# Patient Record
Sex: Male | Born: 1950
Health system: Southern US, Community
[De-identification: ages and names within clinical notes are randomized; demographics above are authoritative.]

## PROBLEM LIST (undated history)

## (undated) DIAGNOSIS — G4730 Sleep apnea, unspecified: Secondary | ICD-10-CM

## (undated) DIAGNOSIS — K649 Unspecified hemorrhoids: Secondary | ICD-10-CM

## (undated) DIAGNOSIS — I499 Cardiac arrhythmia, unspecified: Secondary | ICD-10-CM

## (undated) DIAGNOSIS — N189 Chronic kidney disease, unspecified: Secondary | ICD-10-CM

## (undated) DIAGNOSIS — I4891 Unspecified atrial fibrillation: Secondary | ICD-10-CM

## (undated) DIAGNOSIS — M1990 Unspecified osteoarthritis, unspecified site: Secondary | ICD-10-CM

## (undated) DIAGNOSIS — I10 Essential (primary) hypertension: Secondary | ICD-10-CM

## (undated) DIAGNOSIS — G629 Polyneuropathy, unspecified: Secondary | ICD-10-CM

## (undated) DIAGNOSIS — E119 Type 2 diabetes mellitus without complications: Secondary | ICD-10-CM

## (undated) DIAGNOSIS — K5900 Constipation, unspecified: Secondary | ICD-10-CM

## (undated) HISTORY — PX: ROTATOR CUFF REPAIR: SHX139

## (undated) HISTORY — PX: CARDIOVASCULAR STRESS TEST: SHX262

## (undated) HISTORY — PX: FOOT TENDON SURGERY: SHX958

---

## 2015-09-20 DIAGNOSIS — Z8042 Family history of malignant neoplasm of prostate: Secondary | ICD-10-CM | POA: Insufficient documentation

## 2015-09-20 DIAGNOSIS — R351 Nocturia: Secondary | ICD-10-CM | POA: Insufficient documentation

## 2015-09-20 DIAGNOSIS — N138 Other obstructive and reflux uropathy: Secondary | ICD-10-CM | POA: Insufficient documentation

## 2015-09-20 DIAGNOSIS — N401 Enlarged prostate with lower urinary tract symptoms: Secondary | ICD-10-CM

## 2016-02-19 DIAGNOSIS — E119 Type 2 diabetes mellitus without complications: Secondary | ICD-10-CM | POA: Diagnosis not present

## 2016-04-04 DIAGNOSIS — K921 Melena: Secondary | ICD-10-CM | POA: Diagnosis not present

## 2016-04-10 DIAGNOSIS — K649 Unspecified hemorrhoids: Secondary | ICD-10-CM | POA: Diagnosis not present

## 2016-04-10 DIAGNOSIS — I10 Essential (primary) hypertension: Secondary | ICD-10-CM | POA: Diagnosis not present

## 2016-04-10 DIAGNOSIS — K648 Other hemorrhoids: Secondary | ICD-10-CM | POA: Diagnosis not present

## 2016-04-10 DIAGNOSIS — F329 Major depressive disorder, single episode, unspecified: Secondary | ICD-10-CM | POA: Diagnosis not present

## 2016-04-10 DIAGNOSIS — F419 Anxiety disorder, unspecified: Secondary | ICD-10-CM | POA: Diagnosis not present

## 2016-04-10 DIAGNOSIS — D128 Benign neoplasm of rectum: Secondary | ICD-10-CM | POA: Diagnosis not present

## 2016-04-10 DIAGNOSIS — E1121 Type 2 diabetes mellitus with diabetic nephropathy: Secondary | ICD-10-CM | POA: Diagnosis not present

## 2016-04-10 DIAGNOSIS — K921 Melena: Secondary | ICD-10-CM | POA: Diagnosis not present

## 2016-04-10 DIAGNOSIS — Z87891 Personal history of nicotine dependence: Secondary | ICD-10-CM | POA: Diagnosis not present

## 2016-04-10 DIAGNOSIS — K5730 Diverticulosis of large intestine without perforation or abscess without bleeding: Secondary | ICD-10-CM | POA: Diagnosis not present

## 2016-04-10 DIAGNOSIS — E7800 Pure hypercholesterolemia, unspecified: Secondary | ICD-10-CM | POA: Diagnosis not present

## 2016-04-11 DIAGNOSIS — E119 Type 2 diabetes mellitus without complications: Secondary | ICD-10-CM | POA: Diagnosis not present

## 2016-04-11 DIAGNOSIS — H524 Presbyopia: Secondary | ICD-10-CM | POA: Diagnosis not present

## 2016-04-11 DIAGNOSIS — H2513 Age-related nuclear cataract, bilateral: Secondary | ICD-10-CM | POA: Diagnosis not present

## 2016-06-07 ENCOUNTER — Encounter: Payer: Self-pay | Admitting: Sports Medicine

## 2016-06-07 ENCOUNTER — Ambulatory Visit (INDEPENDENT_AMBULATORY_CARE_PROVIDER_SITE_OTHER): Payer: PPO

## 2016-06-07 ENCOUNTER — Ambulatory Visit (INDEPENDENT_AMBULATORY_CARE_PROVIDER_SITE_OTHER): Payer: PPO | Admitting: Sports Medicine

## 2016-06-07 DIAGNOSIS — M79671 Pain in right foot: Secondary | ICD-10-CM | POA: Diagnosis not present

## 2016-06-07 DIAGNOSIS — Z89421 Acquired absence of other right toe(s): Secondary | ICD-10-CM

## 2016-06-07 DIAGNOSIS — L03119 Cellulitis of unspecified part of limb: Secondary | ICD-10-CM | POA: Diagnosis not present

## 2016-06-07 DIAGNOSIS — L02619 Cutaneous abscess of unspecified foot: Secondary | ICD-10-CM

## 2016-06-07 DIAGNOSIS — L89891 Pressure ulcer of other site, stage 1: Secondary | ICD-10-CM

## 2016-06-07 DIAGNOSIS — E1142 Type 2 diabetes mellitus with diabetic polyneuropathy: Secondary | ICD-10-CM | POA: Diagnosis not present

## 2016-06-07 DIAGNOSIS — E11621 Type 2 diabetes mellitus with foot ulcer: Secondary | ICD-10-CM

## 2016-06-07 DIAGNOSIS — L97512 Non-pressure chronic ulcer of other part of right foot with fat layer exposed: Secondary | ICD-10-CM

## 2016-06-07 MED ORDER — SULFAMETHOXAZOLE-TRIMETHOPRIM 800-160 MG PO TABS: 1.0000 | ORAL_TABLET | Freq: Two times a day (BID) | ORAL | Status: DC

## 2016-06-07 NOTE — Progress Notes (Signed)
Patient ID: Aaron Mclean, male   DOB: [birth year], 65 y.o.   MRN: [id number] Subjective: Aaron Mclean is a 65 y.o. male patient seen in office for evaluation of ulceration of the Right foot. Patient has a history of diabetes and a blood glucose level today of 140 mg/dl.   Patient is changing the dressing not using anything; states over the last few months noticed the callus getting bigger and his dog has been licking it. States that he does a lot of standing and walking at work and admits that his shoes rubbed based on how his foot is shaped now after the amputations he has had. Denies nausea/fever/vomiting/chills/night sweats/shortness of breath/pain. Patient has no other pedal complaints at this time.  Patient Active Problem List   Diagnosis Date Noted  . Benign prostatic hyperplasia with urinary obstruction 09/20/2015  . Family history of malignant neoplasm of prostate 09/20/2015  . Excessive urination at night 09/20/2015   No current outpatient prescriptions on file prior to visit.   No current facility-administered medications on file prior to visit.   No Known Allergies  No results found for this or any previous visit (from the past 2160 hour(s)).  Objective: There were no vitals filed for this visit.  General: Patient is awake, alert, oriented x 3 and in no acute distress.  Dermatology: Skin is warm and dry bilateral with a full thickness ulceration present  Right lateral foot. Ulceration measures 3cm x 2 cm x 0.5 cm. There is a  keratotic border with a granular base. The ulceration probes close to bone. There is malodor, no active drainage, mild erythema, no edema. No other acute signs of infection.   Vascular: Dorsalis Pedis pulse = 2/4 Bilateral,  Posterior Tibial pulse = 1/4 Bilateral,  Capillary Fill Time < 5 seconds  Neurologic: Protective sensation absent using the 5.07/10g Semmes Weinstein Monofilament.  Musculosketal: No Pain with palpation to ulcerated area. No pain with  compression to calves bilateral. Right 3rd and 5th toe amputation status  Xrays,Right foot:Significant midfoot and ankle arthritic changes and sclerotic bone changes, amputation status, No obvious bony destruction suggestive of acute osteomyelitis. No gas in soft tissues.   Assessment and Plan:  Problem List Items Addressed This Visit    None    Visit Diagnoses    Right foot pain    -  Primary    Relevant Medications    sulfamethoxazole-trimethoprim (BACTRIM DS,SEPTRA DS) 800-160 MG tablet    Other Relevant Orders    DG Foot 2 Views Right    WOUND CULTURE    Diabetic ulcer of right foot associated with type 2 diabetes mellitus, with fat layer exposed ([place])        Relevant Medications    sulfamethoxazole-trimethoprim (BACTRIM DS,SEPTRA DS) 800-160 MG tablet    aspirin EC 81 MG tablet    lisinopril (PRINIVIL,ZESTRIL) 20 MG tablet    Other Relevant Orders    WOUND CULTURE    Cellulitis and abscess of foot        Relevant Medications    sulfamethoxazole-trimethoprim (BACTRIM DS,SEPTRA DS) 800-160 MG tablet    Other Relevant Orders    WOUND CULTURE    Diabetic polyneuropathy associated with type 2 diabetes mellitus ([facility])        Relevant Medications    aspirin EC 81 MG tablet    lisinopril (PRINIVIL,ZESTRIL) 20 MG tablet    Other Relevant Orders    WOUND CULTURE    Status post amputation of toe of right  foot ([place])        3rd and 5th    Relevant Orders    WOUND CULTURE      -Examined patient and discussed the progression of the wound and treatment alternatives. -Xrays reviewed - Excisionally dedbrided ulceration to healthy bleeding borders using a sterile chisel  Blade. Wound culture obtained and sent to [facility]. -Applied iodosorb and dry sterile dressing and instructed patient to continue with daily dressings at home consisting of the same; provided patient with the remaining medication. -Rx Bactrim -Dispensed post op shoe to wear at all times; advised patient that if work does not  allow post op shoe then will not be able to work until ulceration is better - Advised patient to go to the ER or return to office if the wound worsens or if constitutional symptoms are present. -Patient to return to office in 1 week for follow up care and evaluation or sooner if problems arise.  [name], DPM

## 2016-06-15 ENCOUNTER — Ambulatory Visit (INDEPENDENT_AMBULATORY_CARE_PROVIDER_SITE_OTHER): Payer: PPO | Admitting: Sports Medicine

## 2016-06-15 ENCOUNTER — Encounter: Payer: Self-pay | Admitting: Sports Medicine

## 2016-06-15 DIAGNOSIS — L89891 Pressure ulcer of other site, stage 1: Secondary | ICD-10-CM

## 2016-06-15 DIAGNOSIS — M79671 Pain in right foot: Secondary | ICD-10-CM

## 2016-06-15 DIAGNOSIS — L97511 Non-pressure chronic ulcer of other part of right foot limited to breakdown of skin: Secondary | ICD-10-CM

## 2016-06-15 DIAGNOSIS — E1142 Type 2 diabetes mellitus with diabetic polyneuropathy: Secondary | ICD-10-CM

## 2016-06-15 DIAGNOSIS — E11621 Type 2 diabetes mellitus with foot ulcer: Secondary | ICD-10-CM

## 2016-06-15 DIAGNOSIS — Z89421 Acquired absence of other right toe(s): Secondary | ICD-10-CM

## 2016-06-15 DIAGNOSIS — L02619 Cutaneous abscess of unspecified foot: Secondary | ICD-10-CM

## 2016-06-15 DIAGNOSIS — L03119 Cellulitis of unspecified part of limb: Secondary | ICD-10-CM

## 2016-06-15 NOTE — Progress Notes (Signed)
Patient ID: Aaron Mclean, male   DOB: 05-Oct-1951, 65 y.o.   MRN: [id number]   Subjective: Aaron Mclean is a 65 y.o. male patient seen in office for follow up evaluation of ulceration of the Right foot. Patient has a history of diabetes and a blood glucose level today not recorded.   Patient is changing the dressing using iodosorb. Patient is taking Bactrim with no problems. Denies nausea/fever/vomiting/chills/night sweats/shortness of breath/pain. Patient has no other pedal complaints at this time.  Patient Active Problem List   Diagnosis Date Noted  . Benign prostatic hyperplasia with urinary obstruction 09/20/2015  . Family history of malignant neoplasm of prostate 09/20/2015  . Excessive urination at night 09/20/2015   Current Outpatient Prescriptions on File Prior to Visit  Medication Sig Dispense Refill  . aspirin EC 81 MG tablet Take by mouth.    Marland Kitchen atenolol (TENORMIN) 50 MG tablet Take 50 mg by mouth.    . Cholecalciferol (VITAMIN D-1000 MAX ST) 1000 units tablet Take by mouth.    Marland Kitchen lisinopril (PRINIVIL,ZESTRIL) 20 MG tablet Take 20 mg by mouth.    . Omega-3 Fatty Acids (FISH OIL) 1000 MG CAPS Take by mouth.    . sulfamethoxazole-trimethoprim (BACTRIM DS,SEPTRA DS) 800-160 MG tablet Take 1 tablet by mouth 2 (two) times daily. 28 tablet 0   No current facility-administered medications on file prior to visit.   No Known Allergies  No results found for this or any previous visit (from the past 2160 hour(s)).  Objective: There were no vitals filed for this visit.  General: Patient is awake, alert, oriented x 3 and in no acute distress.  Dermatology: Skin is warm and dry bilateral with a full thickness ulceration present  Right lateral foot. Ulceration measures 1 x 1 cm x 0.2 cm in depth (last measurement 3cm x 2 cm x 0.5 cm). There is a keratotic border with a granular base. The ulceration no longer probes close to bone. There is malodor, no active drainage, decreased erythema, no  edema. No other acute signs of infection.   Vascular: Dorsalis Pedis pulse = 2/4 Bilateral,  Posterior Tibial pulse = 1/4 Bilateral,  Capillary Fill Time < 5 seconds  Neurologic: Protective sensation absent using the 5.07/10g Semmes Weinstein Monofilament.  Musculosketal: No Pain with palpation to ulcerated area. No pain with compression to calves bilateral. Right 3rd and 5th toe amputation status  Assessment and Plan:  Problem List Items Addressed This Visit    None    Visit Diagnoses    Right foot pain    -  Primary    Diabetic ulcer of right foot, limited to breakdown of skin ([place])        improving    Cellulitis and abscess of foot        improved    Diabetic polyneuropathy associated with type 2 diabetes mellitus ([place])        Status post amputation of toe of right foot ([place])          -Examined patient and discussed the progression of the wound and treatment alternatives. - Excisionally dedbrided ulceration to healthy bleeding borders using a sterile chisel  Blade.  -Applied iodosorb and dry sterile dressing and instructed patient to continue with daily dressings at home consisting of the same. -Continue with Bactrim; Wound culture Results reviewed with patient- Positive for Escherichia coli and staph Schleiferi -Continue with post op shoe to wear at all times; work note given to patient for light duty, no  prolonged standing or walking beyond 15 minutes, no heavy lifting beyond 20 pounds. Patient must wear her postop shoe at all times on right foot - Advised patient to go to the ER or return to office if the wound worsens or if constitutional symptoms are present. -Patient to return to office in 1 week for follow up care and evaluation or sooner if problems arise.  [name], DPM

## 2016-06-22 ENCOUNTER — Ambulatory Visit (INDEPENDENT_AMBULATORY_CARE_PROVIDER_SITE_OTHER): Payer: PPO | Admitting: Sports Medicine

## 2016-06-22 ENCOUNTER — Encounter: Payer: Self-pay | Admitting: Sports Medicine

## 2016-06-22 DIAGNOSIS — E1142 Type 2 diabetes mellitus with diabetic polyneuropathy: Secondary | ICD-10-CM

## 2016-06-22 DIAGNOSIS — E11621 Type 2 diabetes mellitus with foot ulcer: Secondary | ICD-10-CM | POA: Diagnosis not present

## 2016-06-22 DIAGNOSIS — L03119 Cellulitis of unspecified part of limb: Secondary | ICD-10-CM

## 2016-06-22 DIAGNOSIS — M79671 Pain in right foot: Secondary | ICD-10-CM | POA: Diagnosis not present

## 2016-06-22 DIAGNOSIS — Z89421 Acquired absence of other right toe(s): Secondary | ICD-10-CM | POA: Diagnosis not present

## 2016-06-22 DIAGNOSIS — L89891 Pressure ulcer of other site, stage 1: Secondary | ICD-10-CM | POA: Diagnosis not present

## 2016-06-22 DIAGNOSIS — L97511 Non-pressure chronic ulcer of other part of right foot limited to breakdown of skin: Principal | ICD-10-CM

## 2016-06-22 DIAGNOSIS — L02619 Cutaneous abscess of unspecified foot: Secondary | ICD-10-CM

## 2016-06-22 NOTE — Progress Notes (Signed)
Patient ID: Aaron Mclean, male   DOB: Jun 16, 1951, 65 y.o.   MRN: [id number]  Subjective: Aaron Mclean is a 65 y.o. male patient seen in office for follow up evaluation of ulceration of the Right foot. Patient has a history of diabetes and a blood glucose level today not recorded.   Patient is changing the dressing using iodosorb. Patient is taking Bactrim with no problems. Denies nausea/fever/vomiting/chills/night sweats/shortness of breath/pain. Patient has no other pedal complaints at this time.  Patient Active Problem List   Diagnosis Date Noted  . Benign prostatic hyperplasia with urinary obstruction 09/20/2015  . Family history of malignant neoplasm of prostate 09/20/2015  . Excessive urination at night 09/20/2015   Current Outpatient Prescriptions on File Prior to Visit  Medication Sig Dispense Refill  . aspirin EC 81 MG tablet Take by mouth.    Marland Kitchen atenolol (TENORMIN) 50 MG tablet Take 50 mg by mouth.    . Cholecalciferol (VITAMIN D-1000 MAX ST) 1000 units tablet Take by mouth.    Marland Kitchen lisinopril (PRINIVIL,ZESTRIL) 20 MG tablet Take 20 mg by mouth.    . Omega-3 Fatty Acids (FISH OIL) 1000 MG CAPS Take by mouth.    . sulfamethoxazole-trimethoprim (BACTRIM DS,SEPTRA DS) 800-160 MG tablet Take 1 tablet by mouth 2 (two) times daily. 28 tablet 0   No current facility-administered medications on file prior to visit.   No Known Allergies  No results found for this or any previous visit (from the past 2160 hour(s)).  Objective: There were no vitals filed for this visit.  General: Patient is awake, alert, oriented x 3 and in no acute distress.  Dermatology: Skin is warm and dry bilateral with a now partial thickness ulceration present  Right lateral foot. Ulceration measures 0.5 x 0.5 cm x 0.2 cm in depth (last measurement 1cm x 1 cm x 0.2cm). There is a keratotic border with a granular base. The ulceration no longer probes. There is no malodor, no active drainage, no erythema, no edema. No  other acute signs of infection.   Vascular: Dorsalis Pedis pulse = 2/4 Bilateral,  Posterior Tibial pulse = 1/4 Bilateral,  Capillary Fill Time < 5 seconds  Neurologic: Protective sensation absent using the 5.07/10g Semmes Weinstein Monofilament.  Musculosketal: No Pain with palpation to ulcerated area. No pain with compression to calves bilateral. Right 3rd and 5th toe amputation status  Assessment and Plan:  Problem List Items Addressed This Visit    None    Visit Diagnoses    Diabetic ulcer of right foot, limited to breakdown of skin ([place])    -  Primary    Right foot pain        Diabetic polyneuropathy associated with type 2 diabetes mellitus ([place])        Status post amputation of toe of right foot ([place])        Cellulitis and abscess of foot        Improved      -Examined patient and discussed the progression of the wound and treatment alternatives. -Excisionally dedbrided ulceration to healthy bleeding borders using a sterile chisel  Blade.  -Applied iodosorb and dry sterile dressing and instructed patient to continue with every other day dressing changes at home consisting of the same. -Continue with Bactrim until completed -Continue with post op shoe to wear at all times; work note continue light duty, no prolonged standing or walking beyond 15 minutes, no heavy lifting beyond 20 pounds. Patient must wear her postop shoe  at all times on right foot - Advised patient to go to the ER or return to office if the wound worsens or if constitutional symptoms are present. -Patient to return to office in 2 weeks for follow up care and evaluation or sooner if problems arise.  Aaron Mclean, DPM

## 2016-06-29 ENCOUNTER — Telehealth: Payer: Self-pay | Admitting: *Deleted

## 2016-06-29 MED ORDER — LEVOFLOXACIN 500 MG PO TABS: 500.0000 mg | ORAL_TABLET | Freq: Every day | ORAL | Status: DC

## 2016-06-29 NOTE — Telephone Encounter (Addendum)
Pt states has finished his antibiotics and there is not a lot of change in the side of his right foot, and it is still draining. I told pt I would inform Dr. Cannon Kettle of his status and a call with instructions. Dr. Cannon Kettle ordered Levaquin 500mg  #14 one po daily. Orders to pt and to [facility].

## 2016-06-29 NOTE — Telephone Encounter (Signed)
Rx Levaquin 500mg  daily x 14 days

## 2016-07-06 ENCOUNTER — Ambulatory Visit (INDEPENDENT_AMBULATORY_CARE_PROVIDER_SITE_OTHER): Payer: PPO | Admitting: Sports Medicine

## 2016-07-06 ENCOUNTER — Encounter: Payer: Self-pay | Admitting: Sports Medicine

## 2016-07-06 DIAGNOSIS — L97511 Non-pressure chronic ulcer of other part of right foot limited to breakdown of skin: Principal | ICD-10-CM

## 2016-07-06 DIAGNOSIS — E11621 Type 2 diabetes mellitus with foot ulcer: Secondary | ICD-10-CM

## 2016-07-06 DIAGNOSIS — L89891 Pressure ulcer of other site, stage 1: Secondary | ICD-10-CM

## 2016-07-06 DIAGNOSIS — M79671 Pain in right foot: Secondary | ICD-10-CM

## 2016-07-06 DIAGNOSIS — Z89421 Acquired absence of other right toe(s): Secondary | ICD-10-CM

## 2016-07-06 DIAGNOSIS — E1142 Type 2 diabetes mellitus with diabetic polyneuropathy: Secondary | ICD-10-CM

## 2016-07-06 NOTE — Progress Notes (Signed)
Patient ID: JERAMIA LAGRIMAS, male   DOB: October 25, 1951, 65 y.o.   MRN: [id number]   Subjective: DEVIN MARSCHALL is a 65 y.o. male patient seen in office for follow up evaluation of ulceration of the Right foot. Patient has a history of diabetes and a blood glucose level today not recorded.   Patient is changing the dressing using iodosorb and offloading pad. Patient is on Levaquin, had episode of headache so skipped a few days of the medicine but reports drainage and pain went away immediately when taking. Denies nausea/fever/vomiting/chills/night sweats/shortness of breath/pain. Patient has no other pedal complaints at this time.  Patient Active Problem List   Diagnosis Date Noted  . Benign prostatic hyperplasia with urinary obstruction 09/20/2015  . Family history of malignant neoplasm of prostate 09/20/2015  . Excessive urination at night 09/20/2015   Current Outpatient Prescriptions on File Prior to Visit  Medication Sig Dispense Refill  . aspirin EC 81 MG tablet Take by mouth.    Marland Kitchen atenolol (TENORMIN) 50 MG tablet Take 50 mg by mouth.    . Cholecalciferol (VITAMIN D-1000 MAX ST) 1000 units tablet Take by mouth.    . levofloxacin (LEVAQUIN) 500 MG tablet Take 1 tablet (500 mg total) by mouth daily. 14 tablet 0  . lisinopril (PRINIVIL,ZESTRIL) 20 MG tablet Take 20 mg by mouth.    . Omega-3 Fatty Acids (FISH OIL) 1000 MG CAPS Take by mouth.    . sulfamethoxazole-trimethoprim (BACTRIM DS,SEPTRA DS) 800-160 MG tablet Take 1 tablet by mouth 2 (two) times daily. 28 tablet 0   No current facility-administered medications on file prior to visit.    No Known Allergies  No results found for this or any previous visit (from the past 2160 hour(s)).  Objective: There were no vitals filed for this visit.  General: Patient is awake, alert, oriented x 3 and in no acute distress.  Dermatology: Skin is warm and dry bilateral with a full thickness ulceration present  Right lateral foot. Ulceration measures  0.5 x 0.5 cm x 0.2 cm in depth (last measurement same). There is a keratotic border with a fibrogranular base. The ulceration does not probe to bone. There is malodor, no active drainage, decreased erythema, no edema. No other acute signs of infection.   Vascular: Dorsalis Pedis pulse = 2/4 Bilateral,  Posterior Tibial pulse = 1/4 Bilateral,  Capillary Fill Time < 5 seconds  Neurologic: Protective sensation absent using the 5.07/10g Semmes Weinstein Monofilament.  Musculosketal: No Pain with palpation to ulcerated area. No pain with compression to calves bilateral. Right 3rd and 5th toe amputation status, fixed cavovarus foot.  Assessment and Plan:  Problem List Items Addressed This Visit    None    Visit Diagnoses    Diabetic ulcer of right foot, limited to breakdown of skin ([place])    -  Primary   Right foot pain       Diabetic polyneuropathy associated with type 2 diabetes mellitus ([place])       Status post amputation of toe of right foot ([place])         -Examined patient and discussed the progression of the wound and treatment alternatives. - Excisionally dedbrided ulceration to healthy bleeding borders using a sterile chisel  Blade.  -Applied offloading pad. Iodosorb, and dry sterile dressing and instructed patient to continue with daily dressings at home consisting of the same. -Advised patient not to soak may use antibacterial soap or alcohol to cleanse are -Continue with Levaquin as tolerated -  Continue with wider shoe or post op shoe to wear at all times; pt reports that his dog chewed up his shoe. Continue with limited work and no excessive walking or standing to prevent worsening of ulceration - Advised patient to go to the ER or return to office if the wound worsens or if constitutional symptoms are present. -Patient to return to office in 2 weeks for follow up ulcer care and evaluation or sooner if problems arise.  [name], DPM

## 2016-07-20 ENCOUNTER — Ambulatory Visit (INDEPENDENT_AMBULATORY_CARE_PROVIDER_SITE_OTHER): Payer: PPO | Admitting: Sports Medicine

## 2016-07-20 ENCOUNTER — Encounter: Payer: Self-pay | Admitting: Sports Medicine

## 2016-07-20 DIAGNOSIS — L89891 Pressure ulcer of other site, stage 1: Secondary | ICD-10-CM | POA: Diagnosis not present

## 2016-07-20 DIAGNOSIS — L97511 Non-pressure chronic ulcer of other part of right foot limited to breakdown of skin: Principal | ICD-10-CM

## 2016-07-20 DIAGNOSIS — M79671 Pain in right foot: Secondary | ICD-10-CM

## 2016-07-20 DIAGNOSIS — E11621 Type 2 diabetes mellitus with foot ulcer: Secondary | ICD-10-CM | POA: Diagnosis not present

## 2016-07-20 DIAGNOSIS — Z89421 Acquired absence of other right toe(s): Secondary | ICD-10-CM

## 2016-07-20 DIAGNOSIS — E1142 Type 2 diabetes mellitus with diabetic polyneuropathy: Secondary | ICD-10-CM

## 2016-07-20 MED ORDER — SILVER SULFADIAZINE 1 % EX CREA: 1.0000 "application " | TOPICAL_CREAM | Freq: Every day | CUTANEOUS | 1 refills | Status: DC

## 2016-07-20 NOTE — Progress Notes (Signed)
Patient ID: Aaron Mclean, male   DOB: 1951/11/14, 65 y.o.   MRN: [id number]   Subjective: Aaron Mclean is a 65 y.o. male patient seen in office for follow up evaluation of ulceration of the Right foot. Patient has a history of diabetes and a blood glucose level today not recorded.   Patient is changing the dressing using iodosorb and offloading pad up until yesterday, ran out of cream. Patient also desires diabetic shoes. Denies nausea/fever/vomiting/chills/night sweats/shortness of breath/pain. Patient has no other pedal complaints at this time.  Completed Levaquin  Patient Active Problem List   Diagnosis Date Noted  . Benign prostatic hyperplasia with urinary obstruction 09/20/2015  . Family history of malignant neoplasm of prostate 09/20/2015  . Excessive urination at night 09/20/2015   Current Outpatient Prescriptions on File Prior to Visit  Medication Sig Dispense Refill  . aspirin EC 81 MG tablet Take by mouth.    Marland Kitchen atenolol (TENORMIN) 50 MG tablet Take 50 mg by mouth.    . Cholecalciferol (VITAMIN D-1000 MAX ST) 1000 units tablet Take by mouth.    . levofloxacin (LEVAQUIN) 500 MG tablet Take 1 tablet (500 mg total) by mouth daily. 14 tablet 0  . lisinopril (PRINIVIL,ZESTRIL) 20 MG tablet Take 20 mg by mouth.    . Omega-3 Fatty Acids (FISH OIL) 1000 MG CAPS Take by mouth.    . sulfamethoxazole-trimethoprim (BACTRIM DS,SEPTRA DS) 800-160 MG tablet Take 1 tablet by mouth 2 (two) times daily. 28 tablet 0   No current facility-administered medications on file prior to visit.    No Known Allergies  No results found for this or any previous visit (from the past 2160 hour(s)).  Objective: There were no vitals filed for this visit.  General: Patient is awake, alert, oriented x 3 and in no acute distress.  Dermatology: Skin is warm and dry bilateral with a full thickness ulceration present  Right lateral foot. Ulceration measures 0.3 x 0.3 cm x 0.2 cm in depth (last measurement 0.5 x  0.5 x 0.2 cm). There is a keratotic border with a fibrogranular base. The ulceration does not probe to bone. There is malodor, no active drainage, decreased erythema, no edema. No other acute signs of infection.   Vascular: Dorsalis Pedis pulse = 2/4 Bilateral,  Posterior Tibial pulse = 1/4 Bilateral,  Capillary Fill Time < 5 seconds  Neurologic: Protective sensation absent using the 5.07/10g Semmes Weinstein Monofilament.  Musculosketal: No Pain with palpation to ulcerated area. No pain with compression to calves bilateral. Right 3rd and 5th toe amputation status, fixed cavovarus foot.  Assessment and Plan:  Problem List Items Addressed This Visit    None    Visit Diagnoses    Diabetic ulcer of right foot, limited to breakdown of skin ([place])    -  Primary   Right foot pain       Diabetic polyneuropathy associated with type 2 diabetes mellitus ([place])       Status post amputation of toe of right foot ([place])         -Examined patient and discussed the progression of the wound and treatment alternatives. - Excisionally dedbrided ulceration to healthy bleeding borders using a sterile chisel  Blade.  -Applied offloading pad, Silvadene cream and dry sterile dressing and instructed patient to continue with daily dressings at home consisting of the same. Ordered wound dressings for patient from [facility]. -Advised patient not to soak may use antibacterial soap or alcohol to cleanse are -Continue with wider  shoe or post op shoe to wear at all times; pt reports that his dog chewed up his shoe. Continue with limited work and no excessive walking or standing to prevent worsening of ulceration. [employer] diabetic shoe order form was completed; office to contact primary care for approval / certification;  Office to arrange shoe fitting and dispensing. Patient to call us with the name of his primary care doctor once he is seen. - Advised patient to go to the ER or return to office if the wound worsens or if  constitutional symptoms are present. -Work light to no duty until ulceration is improved -Patient to return to office in 2 weeks for follow up ulcer care and evaluation or sooner if problems arise.  [name], DPM

## 2016-07-21 DIAGNOSIS — E1159 Type 2 diabetes mellitus with other circulatory complications: Secondary | ICD-10-CM | POA: Diagnosis not present

## 2016-07-21 DIAGNOSIS — Z7689 Persons encountering health services in other specified circumstances: Secondary | ICD-10-CM | POA: Diagnosis not present

## 2016-07-21 DIAGNOSIS — E119 Type 2 diabetes mellitus without complications: Secondary | ICD-10-CM | POA: Diagnosis not present

## 2016-07-21 DIAGNOSIS — K5900 Constipation, unspecified: Secondary | ICD-10-CM | POA: Diagnosis not present

## 2016-07-27 DIAGNOSIS — E08621 Diabetes mellitus due to underlying condition with foot ulcer: Secondary | ICD-10-CM | POA: Diagnosis not present

## 2016-07-27 DIAGNOSIS — E1159 Type 2 diabetes mellitus with other circulatory complications: Secondary | ICD-10-CM | POA: Diagnosis not present

## 2016-07-27 DIAGNOSIS — R809 Proteinuria, unspecified: Secondary | ICD-10-CM | POA: Diagnosis not present

## 2016-07-27 DIAGNOSIS — R351 Nocturia: Secondary | ICD-10-CM | POA: Diagnosis not present

## 2016-07-27 DIAGNOSIS — E119 Type 2 diabetes mellitus without complications: Secondary | ICD-10-CM | POA: Diagnosis not present

## 2016-07-27 DIAGNOSIS — I10 Essential (primary) hypertension: Secondary | ICD-10-CM | POA: Diagnosis not present

## 2016-08-03 ENCOUNTER — Ambulatory Visit (INDEPENDENT_AMBULATORY_CARE_PROVIDER_SITE_OTHER): Payer: PPO | Admitting: Sports Medicine

## 2016-08-03 ENCOUNTER — Encounter: Payer: Self-pay | Admitting: Sports Medicine

## 2016-08-03 DIAGNOSIS — E11621 Type 2 diabetes mellitus with foot ulcer: Secondary | ICD-10-CM | POA: Diagnosis not present

## 2016-08-03 DIAGNOSIS — L97511 Non-pressure chronic ulcer of other part of right foot limited to breakdown of skin: Principal | ICD-10-CM

## 2016-08-03 DIAGNOSIS — L89891 Pressure ulcer of other site, stage 1: Secondary | ICD-10-CM | POA: Diagnosis not present

## 2016-08-03 DIAGNOSIS — Z89421 Acquired absence of other right toe(s): Secondary | ICD-10-CM

## 2016-08-03 DIAGNOSIS — E1142 Type 2 diabetes mellitus with diabetic polyneuropathy: Secondary | ICD-10-CM

## 2016-08-03 DIAGNOSIS — M79671 Pain in right foot: Secondary | ICD-10-CM

## 2016-08-03 NOTE — Progress Notes (Addendum)
Patient ID: Aaron Mclean, male   DOB: 12-Mar-1951, 65 y.o.   MRN: [id number]   Subjective: Aaron Mclean is a 65 y.o. Diabetic male patient seen in office for follow up evaluation of ulceration of the Right foot. Patient has a history of diabetes and a blood glucose level today was 145mg /dl.   Patient is changing the dressing using silvadene cream. Denies nausea/fever/vomiting/chills/night sweats/shortness of breath/pain. Reports his PCP now is Dr. [name] who is helping to get his blood pressure and diabetes under control. Patient has no other pedal complaints at this time.   Patient Active Problem List   Diagnosis Date Noted  . Benign prostatic hyperplasia with urinary obstruction 09/20/2015  . Family history of malignant neoplasm of prostate 09/20/2015  . Excessive urination at night 09/20/2015   Current Outpatient Prescriptions on File Prior to Visit  Medication Sig Dispense Refill  . aspirin EC 81 MG tablet Take by mouth.    Marland Kitchen atenolol (TENORMIN) 50 MG tablet Take 50 mg by mouth.    . Cholecalciferol (VITAMIN D-1000 MAX ST) 1000 units tablet Take by mouth.    . levofloxacin (LEVAQUIN) 500 MG tablet Take 1 tablet (500 mg total) by mouth daily. 14 tablet 0  . lisinopril (PRINIVIL,ZESTRIL) 20 MG tablet Take 20 mg by mouth.    . Omega-3 Fatty Acids (FISH OIL) 1000 MG CAPS Take by mouth.    . silver sulfADIAZINE (SILVADENE) 1 % cream Apply 1 application topically daily. To right foot ulcer 50 g 1  . sulfamethoxazole-trimethoprim (BACTRIM DS,SEPTRA DS) 800-160 MG tablet Take 1 tablet by mouth 2 (two) times daily. 28 tablet 0   No current facility-administered medications on file prior to visit.    No Known Allergies  No results found for this or any previous visit (from the past 2160 hour(s)).  Objective: There were no vitals filed for this visit.  General: Patient is awake, alert, oriented x 3 and in no acute distress.  Dermatology: Skin is warm and dry bilateral with a full thickness  ulceration present Right lateral foot. Ulceration measures 0.5 x 0.3 cm x 0.2 cm in depth (last measurement 0.3 x 0.3 cm x 0.2cm). There is a keratotic border with a fibrogranular base. The ulceration does not probe to bone. There is no malodor, no active drainage, noerythema, no edema. No other acute signs of infection.   Vascular: Dorsalis Pedis pulse = 2/4 Bilateral,  Posterior Tibial pulse = 1/4 Bilateral,  Capillary Fill Time < 5 seconds  Neurologic: Protective sensation absent using the 5.07/10g Semmes Weinstein Monofilament.  Musculosketal: No Pain with palpation to ulcerated area. No pain with compression to calves bilateral. Right 3rd and 5th toe amputation status, fixed cavovarus foot.  Assessment and Plan:  Problem List Items Addressed This Visit    None    Visit Diagnoses    Diabetic ulcer of right foot, limited to breakdown of skin ([place])    -  Primary   Right foot pain       Diabetic polyneuropathy associated with type 2 diabetes mellitus ([place])       Status post amputation of toe of right foot ([place])         -Examined patient and discussed the progression of the wound and treatment alternatives. - Excisionally dedbrided ulceration to healthy bleeding borders using a sterile chisel Blade.  -Applied offloading pad, Silvadene cream and dry sterile dressing and instructed patient to continue with daily dressings at home consisting of the same. -Continue with  wider shoe or post op shoe to wear at all times; Continue with limited work and no excessive walking or standing to prevent worsening of ulceration.  -Patient is awaiting Diabetic shoes - Advised patient to go to the ER or return to office if the wound worsens or if constitutional symptoms are present. -Work light to no duty until ulceration is improved -Patient to return to office in 2 weeks for follow up ulcer care and evaluation or sooner if problems arise.  [name], DPM

## 2016-08-10 DIAGNOSIS — E1159 Type 2 diabetes mellitus with other circulatory complications: Secondary | ICD-10-CM | POA: Diagnosis not present

## 2016-08-10 DIAGNOSIS — E119 Type 2 diabetes mellitus without complications: Secondary | ICD-10-CM | POA: Diagnosis not present

## 2016-08-10 DIAGNOSIS — E785 Hyperlipidemia, unspecified: Secondary | ICD-10-CM | POA: Diagnosis not present

## 2016-08-10 DIAGNOSIS — I10 Essential (primary) hypertension: Secondary | ICD-10-CM | POA: Diagnosis not present

## 2016-08-17 ENCOUNTER — Ambulatory Visit (INDEPENDENT_AMBULATORY_CARE_PROVIDER_SITE_OTHER): Payer: PPO | Admitting: Sports Medicine

## 2016-08-17 ENCOUNTER — Encounter: Payer: Self-pay | Admitting: Sports Medicine

## 2016-08-17 VITALS — BP 170/89 | HR 80 | Resp 18 | Ht 75.0 in | Wt 221.0 lb

## 2016-08-17 DIAGNOSIS — L89891 Pressure ulcer of other site, stage 1: Secondary | ICD-10-CM

## 2016-08-17 DIAGNOSIS — E1142 Type 2 diabetes mellitus with diabetic polyneuropathy: Secondary | ICD-10-CM

## 2016-08-17 DIAGNOSIS — M79671 Pain in right foot: Secondary | ICD-10-CM

## 2016-08-17 DIAGNOSIS — L97511 Non-pressure chronic ulcer of other part of right foot limited to breakdown of skin: Principal | ICD-10-CM

## 2016-08-17 DIAGNOSIS — E11621 Type 2 diabetes mellitus with foot ulcer: Secondary | ICD-10-CM

## 2016-08-17 DIAGNOSIS — Z89421 Acquired absence of other right toe(s): Secondary | ICD-10-CM

## 2016-08-17 NOTE — Progress Notes (Signed)
Patient ID: Aaron Mclean, male   DOB: 15-Apr-1951, 65 y.o.   MRN: [id number]   Subjective: Aaron Mclean is a 65 y.o. Diabetic male patient seen in office for follow up evaluation of ulceration of the Right foot. Patient has a history of diabetes and a blood glucose level today not recorded. Patient is changing the dressing using silvadene cream every 3 days with drainage that was better once he changed his dressing. Reports dog ate his shoe and some of his dressings. Denies nausea/fever/vomiting/chills/night sweats/shortness of breath/pain. Patient has no other pedal complaints at this time.   Patient Active Problem List   Diagnosis Date Noted  . Benign prostatic hyperplasia with urinary obstruction 09/20/2015  . Family history of malignant neoplasm of prostate 09/20/2015  . Excessive urination at night 09/20/2015   Current Outpatient Prescriptions on File Prior to Visit  Medication Sig Dispense Refill  . aspirin EC 81 MG tablet Take by mouth.    Marland Kitchen atenolol (TENORMIN) 50 MG tablet Take 50 mg by mouth.    . Cholecalciferol (VITAMIN D-1000 MAX ST) 1000 units tablet Take by mouth.    . levofloxacin (LEVAQUIN) 500 MG tablet Take 1 tablet (500 mg total) by mouth daily. 14 tablet 0  . lisinopril (PRINIVIL,ZESTRIL) 20 MG tablet Take 20 mg by mouth.    . Omega-3 Fatty Acids (FISH OIL) 1000 MG CAPS Take by mouth.    . silver sulfADIAZINE (SILVADENE) 1 % cream Apply 1 application topically daily. To right foot ulcer 50 g 1  . sulfamethoxazole-trimethoprim (BACTRIM DS,SEPTRA DS) 800-160 MG tablet Take 1 tablet by mouth 2 (two) times daily. 28 tablet 0   No current facility-administered medications on file prior to visit.    No Known Allergies  No results found for this or any previous visit (from the past 2160 hour(s)).  Objective: Vitals:   08/17/16 1340  Weight: 221 lb (100.2 kg)  Height: 6\' 3"  (1.905 m)    General: Patient is awake, alert, oriented x 3 and in no acute  distress.  Dermatology: Skin is warm and dry bilateral with a full thickness ulceration present Right lateral foot. Ulceration measures 0.8 x 0.8 cm x 0.2 cm in depth (last measurement 0.5 x 0.3 cm x 0.2cm). There is a keratotic border with a fibrogranular base. The ulceration does not probe to bone. There is no malodor, no active drainage, noerythema, no edema. No other acute signs of infection.   Vascular: Dorsalis Pedis pulse = 2/4 Bilateral,  Posterior Tibial pulse = 1/4 Bilateral,  Capillary Fill Time < 5 seconds  Neurologic: Protective sensation absent using the 5.07/10g Semmes Weinstein Monofilament.  Musculosketal: No Pain with palpation to ulcerated area. No pain with compression to calves bilateral. Right 3rd and 5th toe amputation status, fixed cavovarus foot.  Assessment and Plan:  Problem List Items Addressed This Visit    None    Visit Diagnoses    Diabetic ulcer of right foot, limited to breakdown of skin ([place])    -  Primary   Relevant Medications   amLODipine-olmesartan (AZOR) 10-40 MG tablet   atorvastatin (LIPITOR) 10 MG tablet   losartan-hydrochlorothiazide (HYZAAR) 100-25 MG tablet   pioglitazone (ACTOS) 15 MG tablet   Right foot pain       Diabetic polyneuropathy associated with type 2 diabetes mellitus (HCC)       Relevant Medications   amLODipine-olmesartan (AZOR) 10-40 MG tablet   atorvastatin (LIPITOR) 10 MG tablet   losartan-hydrochlorothiazide (HYZAAR) 100-25 MG tablet  pioglitazone (ACTOS) 15 MG tablet   Status post amputation of toe of right foot ([place])         -Examined patient and discussed the progression of the wound and treatment alternatives. - Excisionally dedbrided ulceration to healthy bleeding borders using a sterile chisel Blade.  -Applied offloading pad, Iodosorb cream and dry sterile dressing and instructed patient to continue with daily dressings at home consisting of Silvadene every other day. Advised patient to make sure he changes the  dressing as instructed. -Replacement post op shoe given to wear at all times; Continue with no excessive walking or standing to prevent worsening of ulceration.  -Patient is awaiting Diabetic shoes - Advised patient to go to the ER or return to office if the wound worsens or if constitutional symptoms are present. -Work light to no duty until ulceration is improved -Patient to return to office in 2 weeks for follow up ulcer care and evaluation or sooner if problems arise.  [name], DPM

## 2016-08-24 DIAGNOSIS — E119 Type 2 diabetes mellitus without complications: Secondary | ICD-10-CM | POA: Diagnosis not present

## 2016-08-24 DIAGNOSIS — E1159 Type 2 diabetes mellitus with other circulatory complications: Secondary | ICD-10-CM | POA: Diagnosis not present

## 2016-08-24 DIAGNOSIS — E08621 Diabetes mellitus due to underlying condition with foot ulcer: Secondary | ICD-10-CM | POA: Diagnosis not present

## 2016-08-24 DIAGNOSIS — I10 Essential (primary) hypertension: Secondary | ICD-10-CM | POA: Diagnosis not present

## 2016-08-31 ENCOUNTER — Ambulatory Visit (INDEPENDENT_AMBULATORY_CARE_PROVIDER_SITE_OTHER): Payer: PPO | Admitting: Sports Medicine

## 2016-08-31 ENCOUNTER — Encounter: Payer: Self-pay | Admitting: Sports Medicine

## 2016-08-31 DIAGNOSIS — Z89421 Acquired absence of other right toe(s): Secondary | ICD-10-CM

## 2016-08-31 DIAGNOSIS — L03119 Cellulitis of unspecified part of limb: Secondary | ICD-10-CM

## 2016-08-31 DIAGNOSIS — M79671 Pain in right foot: Secondary | ICD-10-CM

## 2016-08-31 DIAGNOSIS — E11621 Type 2 diabetes mellitus with foot ulcer: Secondary | ICD-10-CM | POA: Diagnosis not present

## 2016-08-31 DIAGNOSIS — L02619 Cutaneous abscess of unspecified foot: Secondary | ICD-10-CM

## 2016-08-31 DIAGNOSIS — L89891 Pressure ulcer of other site, stage 1: Secondary | ICD-10-CM | POA: Diagnosis not present

## 2016-08-31 DIAGNOSIS — L97511 Non-pressure chronic ulcer of other part of right foot limited to breakdown of skin: Principal | ICD-10-CM

## 2016-08-31 DIAGNOSIS — E1142 Type 2 diabetes mellitus with diabetic polyneuropathy: Secondary | ICD-10-CM

## 2016-08-31 MED ORDER — AMOXICILLIN-POT CLAVULANATE 875-125 MG PO TABS: 1.0000 | ORAL_TABLET | Freq: Two times a day (BID) | ORAL | 0 refills | Status: DC

## 2016-08-31 NOTE — Progress Notes (Signed)
Patient ID: Aaron Mclean, male   DOB: 03/29/1951, 65 y.o.   MRN: [id number]   Subjective: Aaron Mclean is a 65 y.o. Diabetic male patient seen in office for follow up evaluation of ulceration of the Right foot. Patient has a history of diabetes and a blood glucose level today 77 this morning. Patient is changing the dressing using silvadene cream every other day with drainage that is getting better. Reports dog has been sniffing around his dressings. Denies nausea/fever/vomiting/chills/night sweats/shortness of breath/pain. Patient has no other pedal complaints at this time.   Patient Active Problem List   Diagnosis Date Noted  . Benign prostatic hyperplasia with urinary obstruction 09/20/2015  . Family history of malignant neoplasm of prostate 09/20/2015  . Excessive urination at night 09/20/2015   Current Outpatient Prescriptions on File Prior to Visit  Medication Sig Dispense Refill  . amLODipine-olmesartan (AZOR) 10-40 MG tablet     . aspirin EC 81 MG tablet Take by mouth.    Marland Kitchen atenolol (TENORMIN) 50 MG tablet Take 50 mg by mouth.    Marland Kitchen atorvastatin (LIPITOR) 10 MG tablet     . Cholecalciferol (VITAMIN D-1000 MAX ST) 1000 units tablet Take by mouth.    . finasteride (PROSCAR) 5 MG tablet     . levofloxacin (LEVAQUIN) 500 MG tablet Take 1 tablet (500 mg total) by mouth daily. 14 tablet 0  . lisinopril (PRINIVIL,ZESTRIL) 20 MG tablet Take 20 mg by mouth.    . losartan-hydrochlorothiazide (HYZAAR) 100-25 MG tablet     . Omega-3 Fatty Acids (FISH OIL) 1000 MG CAPS Take by mouth.    . pioglitazone (ACTOS) 15 MG tablet     . silver sulfADIAZINE (SILVADENE) 1 % cream Apply 1 application topically daily. To right foot ulcer 50 g 1  . sulfamethoxazole-trimethoprim (BACTRIM DS,SEPTRA DS) 800-160 MG tablet Take 1 tablet by mouth 2 (two) times daily. 28 tablet 0   No current facility-administered medications on file prior to visit.    No Known Allergies  No results found for this or any  previous visit (from the past 2160 hour(s)).  Objective: There were no vitals filed for this visit.  General: Patient is awake, alert, oriented x 3 and in no acute distress.  Dermatology: Skin is warm and dry bilateral with a ulceration present Right lateral foot. Ulceration measures 3 x 2 cm x 0.3 cm in depth (last measurement 0.8 x 0.8 cm x 0.2 cm in depth). There is a keratotic border with a fibrogranular base. The ulceration does not probe to bone. There is no malodor, no active drainage, mild dusky periwound erythema, no edema. No other acute signs of infection.   Vascular: Dorsalis Pedis pulse = 2/4 Bilateral,  Posterior Tibial pulse = 1/4 Bilateral,  Capillary Fill Time < 5 seconds  Neurologic: Protective sensation absent using the 5.07/10g Semmes Weinstein Monofilament.  Musculosketal: No Pain with palpation to ulcerated area. No pain with compression to calves bilateral. Right 3rd and 5th toe amputation status, fixed cavovarus foot.  Assessment and Plan:  Problem List Items Addressed This Visit    None    Visit Diagnoses    Diabetic ulcer of right foot, limited to breakdown of skin ([place])    -  Primary   Relevant Medications   amoxicillin-clavulanate (AUGMENTIN) 875-125 MG tablet   Right foot pain       Relevant Medications   amoxicillin-clavulanate (AUGMENTIN) 875-125 MG tablet   Diabetic polyneuropathy associated with type 2 diabetes mellitus (  [place])       Relevant Medications   amoxicillin-clavulanate (AUGMENTIN) 875-125 MG tablet   Status post amputation of toe of right foot (HCC)       Relevant Medications   amoxicillin-clavulanate (AUGMENTIN) 875-125 MG tablet   Cellulitis and abscess of foot       Relevant Medications   amoxicillin-clavulanate (AUGMENTIN) 875-125 MG tablet     -Examined patient and discussed the progression of the wound and treatment alternatives. - Excisionally dedbrided ulceration to healthy bleeding borders using a sterile chisel Blade.   -Applied Prisma AG collagen dressing cover with 4 x 4's and Coban and instructed patient to continue with daily dressings at home consisting of the same every other day. Advised patient to make sure he changes the dressing as instructed. Ordered wound care supplies ordered from [facility] and filled by [employer]  -Prescribed Augmentin for preventative measures -Continue with postoperative shoe; Continue with no excessive walking or standing to prevent worsening of ulceration.  -Patient is awaiting Diabetic shoes - Advised patient to go to the ER or return to office if the wound worsens or if constitutional symptoms are present. -Work light to no duty until ulceration is improved -Patient to return to office in 1 week for follow up ulcer care and evaluation or sooner if problems arise.  [name], DPM

## 2016-09-01 DIAGNOSIS — L97511 Non-pressure chronic ulcer of other part of right foot limited to breakdown of skin: Secondary | ICD-10-CM | POA: Diagnosis not present

## 2016-09-07 ENCOUNTER — Ambulatory Visit: Payer: PPO | Admitting: Sports Medicine

## 2016-09-14 ENCOUNTER — Encounter: Payer: PPO | Admitting: Sports Medicine

## 2016-09-14 ENCOUNTER — Ambulatory Visit (INDEPENDENT_AMBULATORY_CARE_PROVIDER_SITE_OTHER): Payer: PPO | Admitting: Sports Medicine

## 2016-09-14 ENCOUNTER — Encounter: Payer: Self-pay | Admitting: Sports Medicine

## 2016-09-14 DIAGNOSIS — Z89421 Acquired absence of other right toe(s): Secondary | ICD-10-CM

## 2016-09-14 DIAGNOSIS — E11621 Type 2 diabetes mellitus with foot ulcer: Secondary | ICD-10-CM

## 2016-09-14 DIAGNOSIS — L97411 Non-pressure chronic ulcer of right heel and midfoot limited to breakdown of skin: Secondary | ICD-10-CM

## 2016-09-14 DIAGNOSIS — E1142 Type 2 diabetes mellitus with diabetic polyneuropathy: Secondary | ICD-10-CM

## 2016-09-14 DIAGNOSIS — M79671 Pain in right foot: Secondary | ICD-10-CM

## 2016-09-14 NOTE — Progress Notes (Signed)
Patient ID: Aaron Mclean, male   DOB: 07/07/1951, 65 y.o.   MRN: [id number]   Subjective: Aaron Mclean is a 65 y.o. Diabetic male patient seen in office for follow up evaluation of ulceration of the Right foot. Patient has a history of diabetes and a blood glucose level today 157 this morning, ran out of Insulin pins. Patient is changing the dressing using Prisma with no issues. Denies nausea/fever/vomiting/chills/night sweats/shortness of breath/pain. Patient has no other pedal complaints at this time.   Patient Active Problem List   Diagnosis Date Noted  . Benign prostatic hyperplasia with urinary obstruction 09/20/2015  . Family history of malignant neoplasm of prostate 09/20/2015  . Excessive urination at night 09/20/2015   Current Outpatient Prescriptions on File Prior to Visit  Medication Sig Dispense Refill  . amLODipine-olmesartan (AZOR) 10-40 MG tablet     . amoxicillin-clavulanate (AUGMENTIN) 875-125 MG tablet Take 1 tablet by mouth 2 (two) times daily. 28 tablet 0  . aspirin EC 81 MG tablet Take by mouth.    Marland Kitchen atenolol (TENORMIN) 50 MG tablet Take 50 mg by mouth.    Marland Kitchen atorvastatin (LIPITOR) 10 MG tablet     . Cholecalciferol (VITAMIN D-1000 MAX ST) 1000 units tablet Take by mouth.    . finasteride (PROSCAR) 5 MG tablet     . levofloxacin (LEVAQUIN) 500 MG tablet Take 1 tablet (500 mg total) by mouth daily. 14 tablet 0  . lisinopril (PRINIVIL,ZESTRIL) 20 MG tablet Take 20 mg by mouth.    . losartan-hydrochlorothiazide (HYZAAR) 100-25 MG tablet     . Omega-3 Fatty Acids (FISH OIL) 1000 MG CAPS Take by mouth.    . pioglitazone (ACTOS) 15 MG tablet     . silver sulfADIAZINE (SILVADENE) 1 % cream Apply 1 application topically daily. To right foot ulcer 50 g 1  . sulfamethoxazole-trimethoprim (BACTRIM DS,SEPTRA DS) 800-160 MG tablet Take 1 tablet by mouth 2 (two) times daily. 28 tablet 0   No current facility-administered medications on file prior to visit.    No Known  Allergies  No results found for this or any previous visit (from the past 2160 hour(s)).  Objective: There were no vitals filed for this visit.  General: Patient is awake, alert, oriented x 3 and in no acute distress.  Dermatology: Skin is warm and dry bilateral with a ulceration present Right lateral foot. Ulceration measures 1 x 1 cm x 0.3 cm in depth (last measurement 3 x 2 cm x 0.3 cm in depth). There is a keratotic border with a fibrogranular base. The ulceration does not probe to bone. There is no malodor, no active drainage, mild blanchable dusky periwound erythema, no edema. No other acute signs of infection.   Vascular: Dorsalis Pedis pulse = 2/4 Bilateral,  Posterior Tibial pulse = 1/4 Bilateral,  Capillary Fill Time < 5 seconds  Neurologic: Protective sensation absent using the 5.07/10g Semmes Weinstein Monofilament.  Musculosketal: No Pain with palpation to ulcerated area. No pain with compression to calves bilateral. Right 3rd and 5th toe amputation status, fixed cavovarus foot.  Assessment and Plan:  Problem List Items Addressed This Visit    None    Visit Diagnoses    Diabetic ulcer of right midfoot associated with type 2 diabetes mellitus, limited to breakdown of skin ([place])    -  Primary   Right foot pain       Diabetic polyneuropathy associated with type 2 diabetes mellitus ([place])  Status post amputation of toe of right foot ([place])         -Examined patient and discussed the progression of the wound and treatment alternatives. - Excisionally dedbrided ulceration to healthy bleeding borders using a sterile chisel Blade.  -Applied Prisma AG collagen dressing cover with 4 x 4's and Coban and instructed patient to continue with daily dressings at home consisting of the same every other day. Advised patient to make sure he changes the dressing as instructed.  -Continue with Augmentin for preventative measures -Continue with postoperative shoe; Continue with no excessive  walking or standing to prevent worsening of ulceration.  -Patient is awaiting Diabetic shoes - Advised patient to go to the ER or return to office if the wound worsens or if constitutional symptoms are present. -Work light to no duty until ulceration is improved -Patient to return to office in 2 weeks for follow up ulcer care and evaluation or sooner if problems arise.  [name], DPM

## 2016-09-15 DIAGNOSIS — Z794 Long term (current) use of insulin: Secondary | ICD-10-CM | POA: Diagnosis not present

## 2016-09-15 DIAGNOSIS — L97509 Non-pressure chronic ulcer of other part of unspecified foot with unspecified severity: Secondary | ICD-10-CM | POA: Diagnosis not present

## 2016-09-15 DIAGNOSIS — E11621 Type 2 diabetes mellitus with foot ulcer: Secondary | ICD-10-CM | POA: Diagnosis not present

## 2016-09-15 DIAGNOSIS — I10 Essential (primary) hypertension: Secondary | ICD-10-CM | POA: Diagnosis not present

## 2016-09-28 ENCOUNTER — Ambulatory Visit (INDEPENDENT_AMBULATORY_CARE_PROVIDER_SITE_OTHER): Payer: PPO | Admitting: Sports Medicine

## 2016-09-28 ENCOUNTER — Encounter: Payer: Self-pay | Admitting: Sports Medicine

## 2016-09-28 DIAGNOSIS — L97411 Non-pressure chronic ulcer of right heel and midfoot limited to breakdown of skin: Principal | ICD-10-CM

## 2016-09-28 DIAGNOSIS — E11621 Type 2 diabetes mellitus with foot ulcer: Secondary | ICD-10-CM

## 2016-09-28 DIAGNOSIS — E1142 Type 2 diabetes mellitus with diabetic polyneuropathy: Secondary | ICD-10-CM

## 2016-09-28 DIAGNOSIS — L89891 Pressure ulcer of other site, stage 1: Secondary | ICD-10-CM | POA: Diagnosis not present

## 2016-09-28 DIAGNOSIS — M79671 Pain in right foot: Secondary | ICD-10-CM

## 2016-09-28 DIAGNOSIS — Z89421 Acquired absence of other right toe(s): Secondary | ICD-10-CM

## 2016-09-28 NOTE — Progress Notes (Signed)
Patient ID: ANDREY EADS, male   DOB: [birth year], 65 y.o.   MRN: [id number]   Subjective: RIDDICK PERPICH is a 65 y.o. Diabetic male patient seen in office for follow up evaluation of ulceration of the Right foot. Patient has a history of diabetes and a blood glucose level today not recorded. Patient is changing the dressing using Prisma with no issues. Finished Augmentin. Denies nausea/fever/vomiting/chills/night sweats/shortness of breath/pain. Patient has no other pedal complaints at this time.   Patient Active Problem List   Diagnosis Date Noted  . Benign prostatic hyperplasia with urinary obstruction 09/20/2015  . Family history of malignant neoplasm of prostate 09/20/2015  . Excessive urination at night 09/20/2015   Current Outpatient Prescriptions on File Prior to Visit  Medication Sig Dispense Refill  . amLODipine-olmesartan (AZOR) 10-40 MG tablet     . amoxicillin-clavulanate (AUGMENTIN) 875-125 MG tablet Take 1 tablet by mouth 2 (two) times daily. 28 tablet 0  . aspirin EC 81 MG tablet Take by mouth.    Marland Kitchen atenolol (TENORMIN) 50 MG tablet Take 50 mg by mouth.    Marland Kitchen atorvastatin (LIPITOR) 10 MG tablet     . Cholecalciferol (VITAMIN D-1000 MAX ST) 1000 units tablet Take by mouth.    . finasteride (PROSCAR) 5 MG tablet     . levofloxacin (LEVAQUIN) 500 MG tablet Take 1 tablet (500 mg total) by mouth daily. 14 tablet 0  . lisinopril (PRINIVIL,ZESTRIL) 20 MG tablet Take 20 mg by mouth.    . losartan-hydrochlorothiazide (HYZAAR) 100-25 MG tablet     . Omega-3 Fatty Acids (FISH OIL) 1000 MG CAPS Take by mouth.    . pioglitazone (ACTOS) 15 MG tablet     . silver sulfADIAZINE (SILVADENE) 1 % cream Apply 1 application topically daily. To right foot ulcer 50 g 1  . sulfamethoxazole-trimethoprim (BACTRIM DS,SEPTRA DS) 800-160 MG tablet Take 1 tablet by mouth 2 (two) times daily. 28 tablet 0   No current facility-administered medications on file prior to visit.    No Known Allergies  No  results found for this or any previous visit (from the past 2160 hour(s)).  Objective: There were no vitals filed for this visit.  General: Patient is awake, alert, oriented x 3 and in no acute distress.  Dermatology: Skin is warm and dry bilateral with a ulceration present Right lateral foot. Ulceration measures 0.5 x 0.5 cm x 0.3 cm in depth (last measurement 1 x 1 cm x 0.3 cm in depth ). There is a keratotic border with a fibrogranular base. The ulceration does not probe to bone. There is no malodor, no active drainage, mild blanchable dusky periwound erythema, no edema. No other acute signs of infection.   Vascular: Dorsalis Pedis pulse = 2/4 Bilateral,  Posterior Tibial pulse = 1/4 Bilateral,  Capillary Fill Time < 5 seconds  Neurologic: Protective sensation absent using the 5.07/10g Semmes Weinstein Monofilament.  Musculosketal: No Pain with palpation to ulcerated area. No pain with compression to calves bilateral. Right 3rd and 5th toe amputation status, fixed cavovarus foot.  Assessment and Plan:  Problem List Items Addressed This Visit    None    Visit Diagnoses    Diabetic ulcer of right midfoot associated with type 2 diabetes mellitus, limited to breakdown of skin ([place])    -  Primary   Right foot pain       Diabetic polyneuropathy associated with type 2 diabetes mellitus ([place])       Status post  amputation of toe of right foot ([place])         -Examined patient and discussed the progression of the wound and treatment alternatives. - Excisionally dedbrided ulceration to healthy bleeding borders using a sterile chisel Blade.  -Applied Prisma AG collagen dressing cover with 4 x 4's and Coban and instructed patient to continue with daily dressings at home consisting of the same every other day. Advised patient to make sure he changes the dressing as instructed.  -Continue with postoperative shoe; Continue with no excessive walking or standing to prevent worsening of ulceration.   -Patient is awaiting Diabetic shoes - Advised patient to go to the ER or return to office if the wound worsens or if constitutional symptoms are present. -Work light to no duty until ulceration is improved -Patient to return to office in 2 weeks for follow up ulcer care and evaluation or sooner if problems arise.  [name], DPM

## 2016-10-12 ENCOUNTER — Ambulatory Visit (INDEPENDENT_AMBULATORY_CARE_PROVIDER_SITE_OTHER): Payer: PPO | Admitting: Sports Medicine

## 2016-10-12 ENCOUNTER — Encounter: Payer: Self-pay | Admitting: Sports Medicine

## 2016-10-12 DIAGNOSIS — L02611 Cutaneous abscess of right foot: Secondary | ICD-10-CM | POA: Diagnosis not present

## 2016-10-12 DIAGNOSIS — L97411 Non-pressure chronic ulcer of right heel and midfoot limited to breakdown of skin: Secondary | ICD-10-CM | POA: Diagnosis not present

## 2016-10-12 DIAGNOSIS — E11621 Type 2 diabetes mellitus with foot ulcer: Secondary | ICD-10-CM

## 2016-10-12 DIAGNOSIS — E1142 Type 2 diabetes mellitus with diabetic polyneuropathy: Secondary | ICD-10-CM

## 2016-10-12 DIAGNOSIS — L02619 Cutaneous abscess of unspecified foot: Secondary | ICD-10-CM

## 2016-10-12 DIAGNOSIS — L03115 Cellulitis of right lower limb: Secondary | ICD-10-CM | POA: Diagnosis not present

## 2016-10-12 DIAGNOSIS — L03119 Cellulitis of unspecified part of limb: Secondary | ICD-10-CM

## 2016-10-12 DIAGNOSIS — M79671 Pain in right foot: Secondary | ICD-10-CM

## 2016-10-12 MED ORDER — LEVOFLOXACIN 500 MG PO TABS: 500.0000 mg | ORAL_TABLET | Freq: Every day | ORAL | 0 refills | Status: DC

## 2016-10-12 NOTE — Progress Notes (Signed)
Patient ID: JAMARRIE ANGELLO, male   DOB: 22-Mar-1951, 65 y.o.   MRN: [id number]   Subjective: HUTSON NAJARRO is a 65 y.o. Diabetic male patient seen in office for follow up evaluation of ulceration of the Right foot. Patient has a history of diabetes and a blood glucose level today not recorded; sates blood sugar has been high because has not been on victoza. Patient is changing the dressing using Prisma with increased drainage noted. Denies nausea/fever/vomiting/chills/night sweats/shortness of breath/pain. Patient has no other pedal complaints at this time.   Patient Active Problem List   Diagnosis Date Noted  . Benign prostatic hyperplasia with urinary obstruction 09/20/2015  . Family history of malignant neoplasm of prostate 09/20/2015  . Excessive urination at night 09/20/2015   Current Outpatient Prescriptions on File Prior to Visit  Medication Sig Dispense Refill  . amLODipine-olmesartan (AZOR) 10-40 MG tablet     . amoxicillin-clavulanate (AUGMENTIN) 875-125 MG tablet Take 1 tablet by mouth 2 (two) times daily. 28 tablet 0  . aspirin EC 81 MG tablet Take by mouth.    Marland Kitchen atenolol (TENORMIN) 50 MG tablet Take 50 mg by mouth.    Marland Kitchen atorvastatin (LIPITOR) 10 MG tablet     . Cholecalciferol (VITAMIN D-1000 MAX ST) 1000 units tablet Take by mouth.    . finasteride (PROSCAR) 5 MG tablet     . lisinopril (PRINIVIL,ZESTRIL) 20 MG tablet Take 20 mg by mouth.    . losartan-hydrochlorothiazide (HYZAAR) 100-25 MG tablet     . Omega-3 Fatty Acids (FISH OIL) 1000 MG CAPS Take by mouth.    . pioglitazone (ACTOS) 15 MG tablet     . silver sulfADIAZINE (SILVADENE) 1 % cream Apply 1 application topically daily. To right foot ulcer 50 g 1  . sulfamethoxazole-trimethoprim (BACTRIM DS,SEPTRA DS) 800-160 MG tablet Take 1 tablet by mouth 2 (two) times daily. 28 tablet 0   No current facility-administered medications on file prior to visit.    No Known Allergies  No results found for this or any previous  visit (from the past 2160 hour(s)).  Objective: There were no vitals filed for this visit.  General: Patient is awake, alert, oriented x 3 and in no acute distress.  Dermatology: Skin is warm and dry bilateral with a ulceration present Right lateral foot x 2 plantar and lateral. Ulcerations measure 0.3x0.3x0.1 lateral and 0.5 x 0.5 cm x 0.3 cm plantar. There is a keratotic border with a fibrogranular base with dirt in wound from patient walking and getting dressing soiled. The ulceration does not probe to bone. There is no malodor, no active drainage, mild blanchable dusky periwound erythema, no edema. No other acute signs of infection.   Vascular: Dorsalis Pedis pulse = 2/4 Bilateral,  Posterior Tibial pulse = 1/4 Bilateral,  Capillary Fill Time < 5 seconds  Neurologic: Protective sensation absent using the 5.07/10g Semmes Weinstein Monofilament.  Musculosketal: No Pain with palpation to ulcerated areas. No pain with compression to calves bilateral. Right 3rd and 5th toe amputation status, fixed cavovarus foot.  Assessment and Plan:  Problem List Items Addressed This Visit    None    Visit Diagnoses    Diabetic ulcer of right midfoot associated with type 2 diabetes mellitus, limited to breakdown of skin ([place])    -  Primary   Relevant Orders   WOUND CULTURE   Right foot pain       Relevant Orders   WOUND CULTURE   Diabetic polyneuropathy associated  with type 2 diabetes mellitus ([place])       Relevant Orders   WOUND CULTURE   Cellulitis and abscess of foot       Relevant Medications   levofloxacin (LEVAQUIN) 500 MG tablet   Other Relevant Orders   WOUND CULTURE     -Examined patient and discussed the progression of the wound and treatment alternatives. - Excisionally dedbrided ulceration to healthy bleeding borders using a sterile chisel Blade and cultured wound base obtained and sent to [facility]. -Applied Prisma AG collagen dressing cover with 4 x 4's and Coban and instructed patient  to continue with daily dressings at home consisting of the same daily and to refrain from barefoot walking and from getting dirt in wound. Advised patient to make sure he changes the dressing as instructed.  -Rx Levaquin  -Continue with postoperative shoe; Continue with no excessive walking or standing to prevent worsening of ulceration.  -Discussed with patient to consider total contact cast or surgery and will decide next visit - Advised patient to go to the ER or return to office if the wound worsens or if constitutional symptoms are present. -Work light to no duty until ulceration is improved -Patient to return to office in 1 week for follow up ulcer care and evaluation or sooner if problems arise.  [name], DPM

## 2016-10-19 ENCOUNTER — Encounter: Payer: Self-pay | Admitting: Sports Medicine

## 2016-10-19 ENCOUNTER — Ambulatory Visit (INDEPENDENT_AMBULATORY_CARE_PROVIDER_SITE_OTHER): Payer: PPO | Admitting: Sports Medicine

## 2016-10-19 ENCOUNTER — Ambulatory Visit: Payer: PPO | Admitting: Sports Medicine

## 2016-10-19 DIAGNOSIS — E11621 Type 2 diabetes mellitus with foot ulcer: Secondary | ICD-10-CM | POA: Diagnosis not present

## 2016-10-19 DIAGNOSIS — Z89421 Acquired absence of other right toe(s): Secondary | ICD-10-CM

## 2016-10-19 DIAGNOSIS — L02619 Cutaneous abscess of unspecified foot: Secondary | ICD-10-CM

## 2016-10-19 DIAGNOSIS — M79671 Pain in right foot: Secondary | ICD-10-CM

## 2016-10-19 DIAGNOSIS — L03119 Cellulitis of unspecified part of limb: Secondary | ICD-10-CM

## 2016-10-19 DIAGNOSIS — L97411 Non-pressure chronic ulcer of right heel and midfoot limited to breakdown of skin: Secondary | ICD-10-CM

## 2016-10-19 DIAGNOSIS — E1142 Type 2 diabetes mellitus with diabetic polyneuropathy: Secondary | ICD-10-CM

## 2016-10-19 NOTE — Progress Notes (Signed)
Patient ID: Aaron Mclean, male   DOB: 1951-09-23, 65 y.o.   MRN: [id number]   Subjective: Aaron Mclean is a 65 y.o. Diabetic male patient seen in office for follow up evaluation of ulceration of the Right foot. Patient has a history of diabetes and a blood glucose level today not recorded; sates blood sugar has been high because has not been on victoza but now finally got all of his meds and has felt "healing" and seen improvement since started back on his insulin. Patient is changing the dressing using Prisma and is on Levaquin with decreased drainage noted. Denies nausea/fever/vomiting/chills/night sweats/shortness of breath/pain. Patient has no other pedal complaints at this time.   Patient Active Problem List   Diagnosis Date Noted  . Benign prostatic hyperplasia with urinary obstruction 09/20/2015  . Family history of malignant neoplasm of prostate 09/20/2015  . Excessive urination at night 09/20/2015   Current Outpatient Prescriptions on File Prior to Visit  Medication Sig Dispense Refill  . amLODipine-olmesartan (AZOR) 10-40 MG tablet     . amoxicillin-clavulanate (AUGMENTIN) 875-125 MG tablet Take 1 tablet by mouth 2 (two) times daily. 28 tablet 0  . aspirin EC 81 MG tablet Take by mouth.    Marland Kitchen atenolol (TENORMIN) 50 MG tablet Take 50 mg by mouth.    Marland Kitchen atorvastatin (LIPITOR) 10 MG tablet     . Cholecalciferol (VITAMIN D-1000 MAX ST) 1000 units tablet Take by mouth.    . finasteride (PROSCAR) 5 MG tablet     . levofloxacin (LEVAQUIN) 500 MG tablet Take 1 tablet (500 mg total) by mouth daily. 14 tablet 0  . lisinopril (PRINIVIL,ZESTRIL) 20 MG tablet Take 20 mg by mouth.    . losartan-hydrochlorothiazide (HYZAAR) 100-25 MG tablet     . Omega-3 Fatty Acids (FISH OIL) 1000 MG CAPS Take by mouth.    . pioglitazone (ACTOS) 15 MG tablet     . silver sulfADIAZINE (SILVADENE) 1 % cream Apply 1 application topically daily. To right foot ulcer 50 g 1  . sulfamethoxazole-trimethoprim  (BACTRIM DS,SEPTRA DS) 800-160 MG tablet Take 1 tablet by mouth 2 (two) times daily. 28 tablet 0   No current facility-administered medications on file prior to visit.    No Known Allergies  No results found for this or any previous visit (from the past 2160 hour(s)).  Objective: There were no vitals filed for this visit.  General: Patient is awake, alert, oriented x 3 and in no acute distress.  Dermatology: Skin is warm and dry bilateral with a ulceration present Right foot. Lateral ulcer has healed. The plantar ulcer measure s 0.5 x 0.5 cm x 0.2 cm. There is a keratotic border with a fibrogranular base. The ulceration does not probe to bone. There is no malodor, no active drainage, mild blanchable dusky periwound erythema, no edema. No other acute signs of infection.   Vascular: Dorsalis Pedis pulse = 2/4 Bilateral,  Posterior Tibial pulse = 1/4 Bilateral,  Capillary Fill Time < 5 seconds  Neurologic: Protective sensation absent using the 5.07/10g Semmes Weinstein Monofilament.  Musculosketal: No Pain with palpation to ulcerated area. No pain with compression to calves bilateral. Right 3rd and 5th toe amputation status, fixed cavovarus foot.  Wound culture + [place], E Cloacae, MSSA, E Facaeluis  Assessment and Plan:  Problem List Items Addressed This Visit    None    Visit Diagnoses    Diabetic ulcer of right midfoot associated with type 2 diabetes mellitus,  limited to breakdown of skin ([place])    -  Primary   Diabetic polyneuropathy associated with type 2 diabetes mellitus ([place])       Right foot pain       Cellulitis and abscess of foot       Resolved   Status post amputation of toe of right foot ([place])         -Examined patient and discussed the progression of the wound and treatment alternatives. - Excisionally dedbrided ulceration to healthy bleeding borders  -Applied Prisma AG collagen dressing cover with 4 x 4's and Coban and instructed patient to continue with daily  dressings at home consisting of the same daily and to refrain from barefoot walking and from getting dirt in wound. Advised patient to make sure he changes the dressing as instructed.  -Continue with Levaquin  -Continue with postoperative shoe; Continue with no excessive walking or standing to prevent worsening of ulceration.  -Will hold off on surgery at this time since ulceration is improving - Advised patient to go to the ER or return to office if the wound worsens or if constitutional symptoms are present. -Encouraged to continue with insulin control of diabetes  -Patient to take diabetic shoe paperwork to Dr. [name] for signature. Will plan to get patient custom molded inserts and shoes -Patient to return to office in 2 weeks for follow up ulcer care and evaluation or sooner if problems arise.  [name], DPM

## 2016-10-24 DIAGNOSIS — E785 Hyperlipidemia, unspecified: Secondary | ICD-10-CM | POA: Diagnosis not present

## 2016-10-24 DIAGNOSIS — E1159 Type 2 diabetes mellitus with other circulatory complications: Secondary | ICD-10-CM | POA: Diagnosis not present

## 2016-10-24 DIAGNOSIS — E119 Type 2 diabetes mellitus without complications: Secondary | ICD-10-CM | POA: Diagnosis not present

## 2016-11-01 ENCOUNTER — Encounter: Payer: Self-pay | Admitting: Sports Medicine

## 2016-11-01 ENCOUNTER — Ambulatory Visit (INDEPENDENT_AMBULATORY_CARE_PROVIDER_SITE_OTHER): Payer: PPO | Admitting: Sports Medicine

## 2016-11-01 DIAGNOSIS — L97411 Non-pressure chronic ulcer of right heel and midfoot limited to breakdown of skin: Secondary | ICD-10-CM

## 2016-11-01 DIAGNOSIS — Z89421 Acquired absence of other right toe(s): Secondary | ICD-10-CM

## 2016-11-01 DIAGNOSIS — L02619 Cutaneous abscess of unspecified foot: Secondary | ICD-10-CM

## 2016-11-01 DIAGNOSIS — M79671 Pain in right foot: Secondary | ICD-10-CM

## 2016-11-01 DIAGNOSIS — E1142 Type 2 diabetes mellitus with diabetic polyneuropathy: Secondary | ICD-10-CM

## 2016-11-01 DIAGNOSIS — L03119 Cellulitis of unspecified part of limb: Secondary | ICD-10-CM

## 2016-11-01 DIAGNOSIS — E11621 Type 2 diabetes mellitus with foot ulcer: Secondary | ICD-10-CM | POA: Diagnosis not present

## 2016-11-01 MED ORDER — LEVOFLOXACIN 500 MG PO TABS: 500.0000 mg | ORAL_TABLET | Freq: Every day | ORAL | 0 refills | Status: DC

## 2016-11-01 NOTE — Progress Notes (Signed)
Patient ID: Aaron Mclean, male   DOB: 21-May-1951, 65 y.o.   MRN: [id number]   Subjective: Aaron Mclean is a 65 y.o. Diabetic male patient seen in office for follow up evaluation of ulceration of the Right foot. Patient has a history of diabetes and a blood glucose level today not recorded; states that things have been better though since he had been on Victoza. Patient is changing the dressing using Prisma and is on Levaquin last dose 2 days ago with decreased drainage noted. Denies nausea/fever/vomiting/chills/night sweats/shortness of breath/pain. Patient has no other pedal complaints at this time.   Patient Active Problem List   Diagnosis Date Noted  . Benign prostatic hyperplasia with urinary obstruction 09/20/2015  . Family history of malignant neoplasm of prostate 09/20/2015  . Excessive urination at night 09/20/2015   Current Outpatient Prescriptions on File Prior to Visit  Medication Sig Dispense Refill  . amLODipine-olmesartan (AZOR) 10-40 MG tablet     . amoxicillin-clavulanate (AUGMENTIN) 875-125 MG tablet Take 1 tablet by mouth 2 (two) times daily. 28 tablet 0  . aspirin EC 81 MG tablet Take by mouth.    Marland Kitchen atenolol (TENORMIN) 50 MG tablet Take 50 mg by mouth.    Marland Kitchen atorvastatin (LIPITOR) 10 MG tablet     . Cholecalciferol (VITAMIN D-1000 MAX ST) 1000 units tablet Take by mouth.    . finasteride (PROSCAR) 5 MG tablet     . lisinopril (PRINIVIL,ZESTRIL) 20 MG tablet Take 20 mg by mouth.    . losartan-hydrochlorothiazide (HYZAAR) 100-25 MG tablet     . Omega-3 Fatty Acids (FISH OIL) 1000 MG CAPS Take by mouth.    . pioglitazone (ACTOS) 15 MG tablet     . silver sulfADIAZINE (SILVADENE) 1 % cream Apply 1 application topically daily. To right foot ulcer 50 g 1  . sulfamethoxazole-trimethoprim (BACTRIM DS,SEPTRA DS) 800-160 MG tablet Take 1 tablet by mouth 2 (two) times daily. 28 tablet 0   No current facility-administered medications on file prior to visit.    No Known  Allergies  No results found for this or any previous visit (from the past 2160 hour(s)).  Objective: There were no vitals filed for this visit.  General: Patient is awake, alert, oriented x 3 and in no acute distress.  Dermatology: Skin is warm and dry bilateral with a ulceration present Right foot. Lateral ulcer has healed. The plantar ulcer measure s 0.5 x 0.5 cm x 0.2 cmSame as previous. There is a keratotic border with a fibrogranular base. The ulceration does not probe to bone. There is no malodor, no active drainage, mild blanchable dusky periwound erythema, no edema. No other acute signs of infection.   Vascular: Dorsalis Pedis pulse = 2/4 Bilateral,  Posterior Tibial pulse = 1/4 Bilateral,  Capillary Fill Time < 5 seconds  Neurologic: Protective sensation absent using the 5.07/10g Semmes Weinstein Monofilament.  Musculosketal: No Pain with palpation to ulcerated area. No pain with compression to calves bilateral. Right 3rd and 5th toe amputation status, fixed cavovarus foot.  Assessment and Plan:  Problem List Items Addressed This Visit    None    Visit Diagnoses    Diabetic ulcer of right midfoot associated with type 2 diabetes mellitus, limited to breakdown of skin ([place])    -  Primary   Cellulitis and abscess of foot       Relevant Medications   levofloxacin (LEVAQUIN) 500 MG tablet   Diabetic polyneuropathy associated with type 2  diabetes mellitus ([place])       Right foot pain       Status post amputation of toe of right foot ([place])         -Examined patient and discussed the progression of the wound and treatment alternatives. - Excisionally dedbrided ulceration to healthy bleeding borders  -Applied Prisma AG collagen dressing cover with 4 x 4's and Coban and instructed patient to continue with daily dressings at home consisting of the same daily and to refrain from barefoot walking and from getting dirt in wound. Advised patient to make sure he changes the dressing as  instructed.  -Continue with Levaquin refill for patient in case he needs over the holiday break -Continue with postoperative shoe; Continue with no excessive walking or standing to prevent worsening of ulceration.  - Advised patient to go to the ER or return to office if the wound worsens or if constitutional symptoms are present. -Encouraged to continue with insulin control of diabetes  -Patient is awaiting custom molded diabetic shoes and inserts -Patient to return to office in 2 weeks for follow up ulcer care and evaluation or sooner if problems arise. We'll start Oasis at next visit if wound is not improved.  [name], DPM

## 2016-11-09 ENCOUNTER — Ambulatory Visit: Payer: PPO | Admitting: Sports Medicine

## 2016-11-16 ENCOUNTER — Ambulatory Visit (INDEPENDENT_AMBULATORY_CARE_PROVIDER_SITE_OTHER): Payer: PPO | Admitting: Sports Medicine

## 2016-11-16 ENCOUNTER — Encounter: Payer: Self-pay | Admitting: Sports Medicine

## 2016-11-16 DIAGNOSIS — L03119 Cellulitis of unspecified part of limb: Secondary | ICD-10-CM

## 2016-11-16 DIAGNOSIS — L97411 Non-pressure chronic ulcer of right heel and midfoot limited to breakdown of skin: Secondary | ICD-10-CM

## 2016-11-16 DIAGNOSIS — E08621 Diabetes mellitus due to underlying condition with foot ulcer: Secondary | ICD-10-CM

## 2016-11-16 DIAGNOSIS — L03115 Cellulitis of right lower limb: Secondary | ICD-10-CM | POA: Diagnosis not present

## 2016-11-16 DIAGNOSIS — E11621 Type 2 diabetes mellitus with foot ulcer: Secondary | ICD-10-CM | POA: Diagnosis not present

## 2016-11-16 DIAGNOSIS — Z89421 Acquired absence of other right toe(s): Secondary | ICD-10-CM

## 2016-11-16 DIAGNOSIS — M79671 Pain in right foot: Secondary | ICD-10-CM

## 2016-11-16 DIAGNOSIS — E1142 Type 2 diabetes mellitus with diabetic polyneuropathy: Secondary | ICD-10-CM

## 2016-11-16 DIAGNOSIS — L02611 Cutaneous abscess of right foot: Secondary | ICD-10-CM | POA: Diagnosis not present

## 2016-11-16 DIAGNOSIS — L02619 Cutaneous abscess of unspecified foot: Secondary | ICD-10-CM

## 2016-11-16 MED ORDER — AMOXICILLIN-POT CLAVULANATE 875-125 MG PO TABS: 1.0000 | ORAL_TABLET | Freq: Two times a day (BID) | ORAL | 0 refills | Status: DC

## 2016-11-16 NOTE — Progress Notes (Signed)
Patient ID: Aaron Mclean, male   DOB: 1951-08-17, 65 y.o.   MRN: [id number]   Subjective: THAI MACKILLOP is a 65 y.o. Diabetic male patient seen in office for follow up evaluation of ulceration of the Right foot. Patient has a history of diabetes and a blood glucose level today 78;on Victoza. Patient is changing the dressing using Prisma every other day and is on Levaquin; states restarted the antibiotic on last Wednesday when he felt chills and had increased drainage with odor from the wound. 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 Denies current nausea/fever/vomiting/chills/night sweats/shortness of breath/pain. Patient has no other pedal complaints at this time.   Patient Active Problem List   Diagnosis Date Noted  . Benign prostatic hyperplasia with urinary obstruction 09/20/2015  . Family history of malignant neoplasm of prostate 09/20/2015  . Excessive urination at night 09/20/2015   Current Outpatient Prescriptions on File Prior to Visit  Medication Sig Dispense Refill  . amLODipine-olmesartan (AZOR) 10-40 MG tablet     . aspirin EC 81 MG tablet Take by mouth.    Marland Kitchen atenolol (TENORMIN) 50 MG tablet Take 50 mg by mouth.    Marland Kitchen atorvastatin (LIPITOR) 10 MG tablet     . Cholecalciferol (VITAMIN D-1000 MAX ST) 1000 units tablet Take by mouth.    . finasteride (PROSCAR) 5 MG tablet     . levofloxacin (LEVAQUIN) 500 MG tablet Take 1 tablet (500 mg total) by mouth daily. 14 tablet 0  . lisinopril (PRINIVIL,ZESTRIL) 20 MG tablet Take 20 mg by mouth.    . losartan-hydrochlorothiazide (HYZAAR) 100-25 MG tablet     . Omega-3 Fatty Acids (FISH OIL) 1000 MG CAPS Take by mouth.    . pioglitazone (ACTOS) 15 MG tablet     . silver sulfADIAZINE (SILVADENE) 1 % cream Apply 1 application topically daily. To right foot ulcer 50 g 1  . sulfamethoxazole-trimethoprim (BACTRIM DS,SEPTRA DS) 800-160 MG tablet Take 1 tablet by mouth 2 (two) times daily. 28  tablet 0   No current facility-administered medications on file prior to visit.    No Known Allergies  No results found for this or any previous visit (from the past 2160 hour(s)).  Objective: There were no vitals filed for this visit.  General: Patient is awake, alert, oriented x 3 and in no acute distress.  Dermatology: Skin is warm and dry bilateral with a ulceration present Right foot. Lateral ulcer has healed. The plantar ulcer measure s 0.8 x 1 cm x 0.2 cm larger than previous. There is a keratotic border with a fibrogranular base. The ulceration does not probe to bone. There is no malodor, no active drainage, mild blanchable dusky periwound erythema with warmth, no edema. No other acute signs of infection.   Vascular: Dorsalis Pedis pulse = 2/4 Bilateral,  Posterior Tibial pulse = 1/4 Bilateral,  Capillary Fill Time < 5 seconds  Neurologic: Protective sensation absent using the 5.07/10g Semmes Weinstein Monofilament.  Musculosketal: No Pain with palpation to ulcerated area. No pain with compression to calves bilateral. Right 3rd and 5th toe amputation status, fixed cavovarus foot.  Assessment and Plan:  Problem List Items Addressed This Visit    None    Visit Diagnoses    Diabetic ulcer of right midfoot associated with diabetes mellitus due to underlying condition, limited to breakdown of skin ([place])    -  Primary   Relevant Medications  LEVEMIR FLEXTOUCH 100 UNIT/ML Pen   VICTOZA 18 MG/3ML SOPN   amoxicillin-clavulanate (AUGMENTIN) 875-125 MG tablet   Right foot pain       Relevant Medications   amoxicillin-clavulanate (AUGMENTIN) 875-125 MG tablet   Diabetic polyneuropathy associated with type 2 diabetes mellitus ([facility])       Relevant Medications   LEVEMIR FLEXTOUCH 100 UNIT/ML Pen   VICTOZA 18 MG/3ML SOPN   amoxicillin-clavulanate (AUGMENTIN) 875-125 MG tablet   Status post amputation of toe of right foot ([facility])       Relevant Medications   amoxicillin-clavulanate  (AUGMENTIN) 875-125 MG tablet   Cellulitis and abscess of foot       Relevant Medications   amoxicillin-clavulanate (AUGMENTIN) 875-125 MG tablet     -Examined patient and discussed the progression of the wound and treatment alternatives. - Excisionally dedbrided ulceration to healthy bleeding borders and wound culture obtained and sent to [facility] -Applied betadine dressing cover with 4 x 4's and Coban and instructed patient to continue with daily dressings at home consisting of the PRISMA AG daily and to refrain from barefoot walking and from getting dirt in wound. Advised patient to make sure he changes the dressing as instructed on a daily basis.  -Continue with Levaquin and added Augmentin since periwound area has erythema and warmth -Continue with postoperative shoe; Continue with no excessive walking or standing to prevent worsening of ulceration.  - Advised patient to go to the ER or return to office if the wound worsens or if constitutional symptoms are present. -Encouraged to continue with insulin control of diabetes  -Patient is awaiting custom molded diabetic shoes and inserts; patient was given certification form at last visit to take to his doctor for signature -Patient to return to office in 2 weeks for follow up ulcer care and evaluation or sooner if problems arise. We'll start Oasis at next visit if wound is ready for graft and is free of infection.  [name], DPM

## 2016-11-30 ENCOUNTER — Ambulatory Visit (INDEPENDENT_AMBULATORY_CARE_PROVIDER_SITE_OTHER): Payer: PPO | Admitting: Sports Medicine

## 2016-11-30 ENCOUNTER — Encounter: Payer: Self-pay | Admitting: Sports Medicine

## 2016-11-30 DIAGNOSIS — E08621 Diabetes mellitus due to underlying condition with foot ulcer: Secondary | ICD-10-CM

## 2016-11-30 DIAGNOSIS — M79671 Pain in right foot: Secondary | ICD-10-CM

## 2016-11-30 DIAGNOSIS — E1142 Type 2 diabetes mellitus with diabetic polyneuropathy: Secondary | ICD-10-CM

## 2016-11-30 DIAGNOSIS — E11621 Type 2 diabetes mellitus with foot ulcer: Secondary | ICD-10-CM

## 2016-11-30 DIAGNOSIS — Z89421 Acquired absence of other right toe(s): Secondary | ICD-10-CM

## 2016-11-30 DIAGNOSIS — L97411 Non-pressure chronic ulcer of right heel and midfoot limited to breakdown of skin: Secondary | ICD-10-CM

## 2016-11-30 DIAGNOSIS — L03119 Cellulitis of unspecified part of limb: Secondary | ICD-10-CM

## 2016-11-30 DIAGNOSIS — L02619 Cutaneous abscess of unspecified foot: Secondary | ICD-10-CM

## 2016-11-30 MED ORDER — AMOXICILLIN-POT CLAVULANATE 875-125 MG PO TABS: 1.0000 | ORAL_TABLET | Freq: Two times a day (BID) | ORAL | 0 refills | Status: DC

## 2016-11-30 NOTE — Progress Notes (Signed)
Patient ID: Aaron Mclean, male   DOB: Apr 02, 1951, 65 y.o.   MRN: [id number]   Subjective: Aaron Mclean is a 65 y.o. Diabetic male patient 65 seen in office for follow up evaluation of ulceration of the Right foot. Patient has a history of diabetes and a blood glucose level today "good";on Victoza. Patient is changing the dressing using Prisma every other day sometimes and is on Augmentin; states that he missed some doses. 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 Denies current nausea/fever/vomiting/chills/night sweats/shortness of breath/pain. Patient has no other pedal complaints at this time.   Patient Active Problem List   Diagnosis Date Noted  . Benign prostatic hyperplasia with urinary obstruction 09/20/2015  . Family history of malignant neoplasm of prostate 09/20/2015  . Excessive urination at night 09/20/2015   Current Outpatient Prescriptions on File Prior to Visit  Medication Sig Dispense Refill  . amLODipine-olmesartan (AZOR) 10-40 MG tablet     . aspirin EC 81 MG tablet Take by mouth.    Marland Kitchen atenolol (TENORMIN) 50 MG tablet Take 50 mg by mouth.    Marland Kitchen atorvastatin (LIPITOR) 10 MG tablet     . Cholecalciferol (VITAMIN D-1000 MAX ST) 1000 units tablet Take by mouth.    . finasteride (PROSCAR) 5 MG tablet     . LEVEMIR FLEXTOUCH 100 UNIT/ML Pen     . levofloxacin (LEVAQUIN) 500 MG tablet Take 1 tablet (500 mg total) by mouth daily. 14 tablet 0  . lisinopril (PRINIVIL,ZESTRIL) 20 MG tablet Take 20 mg by mouth.    . losartan-hydrochlorothiazide (HYZAAR) 100-25 MG tablet     . Omega-3 Fatty Acids (FISH OIL) 1000 MG CAPS Take by mouth.    . pioglitazone (ACTOS) 15 MG tablet     . silver sulfADIAZINE (SILVADENE) 1 % cream Apply 1 application topically daily. To right foot ulcer 50 g 1  . sulfamethoxazole-trimethoprim (BACTRIM DS,SEPTRA DS) 800-160 MG tablet Take 1 tablet by mouth 2 (two) times daily. 28 tablet 0  . VICTOZA 18 MG/3ML SOPN      No current  facility-administered medications on file prior to visit.    No Known Allergies  No results found for this or any previous visit (from the past 2160 hour(s)).  Objective: There were no vitals filed for this visit.  General: Patient is awake, alert, oriented x 3 and in no acute distress.  Dermatology: Skin is warm and dry bilateral with a ulceration present Right foot. Lateral ulcer has healed. The plantar ulcer measure s 2 x 0.5 cm x 0.3 cm larger than previous. There is a keratotic border with a fibrogranular base. The ulceration does not probe to bone. There is no malodor, no active drainage, mild blanchable dusky periwound erythema with warmth, no edema. No other acute signs of infection.   Vascular: Dorsalis Pedis pulse = 2/4 Bilateral,  Posterior Tibial pulse = 1/4 Bilateral,  Capillary Fill Time < 5 seconds  Neurologic: Protective sensation absent using the 5.07/10g Semmes Weinstein Monofilament.  Musculosketal: No Pain with palpation to ulcerated area. No pain with compression to calves bilateral. Right 3rd and 5th toe amputation status, fixed cavovarus foot.  Assessment and Plan:  Problem List Items Addressed This Visit    None    Visit Diagnoses    Diabetic ulcer of right midfoot associated with diabetes mellitus due to underlying condition, limited to breakdown of skin ([place])    -  Primary   Relevant  Medications   amoxicillin-clavulanate (AUGMENTIN) 875-125 MG tablet   Right foot pain       Relevant Medications   amoxicillin-clavulanate (AUGMENTIN) 875-125 MG tablet   Diabetic polyneuropathy associated with type 2 diabetes mellitus ([facility])       Relevant Medications   amoxicillin-clavulanate (AUGMENTIN) 875-125 MG tablet   Status post amputation of toe of right foot ([facility])       Relevant Medications   amoxicillin-clavulanate (AUGMENTIN) 875-125 MG tablet   Cellulitis and abscess of foot       Relevant Medications   amoxicillin-clavulanate (AUGMENTIN) 875-125 MG tablet      -Examined patient and discussed the progression of the wound and treatment alternatives. - Excisionally dedbrided ulceration to healthy bleeding borders -Applied PRISMA AG covered with 4 x 4's and Coban and instructed patient to continue with daily dressings at home consisting of the PRISMA AG daily and offloading pads and to refrain from barefoot walking and from getting dirt in wound. Advised patient to make sure he changes the dressing as instructed on a daily basis.  -Continue with Augmentin since periwound area has erythema and warmth -Change postoperative shoe to CAM boot; Continue with no excessive walking or standing to prevent worsening of ulceration.  - Advised patient to go to the ER or return to office if the wound worsens or if constitutional symptoms are present. -Encouraged to continue with insulin control of diabetes  -Patient is awaiting custom molded diabetic shoes and inserts -Patient to return to office in 2 weeks for follow up ulcer care and evaluation or sooner if problems arise. We'll start Oasis at next visit if wound is ready for graft and is free of infection.  [name], DPM

## 2016-12-14 ENCOUNTER — Ambulatory Visit: Payer: PPO | Admitting: Sports Medicine

## 2016-12-14 DIAGNOSIS — M24571 Contracture, right ankle: Secondary | ICD-10-CM | POA: Diagnosis not present

## 2016-12-14 DIAGNOSIS — L97512 Non-pressure chronic ulcer of other part of right foot with fat layer exposed: Secondary | ICD-10-CM | POA: Diagnosis not present

## 2016-12-14 DIAGNOSIS — M24871 Other specific joint derangements of right ankle, not elsewhere classified: Secondary | ICD-10-CM | POA: Diagnosis not present

## 2016-12-14 DIAGNOSIS — M79671 Pain in right foot: Secondary | ICD-10-CM | POA: Diagnosis not present

## 2017-01-03 DIAGNOSIS — L89612 Pressure ulcer of right heel, stage 2: Secondary | ICD-10-CM | POA: Diagnosis not present

## 2017-01-17 DIAGNOSIS — T8189XA Other complications of procedures, not elsewhere classified, initial encounter: Secondary | ICD-10-CM | POA: Diagnosis not present

## 2017-01-17 DIAGNOSIS — M25774 Osteophyte, right foot: Secondary | ICD-10-CM | POA: Diagnosis not present

## 2017-01-17 DIAGNOSIS — L89612 Pressure ulcer of right heel, stage 2: Secondary | ICD-10-CM | POA: Diagnosis not present

## 2017-02-01 DIAGNOSIS — L89612 Pressure ulcer of right heel, stage 2: Secondary | ICD-10-CM | POA: Diagnosis not present

## 2017-02-16 DIAGNOSIS — L97512 Non-pressure chronic ulcer of other part of right foot with fat layer exposed: Secondary | ICD-10-CM | POA: Diagnosis not present

## 2017-02-28 DIAGNOSIS — L97509 Non-pressure chronic ulcer of other part of unspecified foot with unspecified severity: Secondary | ICD-10-CM | POA: Diagnosis not present

## 2017-02-28 DIAGNOSIS — E11621 Type 2 diabetes mellitus with foot ulcer: Secondary | ICD-10-CM | POA: Diagnosis not present

## 2017-02-28 DIAGNOSIS — R200 Anesthesia of skin: Secondary | ICD-10-CM | POA: Diagnosis not present

## 2017-02-28 DIAGNOSIS — Z794 Long term (current) use of insulin: Secondary | ICD-10-CM | POA: Diagnosis not present

## 2017-03-14 DIAGNOSIS — E119 Type 2 diabetes mellitus without complications: Secondary | ICD-10-CM | POA: Diagnosis not present

## 2017-03-14 DIAGNOSIS — E785 Hyperlipidemia, unspecified: Secondary | ICD-10-CM | POA: Diagnosis not present

## 2017-03-14 DIAGNOSIS — E1159 Type 2 diabetes mellitus with other circulatory complications: Secondary | ICD-10-CM | POA: Diagnosis not present

## 2017-03-14 DIAGNOSIS — Z125 Encounter for screening for malignant neoplasm of prostate: Secondary | ICD-10-CM | POA: Diagnosis not present

## 2017-03-21 DIAGNOSIS — E1159 Type 2 diabetes mellitus with other circulatory complications: Secondary | ICD-10-CM | POA: Diagnosis not present

## 2017-03-21 DIAGNOSIS — L97509 Non-pressure chronic ulcer of other part of unspecified foot with unspecified severity: Secondary | ICD-10-CM | POA: Diagnosis not present

## 2017-03-21 DIAGNOSIS — E08621 Diabetes mellitus due to underlying condition with foot ulcer: Secondary | ICD-10-CM | POA: Diagnosis not present

## 2017-03-21 DIAGNOSIS — I10 Essential (primary) hypertension: Secondary | ICD-10-CM | POA: Diagnosis not present

## 2017-04-06 ENCOUNTER — Ambulatory Visit (INDEPENDENT_AMBULATORY_CARE_PROVIDER_SITE_OTHER): Payer: PPO | Admitting: Sports Medicine

## 2017-04-06 ENCOUNTER — Encounter: Payer: Self-pay | Admitting: Sports Medicine

## 2017-04-06 ENCOUNTER — Ambulatory Visit (INDEPENDENT_AMBULATORY_CARE_PROVIDER_SITE_OTHER): Payer: PPO

## 2017-04-06 VITALS — BP 150/93 | HR 89 | Temp 98.6°F | Resp 18

## 2017-04-06 DIAGNOSIS — L02619 Cutaneous abscess of unspecified foot: Secondary | ICD-10-CM | POA: Diagnosis not present

## 2017-04-06 DIAGNOSIS — L97411 Non-pressure chronic ulcer of right heel and midfoot limited to breakdown of skin: Secondary | ICD-10-CM

## 2017-04-06 DIAGNOSIS — M79671 Pain in right foot: Secondary | ICD-10-CM

## 2017-04-06 DIAGNOSIS — E1142 Type 2 diabetes mellitus with diabetic polyneuropathy: Secondary | ICD-10-CM

## 2017-04-06 DIAGNOSIS — L03119 Cellulitis of unspecified part of limb: Secondary | ICD-10-CM

## 2017-04-06 DIAGNOSIS — E08621 Diabetes mellitus due to underlying condition with foot ulcer: Secondary | ICD-10-CM | POA: Diagnosis not present

## 2017-04-06 DIAGNOSIS — Z89421 Acquired absence of other right toe(s): Secondary | ICD-10-CM

## 2017-04-06 MED ORDER — AMOXICILLIN-POT CLAVULANATE 875-125 MG PO TABS: 1.0000 | ORAL_TABLET | Freq: Two times a day (BID) | ORAL | 0 refills | Status: DC

## 2017-04-06 NOTE — Progress Notes (Signed)
Patient ID: DANIELE YANKOWSKI, male   DOB: [birth year], 66 y.o.   MRN: [id number]   Subjective: TERELLE DOBLER is a 66 y.o. Diabetic male patient seen in office for follow up evaluation of ulceration of the Right foot. Patient has a history of diabetes and a blood glucose level today not recorded; States he got busy with work and never followed up. Reports earlier this week taking some old antibiotics for redness around the foot. 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 Denies current nausea/fever/vomiting/chills/night sweats/shortness of breath/pain. Patient has no other pedal complaints at this time.   Patient Active Problem List   Diagnosis Date Noted  . Benign prostatic hyperplasia with urinary obstruction 09/20/2015  . Family history of malignant neoplasm of prostate 09/20/2015  . Excessive urination at night 09/20/2015   Current Outpatient Prescriptions on File Prior to Visit  Medication Sig Dispense Refill  . amLODipine-olmesartan (AZOR) 10-40 MG tablet     . aspirin EC 81 MG tablet Take by mouth.    Marland Kitchen atenolol (TENORMIN) 50 MG tablet Take 50 mg by mouth.    Marland Kitchen atorvastatin (LIPITOR) 10 MG tablet     . Cholecalciferol (VITAMIN D-1000 MAX ST) 1000 units tablet Take by mouth.    . finasteride (PROSCAR) 5 MG tablet     . LEVEMIR FLEXTOUCH 100 UNIT/ML Pen     . levofloxacin (LEVAQUIN) 500 MG tablet Take 1 tablet (500 mg total) by mouth daily. 14 tablet 0  . lisinopril (PRINIVIL,ZESTRIL) 20 MG tablet Take 20 mg by mouth.    . losartan-hydrochlorothiazide (HYZAAR) 100-25 MG tablet     . Omega-3 Fatty Acids (FISH OIL) 1000 MG CAPS Take by mouth.    . pioglitazone (ACTOS) 15 MG tablet     . silver sulfADIAZINE (SILVADENE) 1 % cream Apply 1 application topically daily. To right foot ulcer 50 g 1  . sulfamethoxazole-trimethoprim (BACTRIM DS,SEPTRA DS) 800-160 MG tablet Take 1 tablet by mouth 2 (two) times daily. 28 tablet 0  . VICTOZA 18 MG/3ML SOPN      No  current facility-administered medications on file prior to visit.    No Known Allergies  No results found for this or any previous visit (from the past 2160 hour(s)).  Objective: There were no vitals filed for this visit.  General: Patient is awake, alert, oriented x 3 and in no acute distress.  Dermatology: Skin is warm and dry bilateral with a ulceration present Right foot, plantar ulcer measure s 1.5 x 1.5 cm x 0.5 cm. There is a keratotic border with a fibrogranular base. The ulceration does not probe to bone. There is no malodor, no active drainage, mild blanchable dusky periwound erythema with warmth, no edema. No other acute signs of infection.   Vascular: Dorsalis Pedis pulse = 2/4 Bilateral,  Posterior Tibial pulse = 1/4 Bilateral,  Capillary Fill Time < 5 seconds  Neurologic: Protective sensation absent using the 5.07/10g Semmes Weinstein Monofilament.  Musculosketal: No Pain with palpation to ulcerated area. No pain with compression to calves bilateral. Right 3rd and 5th toe amputation status, fixed cavovarus foot.  Assessment and Plan:  Problem List Items Addressed This Visit    None    Visit Diagnoses    Cellulitis and abscess of foot    -  Primary   Relevant Medications   amoxicillin-clavulanate (AUGMENTIN) 875-125 MG tablet   Other Relevant Orders   WOUND CULTURE  Diabetic ulcer of right midfoot associated with diabetes mellitus due to underlying condition, limited to breakdown of skin (HCC)       Relevant Medications   amoxicillin-clavulanate (AUGMENTIN) 875-125 MG tablet   Other Relevant Orders   DG Foot Complete Right   Diabetic polyneuropathy associated with type 2 diabetes mellitus (HCC)       Relevant Medications   amoxicillin-clavulanate (AUGMENTIN) 875-125 MG tablet   Right foot pain       Relevant Medications   amoxicillin-clavulanate (AUGMENTIN) 875-125 MG tablet   Status post amputation of toe of right foot (HCC)       Relevant Medications    amoxicillin-clavulanate (AUGMENTIN) 875-125 MG tablet     -Examined patient and discussed the progression of the wound and treatment alternatives. - Excisionally dedbrided ulceration to healthy bleeding border; Wound culture obtained  -Applied antibiotic cream and offloading pad covered with 4 x 4's and Coban and instructed patient to continue with daily dressings at home consisting of the same; Refrain from walking and from getting dirt in wound. Advised patient to make sure he changes the dressing as instructed on a daily basis.  -Refilled Augmentin since periwound area has erythema and warmth -Recommend continue with post op offloading shoe.  - Advised patient to go to the ER or return to office if the wound worsens or if constitutional symptoms are present. -Encouraged to continue with insulin control of diabetes  -Patient to return to office in 2 weeks for follow up ulcer care and evaluation or sooner if problems arise. Once infection clears will plan to take patient to OR for removal of bone and possible graft.  [name], DPM

## 2017-04-10 ENCOUNTER — Telehealth: Payer: Self-pay | Admitting: *Deleted

## 2017-04-10 LAB — WOUND CULTURE
Gram Stain: NONE SEEN
Gram Stain: NONE SEEN

## 2017-04-10 MED ORDER — CIPROFLOXACIN HCL 500 MG PO TABS: 500.0000 mg | ORAL_TABLET | Freq: Two times a day (BID) | ORAL | 0 refills | Status: DC

## 2017-04-10 NOTE — Telephone Encounter (Addendum)
-----   Message from [name], [place] sent at 04/10/2017 12:24 PM EDT ----- Can you let patient know + culture of staph. Continue with Augmentin and also send to pharmacy Cipro 500 bid x 14 days Thanks Dr. Cannon Kettle. Unable to leave a message phone states pt is not available try again later. Left message with pt's mtr, Rod Holler to call our office and she states she doesn't know his phone number but knows where he lives and she will get in contact with him and give him the message. 04/25/2017-Dr. Stover's 04/25/2017 9:35am Orders called to pt and [facility] on [address].

## 2017-04-19 ENCOUNTER — Ambulatory Visit (INDEPENDENT_AMBULATORY_CARE_PROVIDER_SITE_OTHER): Payer: PPO | Admitting: Sports Medicine

## 2017-04-19 ENCOUNTER — Encounter: Payer: Self-pay | Admitting: Sports Medicine

## 2017-04-19 DIAGNOSIS — M79671 Pain in right foot: Secondary | ICD-10-CM

## 2017-04-19 DIAGNOSIS — Z89421 Acquired absence of other right toe(s): Secondary | ICD-10-CM

## 2017-04-19 DIAGNOSIS — E08621 Diabetes mellitus due to underlying condition with foot ulcer: Secondary | ICD-10-CM

## 2017-04-19 DIAGNOSIS — L97411 Non-pressure chronic ulcer of right heel and midfoot limited to breakdown of skin: Secondary | ICD-10-CM | POA: Diagnosis not present

## 2017-04-19 DIAGNOSIS — E1142 Type 2 diabetes mellitus with diabetic polyneuropathy: Secondary | ICD-10-CM

## 2017-04-19 DIAGNOSIS — L03119 Cellulitis of unspecified part of limb: Secondary | ICD-10-CM

## 2017-04-19 DIAGNOSIS — L02619 Cutaneous abscess of unspecified foot: Secondary | ICD-10-CM

## 2017-04-19 DIAGNOSIS — R7989 Other specified abnormal findings of blood chemistry: Secondary | ICD-10-CM | POA: Diagnosis not present

## 2017-04-19 NOTE — Progress Notes (Signed)
Patient ID: Aaron Mclean, male   DOB: 02/04/1951, 66 y.o.   MRN: [id number]   Subjective: Aaron Mclean is a 66 y.o. Diabetic male patient seen in office for follow up evaluation of ulceration of the Right foot. Patient has a history of diabetes and a blood glucose level today not recorded; States he has been taking antibiotics and it feels somewhat better. Denies current nausea/fever/vomiting/chills/night sweats/shortness of breath/pain. Patient has no other pedal complaints at this time.   Patient Active Problem List   Diagnosis Date Noted  . Benign prostatic hyperplasia with urinary obstruction 09/20/2015  . Family history of malignant neoplasm of prostate 09/20/2015  . Excessive urination at night 09/20/2015   Current Outpatient Prescriptions on File Prior to Visit  Medication Sig Dispense Refill  . amLODipine-olmesartan (AZOR) 10-40 MG tablet     . amoxicillin-clavulanate (AUGMENTIN) 875-125 MG tablet Take 1 tablet by mouth 2 (two) times daily. 28 tablet 0  . aspirin EC 81 MG tablet Take by mouth.    Marland Kitchen atenolol (TENORMIN) 50 MG tablet Take 50 mg by mouth.    Marland Kitchen atorvastatin (LIPITOR) 10 MG tablet     . Cholecalciferol (VITAMIN D-1000 MAX ST) 1000 units tablet Take by mouth.    . ciprofloxacin (CIPRO) 500 MG tablet Take 1 tablet (500 mg total) by mouth 2 (two) times daily. 28 tablet 0  . finasteride (PROSCAR) 5 MG tablet     . LEVEMIR FLEXTOUCH 100 UNIT/ML Pen     . levofloxacin (LEVAQUIN) 500 MG tablet Take 1 tablet (500 mg total) by mouth daily. 14 tablet 0  . lisinopril (PRINIVIL,ZESTRIL) 20 MG tablet Take 20 mg by mouth.    . losartan-hydrochlorothiazide (HYZAAR) 100-25 MG tablet     . Omega-3 Fatty Acids (FISH OIL) 1000 MG CAPS Take by mouth.    . pioglitazone (ACTOS) 15 MG tablet     . silver sulfADIAZINE (SILVADENE) 1 % cream Apply 1 application topically daily. To right foot ulcer 50 g 1  . sulfamethoxazole-trimethoprim (BACTRIM DS,SEPTRA DS) 800-160 MG tablet Take 1  tablet by mouth 2 (two) times daily. 28 tablet 0  . VICTOZA 18 MG/3ML SOPN      No current facility-administered medications on file prior to visit.    No Known Allergies  Recent Results (from the past 2160 hour(s))  WOUND CULTURE     Status: None   Collection Time: 04/06/17  2:54 PM  Result Value Ref Range   Culture Abundant STAPHYLOCOCCUS AUREUS    Gram Stain No WBC Seen    Gram Stain No Squamous Epithelial Cells Seen    Gram Stain Abundant Gram Positive Cocci In Pairs In Clusters    Organism ID, Bacteria STAPHYLOCOCCUS AUREUS     Comment: Rifampin and Gentamicin should not be used as single drugs for treatment of Staph infections.       Susceptibility   Staphylococcus aureus -  (no method available)    OXACILLIN <=0.25 Sensitive     CEFAZOLIN  Sensitive     GENTAMICIN <=0.5 Sensitive     CIPROFLOXACIN <=0.5 Sensitive     LEVOFLOXACIN 0.25 Sensitive     MOXIFLOXACIN <=0.25 Sensitive     TRIMETH/SULFA 20 Sensitive     VANCOMYCIN <=0.5 Sensitive     CLINDAMYCIN <=0.25 Sensitive     ERYTHROMYCIN <=0.25 Sensitive     LINEZOLID 2 Sensitive     QUINUPRISTIN/DALF <=0.25 Sensitive     RIFAMPIN <=0.5 Sensitive  TETRACYCLINE <=1 Sensitive     Objective: There were no vitals filed for this visit.  General: Patient is awake, alert, oriented x 3 and in no acute distress.  Dermatology: Skin is warm and dry bilateral with a ulceration present Right foot, plantar ulcer measure s 1.5 x 1.2 cm x 0.5 cm, Slightly smaller than previous. There is a keratotic border with a fibrogranular base. The ulceration does not probe to bone. There is no malodor, no active drainage, mild blanchable dusky periwound erythema with out warmth, no edema. No other acute signs of infection.   Vascular: Dorsalis Pedis pulse = 2/4 Bilateral,  Posterior Tibial pulse = 1/4 Bilateral,  Capillary Fill Time < 5 seconds  Neurologic: Protective sensation absent using the 5.07/10g Semmes Weinstein  Monofilament.  Musculosketal: No Pain with palpation to ulcerated area. No pain with compression to calves bilateral. Right 3rd and 5th toe amputation status, fixed cavovarus foot.  Assessment and Plan:  Problem List Items Addressed This Visit    None    Visit Diagnoses    Diabetic ulcer of right midfoot associated with diabetes mellitus due to underlying condition, limited to breakdown of skin ([place])    -  Primary   Cellulitis and abscess of foot       Diabetic polyneuropathy associated with type 2 diabetes mellitus ([place])       Right foot pain       Status post amputation of toe of right foot ([place])         -Examined patient and discussed the progression of the wound and treatment alternatives. - Excisionally dedbrided ulceration to healthy bleeding border; Wound culture obtained and sent to [facility]; will call patient if change in antibiotic is needed -Applied antibiotic cream and offloading pad covered with 4 x 4's and Coban and instructed patient to continue with daily dressings at home consisting of the same; Refrain from walking and from getting dirt in wound. Advised patient to make sure he changes the dressing as instructed on a daily basis.  -Continue with Augmentin until completed -Recommend continue with post op offloading shoe.  - Advised patient to go to the ER or return to office if the wound worsens or if constitutional symptoms are present. -Encouraged to continue with insulin control of diabetes  -Patient to return to office in 2 weeks for follow up ulcer care and evaluation or sooner if problems arise. Once infection clears will plan to take patient to OR for removal of bone and possible graft.  [name], DPM

## 2017-04-25 MED ORDER — METRONIDAZOLE 500 MG PO TABS: 500.0000 mg | ORAL_TABLET | Freq: Three times a day (TID) | ORAL | 0 refills | Status: DC

## 2017-04-25 MED ORDER — LEVOFLOXACIN 500 MG PO TABS: 500.0000 mg | ORAL_TABLET | Freq: Every day | ORAL | 0 refills | Status: DC

## 2017-04-25 MED ORDER — DOXYCYCLINE HYCLATE 100 MG PO CAPS: 100.0000 mg | ORAL_CAPSULE | Freq: Two times a day (BID) | ORAL | 0 refills | Status: DC

## 2017-04-25 NOTE — Telephone Encounter (Signed)
-----   Message from [name], [place] sent at 04/25/2017  9:35 AM EDT ----- Regarding: Send antibiotics  Send Doxycyline, Levaquin to pharmacy as well to patient, Also send Metronidzole 500mg  q8h x 14 days. He will be taking 3 antibiotics -Dr. Chauncey Cruel

## 2017-04-26 DIAGNOSIS — E08621 Diabetes mellitus due to underlying condition with foot ulcer: Secondary | ICD-10-CM | POA: Diagnosis not present

## 2017-04-26 DIAGNOSIS — R0602 Shortness of breath: Secondary | ICD-10-CM | POA: Diagnosis not present

## 2017-04-26 DIAGNOSIS — E1140 Type 2 diabetes mellitus with diabetic neuropathy, unspecified: Secondary | ICD-10-CM | POA: Diagnosis not present

## 2017-04-26 DIAGNOSIS — L97509 Non-pressure chronic ulcer of other part of unspecified foot with unspecified severity: Secondary | ICD-10-CM | POA: Diagnosis not present

## 2017-05-01 DIAGNOSIS — R079 Chest pain, unspecified: Secondary | ICD-10-CM | POA: Diagnosis not present

## 2017-05-01 DIAGNOSIS — R9431 Abnormal electrocardiogram [ECG] [EKG]: Secondary | ICD-10-CM | POA: Diagnosis not present

## 2017-05-01 DIAGNOSIS — R200 Anesthesia of skin: Secondary | ICD-10-CM | POA: Diagnosis not present

## 2017-05-01 DIAGNOSIS — R0602 Shortness of breath: Secondary | ICD-10-CM | POA: Diagnosis not present

## 2017-05-03 ENCOUNTER — Encounter: Payer: Self-pay | Admitting: Sports Medicine

## 2017-05-03 ENCOUNTER — Ambulatory Visit (INDEPENDENT_AMBULATORY_CARE_PROVIDER_SITE_OTHER): Payer: PPO | Admitting: Sports Medicine

## 2017-05-03 DIAGNOSIS — L97411 Non-pressure chronic ulcer of right heel and midfoot limited to breakdown of skin: Secondary | ICD-10-CM | POA: Diagnosis not present

## 2017-05-03 DIAGNOSIS — E08621 Diabetes mellitus due to underlying condition with foot ulcer: Secondary | ICD-10-CM | POA: Diagnosis not present

## 2017-05-03 DIAGNOSIS — L02619 Cutaneous abscess of unspecified foot: Secondary | ICD-10-CM

## 2017-05-03 DIAGNOSIS — M79671 Pain in right foot: Secondary | ICD-10-CM

## 2017-05-03 DIAGNOSIS — E1142 Type 2 diabetes mellitus with diabetic polyneuropathy: Secondary | ICD-10-CM

## 2017-05-03 DIAGNOSIS — L03119 Cellulitis of unspecified part of limb: Secondary | ICD-10-CM

## 2017-05-03 NOTE — Progress Notes (Signed)
Patient ID: Aaron Mclean, male   DOB: 25-May-1951, 66 y.o.   MRN: [id number]   Subjective: Aaron Mclean is a 66 y.o. Diabetic male patient seen in office for follow up evaluation of ulceration of the Right foot. Patient has a history of diabetes and a blood glucose level today not recorded; States he has to get cardiac cath. Denies current nausea/fever/vomiting/chills/night sweats/shortness of breath/pain. Patient has no other pedal complaints at this time.   Patient Active Problem List   Diagnosis Date Noted  . Benign prostatic hyperplasia with urinary obstruction 09/20/2015  . Family history of malignant neoplasm of prostate 09/20/2015  . Excessive urination at night 09/20/2015   Current Outpatient Prescriptions on File Prior to Visit  Medication Sig Dispense Refill  . amLODipine-olmesartan (AZOR) 10-40 MG tablet     . amoxicillin-clavulanate (AUGMENTIN) 875-125 MG tablet Take 1 tablet by mouth 2 (two) times daily. 28 tablet 0  . aspirin EC 81 MG tablet Take by mouth.    Marland Kitchen atenolol (TENORMIN) 50 MG tablet Take 50 mg by mouth.    Marland Kitchen atorvastatin (LIPITOR) 10 MG tablet     . Cholecalciferol (VITAMIN D-1000 MAX ST) 1000 units tablet Take by mouth.    . ciprofloxacin (CIPRO) 500 MG tablet Take 1 tablet (500 mg total) by mouth 2 (two) times daily. 28 tablet 0  . doxycycline (VIBRAMYCIN) 100 MG capsule Take 1 capsule (100 mg total) by mouth 2 (two) times daily. 28 capsule 0  . finasteride (PROSCAR) 5 MG tablet     . LEVEMIR FLEXTOUCH 100 UNIT/ML Pen     . levofloxacin (LEVAQUIN) 500 MG tablet Take 1 tablet (500 mg total) by mouth daily. 14 tablet 0  . levofloxacin (LEVAQUIN) 500 MG tablet Take 1 tablet (500 mg total) by mouth daily. 14 tablet 0  . lisinopril (PRINIVIL,ZESTRIL) 20 MG tablet Take 20 mg by mouth.    . losartan-hydrochlorothiazide (HYZAAR) 100-25 MG tablet     . metroNIDAZOLE (FLAGYL) 500 MG tablet Take 1 tablet (500 mg total) by mouth 3 (three) times daily. 42 tablet 0  .  Omega-3 Fatty Acids (FISH OIL) 1000 MG CAPS Take by mouth.    . pioglitazone (ACTOS) 15 MG tablet     . silver sulfADIAZINE (SILVADENE) 1 % cream Apply 1 application topically daily. To right foot ulcer 50 g 1  . sulfamethoxazole-trimethoprim (BACTRIM DS,SEPTRA DS) 800-160 MG tablet Take 1 tablet by mouth 2 (two) times daily. 28 tablet 0  . VICTOZA 18 MG/3ML SOPN      No current facility-administered medications on file prior to visit.    No Known Allergies  Recent Results (from the past 2160 hour(s))  WOUND CULTURE     Status: None   Collection Time: 04/06/17  2:54 PM  Result Value Ref Range   Culture Abundant STAPHYLOCOCCUS AUREUS    Gram Stain No WBC Seen    Gram Stain No Squamous Epithelial Cells Seen    Gram Stain Abundant Gram Positive Cocci In Pairs In Clusters    Organism ID, Bacteria STAPHYLOCOCCUS AUREUS     Comment: Rifampin and Gentamicin should not be used as single drugs for treatment of Staph infections.       Susceptibility   Staphylococcus aureus -  (no method available)    OXACILLIN <=0.25 Sensitive     CEFAZOLIN  Sensitive     GENTAMICIN <=0.5 Sensitive     CIPROFLOXACIN <=0.5 Sensitive     LEVOFLOXACIN 0.25 Sensitive  MOXIFLOXACIN <=0.25 Sensitive     TRIMETH/SULFA 20 Sensitive     VANCOMYCIN <=0.5 Sensitive     CLINDAMYCIN <=0.25 Sensitive     ERYTHROMYCIN <=0.25 Sensitive     LINEZOLID 2 Sensitive     QUINUPRISTIN/DALF <=0.25 Sensitive     RIFAMPIN <=0.5 Sensitive     TETRACYCLINE <=1 Sensitive     Objective: There were no vitals filed for this visit.  General: Patient is awake, alert, oriented x 3 and in no acute distress.  Dermatology: Skin is warm and dry bilateral with a ulceration present Right foot, plantar ulcer measures 1.1 x 1.2 cm x 0.5 cm, Slightly smaller than previous. There is a keratotic border with a fibrogranular base. The ulceration does not probe to bone. There is no malodor, no active drainage, mild blanchable dusky periwound  erythema with out warmth, no edema. No other acute signs of infection.   Vascular: Dorsalis Pedis pulse = 2/4 Bilateral,  Posterior Tibial pulse = 1/4 Bilateral,  Capillary Fill Time < 5 seconds  Neurologic: Protective sensation absent using the 5.07/10g Semmes Weinstein Monofilament.  Musculosketal: No Pain with palpation to ulcerated area. No pain with compression to calves bilateral. Right 3rd and 5th toe amputation status, fixed cavovarus foot.  Assessment and Plan:  Problem List Items Addressed This Visit    None    Visit Diagnoses    Diabetic ulcer of right midfoot associated with diabetes mellitus due to underlying condition, limited to breakdown of skin ([place])    -  Primary   Cellulitis and abscess of foot       improved   Diabetic polyneuropathy associated with type 2 diabetes mellitus ([place])       Right foot pain         -Examined patient and discussed the progression of the wound and treatment alternatives -Continue PO antibiotics - Excisionally dedbrided ulceration to healthy bleeding borders -Applied antibiotic cream and offloading pad covered with 4 x 4's and Coban and instructed patient to continue with daily dressings at home consisting of the same; Refrain from walking and from getting dirt in wound. Advised patient to make sure he changes the dressing as instructed on a daily basis.  -Recommend continue with post op offloading shoe.  - Advised patient to go to the ER or return to office if the wound worsens or if constitutional symptoms are present. -Encouraged to continue with insulin control of diabetes  -Patient to return to office in 2 weeks for follow up ulcer care and evaluation or sooner if problems arise. Once infection clears will plan to take patient to OR for removal of bone and possible graft.  [name], DPM

## 2017-05-10 ENCOUNTER — Other Ambulatory Visit: Payer: Self-pay | Admitting: Sports Medicine

## 2017-05-10 DIAGNOSIS — E1140 Type 2 diabetes mellitus with diabetic neuropathy, unspecified: Secondary | ICD-10-CM | POA: Diagnosis not present

## 2017-05-10 DIAGNOSIS — I5030 Unspecified diastolic (congestive) heart failure: Secondary | ICD-10-CM | POA: Diagnosis not present

## 2017-05-10 DIAGNOSIS — N529 Male erectile dysfunction, unspecified: Secondary | ICD-10-CM | POA: Diagnosis not present

## 2017-05-10 DIAGNOSIS — Z6828 Body mass index (BMI) 28.0-28.9, adult: Secondary | ICD-10-CM | POA: Diagnosis not present

## 2017-05-10 MED ORDER — LEVOFLOXACIN 500 MG PO TABS: 500.0000 mg | ORAL_TABLET | Freq: Every day | ORAL | 0 refills | Status: DC

## 2017-05-10 MED ORDER — METRONIDAZOLE 500 MG PO TABS: 500.0000 mg | ORAL_TABLET | Freq: Three times a day (TID) | ORAL | 0 refills | Status: DC

## 2017-05-10 MED ORDER — DOXYCYCLINE HYCLATE 100 MG PO CAPS: 100.0000 mg | ORAL_CAPSULE | Freq: Two times a day (BID) | ORAL | 0 refills | Status: DC

## 2017-05-10 NOTE — Progress Notes (Signed)
Patient reports that the dog got into his medication and he does not have any more antibiotics because the dog got into them before he could take them. Sent refills to his pharmacy. -Dr. Cannon Kettle

## 2017-05-17 ENCOUNTER — Ambulatory Visit: Payer: PPO | Admitting: Sports Medicine

## 2017-05-17 ENCOUNTER — Ambulatory Visit (INDEPENDENT_AMBULATORY_CARE_PROVIDER_SITE_OTHER): Payer: PPO | Admitting: Sports Medicine

## 2017-05-17 DIAGNOSIS — E1142 Type 2 diabetes mellitus with diabetic polyneuropathy: Secondary | ICD-10-CM

## 2017-05-17 DIAGNOSIS — E1159 Type 2 diabetes mellitus with other circulatory complications: Secondary | ICD-10-CM | POA: Diagnosis not present

## 2017-05-17 DIAGNOSIS — L97411 Non-pressure chronic ulcer of right heel and midfoot limited to breakdown of skin: Secondary | ICD-10-CM

## 2017-05-17 DIAGNOSIS — E08621 Diabetes mellitus due to underlying condition with foot ulcer: Secondary | ICD-10-CM | POA: Diagnosis not present

## 2017-05-17 DIAGNOSIS — M79671 Pain in right foot: Secondary | ICD-10-CM

## 2017-05-17 DIAGNOSIS — M898X9 Other specified disorders of bone, unspecified site: Secondary | ICD-10-CM

## 2017-05-17 MED ORDER — SILVER SULFADIAZINE 1 % EX CREA: 1.0000 "application " | TOPICAL_CREAM | Freq: Every day | CUTANEOUS | 1 refills | Status: DC

## 2017-05-17 NOTE — Progress Notes (Signed)
Patient ID: DORNELL GRASMICK, male   DOB: 11/01/1951, 66 y.o.   MRN: [id number]   Subjective: Aaron Mclean is a 66 y.o. Diabetic male patient seen in office for follow up evaluation of ulceration of the Right foot. Patient has a history of diabetes and a blood glucose level today not recorded; States he has to get cardiac cath. Denies current nausea/fever/vomiting/chills/night sweats/shortness of breath/pain. Patient has no other pedal complaints at this time.   Patient Active Problem List   Diagnosis Date Noted  . Benign prostatic hyperplasia with urinary obstruction 09/20/2015  . Family history of malignant neoplasm of prostate 09/20/2015  . Excessive urination at night 09/20/2015   Current Outpatient Prescriptions on File Prior to Visit  Medication Sig Dispense Refill  . amLODipine-olmesartan (AZOR) 10-40 MG tablet     . amoxicillin-clavulanate (AUGMENTIN) 875-125 MG tablet Take 1 tablet by mouth 2 (two) times daily. 28 tablet 0  . aspirin EC 81 MG tablet Take by mouth.    Marland Kitchen atenolol (TENORMIN) 50 MG tablet Take 50 mg by mouth.    Marland Kitchen atorvastatin (LIPITOR) 10 MG tablet     . Cholecalciferol (VITAMIN D-1000 MAX ST) 1000 units tablet Take by mouth.    . ciprofloxacin (CIPRO) 500 MG tablet Take 1 tablet (500 mg total) by mouth 2 (two) times daily. 28 tablet 0  . doxycycline (VIBRAMYCIN) 100 MG capsule Take 1 capsule (100 mg total) by mouth 2 (two) times daily. 28 capsule 0  . finasteride (PROSCAR) 5 MG tablet     . LEVEMIR FLEXTOUCH 100 UNIT/ML Pen     . levofloxacin (LEVAQUIN) 500 MG tablet Take 1 tablet (500 mg total) by mouth daily. 14 tablet 0  . lisinopril (PRINIVIL,ZESTRIL) 20 MG tablet Take 20 mg by mouth.    . losartan-hydrochlorothiazide (HYZAAR) 100-25 MG tablet     . metroNIDAZOLE (FLAGYL) 500 MG tablet Take 1 tablet (500 mg total) by mouth 3 (three) times daily. 42 tablet 0  . Omega-3 Fatty Acids (FISH OIL) 1000 MG CAPS Take by mouth.    . pioglitazone (ACTOS) 15 MG tablet      . sulfamethoxazole-trimethoprim (BACTRIM DS,SEPTRA DS) 800-160 MG tablet Take 1 tablet by mouth 2 (two) times daily. 28 tablet 0  . VICTOZA 18 MG/3ML SOPN      No current facility-administered medications on file prior to visit.    No Known Allergies  Recent Results (from the past 2160 hour(s))  WOUND CULTURE     Status: None   Collection Time: 04/06/17  2:54 PM  Result Value Ref Range   Culture Abundant STAPHYLOCOCCUS AUREUS    Gram Stain No WBC Seen    Gram Stain No Squamous Epithelial Cells Seen    Gram Stain Abundant Gram Positive Cocci In Pairs In Clusters    Organism ID, Bacteria STAPHYLOCOCCUS AUREUS     Comment: Rifampin and Gentamicin should not be used as single drugs for treatment of Staph infections.       Susceptibility   Staphylococcus aureus -  (no method available)    OXACILLIN <=0.25 Sensitive     CEFAZOLIN  Sensitive     GENTAMICIN <=0.5 Sensitive     CIPROFLOXACIN <=0.5 Sensitive     LEVOFLOXACIN 0.25 Sensitive     MOXIFLOXACIN <=0.25 Sensitive     TRIMETH/SULFA 20 Sensitive     VANCOMYCIN <=0.5 Sensitive     CLINDAMYCIN <=0.25 Sensitive     ERYTHROMYCIN <=0.25 Sensitive     LINEZOLID 2  Sensitive     QUINUPRISTIN/DALF <=0.25 Sensitive     RIFAMPIN <=0.5 Sensitive     TETRACYCLINE <=1 Sensitive     Objective: There were no vitals filed for this visit.  General: Patient is awake, alert, oriented x 3 and in no acute distress.  Dermatology: Skin is warm and dry bilateral with a ulceration present Right foot, plantar ulcer measures 1.1 x 1.2 cm x 0.5 cm, Slightly smaller than previous. There is a keratotic border with a fibrogranular base. The ulceration does not probe to bone. There is no malodor, no active drainage, mild blanchable dusky periwound erythema with out warmth, no edema. No other acute signs of infection.   Vascular: Dorsalis Pedis pulse = 2/4 Bilateral,  Posterior Tibial pulse = 1/4 Bilateral,  Capillary Fill Time < 5 seconds  Neurologic:  Protective sensation absent using the 5.07/10g Semmes Weinstein Monofilament.  Musculosketal: No Pain with palpation to ulcerated area. No pain with compression to calves bilateral. Right 3rd and 5th toe amputation status, fixed cavovarus foot.  Assessment and Plan:  Problem List Items Addressed This Visit    None    Visit Diagnoses    Diabetic ulcer of right midfoot associated with diabetes mellitus due to underlying condition, limited to breakdown of skin ([place])    -  Primary   Relevant Medications   silver sulfADIAZINE (SILVADENE) 1 % cream   Other Relevant Orders   DME Crutches   Exostosis       Diabetic polyneuropathy associated with type 2 diabetes mellitus ([place])       Right foot pain         -Examined patient and discussed the progression of the wound and treatment alternatives -Continue PO antibiotics until completed - Excisionally dedbrided ulceration to healthy bleeding borders -Applied Silvadene cream and offloading pad covered with 4 x 4's and Coban and instructed patient to continue with daily dressings at home consisting of the same; Refrain from walking and from getting dirt in wound. Advised patient to make sure he changes the dressing as instructed on a daily basis.  -Recommend continue with post op offloading shoe.  - Advised patient to go to the ER or return to office if the wound worsens or if constitutional symptoms are present. -Encouraged to continue with insulin control of diabetes  -Patient opt for surgical management. Consent obtained for removal of bone and wound debridement on right. Pre and Post op course explained. Risks, benefits, alternatives explained. No guarantees given or implied. Surgical booking slip submitted and provided patient with Surgical packet and info for [facility]. H&P form given.  -Dispensed surgical shoe to use post op and Rx Crutches -Patient to return to office after surgery.   [name], DPM

## 2017-05-17 NOTE — Patient Instructions (Signed)
Pre-Operative Instructions  Congratulations, you have decided to take an important step to improving your quality of life.  You can be assured that the doctors of [facility] will be with you every step of the way.  1. Plan to be at the surgery center/hospital at least 1 (one) hour prior to your scheduled time unless otherwise directed by the surgical center/hospital staff.  You must have a responsible adult accompany you, remain during the surgery and drive you home.  Make sure you have directions to the surgical center/hospital and know how to get there on time. 2. For hospital based surgery you will need to obtain a history and physical form from your family physician within 1 month prior to the date of surgery- we will give you a form for you primary physician.  3. We make every effort to accommodate the date you request for surgery.  There are however, times where surgery dates or times have to be moved.  We will contact you as soon as possible if a change in schedule is required.   4. No Aspirin/Ibuprofen for one week before surgery.  If you are on aspirin, any non-steroidal anti-inflammatory medications (Mobic, Aleve, Ibuprofen) you should stop taking it 7 days prior to your surgery.  You make take Tylenol  For pain prior to surgery.  5. Medications- If you are taking daily heart and blood pressure medications, seizure, reflux, allergy, asthma, anxiety, pain or diabetes medications, make sure the surgery center/hospital is aware before the day of surgery so they may notify you which medications to take or avoid the day of surgery. 6. No food or drink after midnight the night before surgery unless directed otherwise by surgical center/hospital staff. 7. No alcoholic beverages 24 hours prior to surgery.  No smoking 24 hours prior to or 24 hours after surgery. 8. Wear loose pants or shorts- loose enough to fit over bandages, boots, and casts. 9. No slip on shoes, sneakers are best. 10. Bring  your boot with you to the surgery center/hospital.  Also bring crutches or a walker if your physician has prescribed it for you.  If you do not have this equipment, it will be provided for you after surgery. 11. If you have not been contracted by the surgery center/hospital by the day before your surgery, call to confirm the date and time of your surgery. 12. Leave-time from work may vary depending on the type of surgery you have.  Appropriate arrangements should be made prior to surgery with your employer. 13. Prescriptions will be provided immediately following surgery by your doctor.  Have these filled as soon as possible after surgery and take the medication as directed. 14. Remove nail polish on the operative foot. 15. Wash the night before surgery.  The night before surgery wash the foot and leg well with the antibacterial soap provided and water paying special attention to beneath the toenails and in between the toes.  Rinse thoroughly with water and dry well with a towel.  Perform this wash unless told not to do so by your physician.  Enclosed: 1 Ice pack (please put in freezer the night before surgery)   1 Hibiclens skin cleaner   Pre-op Instructions  If you have any questions regarding the instructions, do not hesitate to call our office.  [place]: [address] [place], [place] [address] [phone]  [place]: [address] [place], [place] [address] [phone]  [place]: [address]  [place], [place] [address] [phone]   Dr.   Norman Regal DPM, Dr. Matthew Wagoner DPM, Dr. M. Todd Hyatt DPM, Dr. Maliq Pilley DPM 

## 2017-05-24 DIAGNOSIS — Z6826 Body mass index (BMI) 26.0-26.9, adult: Secondary | ICD-10-CM | POA: Diagnosis not present

## 2017-05-24 DIAGNOSIS — Z1389 Encounter for screening for other disorder: Secondary | ICD-10-CM | POA: Diagnosis not present

## 2017-05-24 DIAGNOSIS — F3160 Bipolar disorder, current episode mixed, unspecified: Secondary | ICD-10-CM | POA: Diagnosis not present

## 2017-05-24 DIAGNOSIS — F4010 Social phobia, unspecified: Secondary | ICD-10-CM | POA: Diagnosis not present

## 2017-05-24 DIAGNOSIS — F419 Anxiety disorder, unspecified: Secondary | ICD-10-CM | POA: Diagnosis not present

## 2017-05-24 DIAGNOSIS — E119 Type 2 diabetes mellitus without complications: Secondary | ICD-10-CM | POA: Diagnosis not present

## 2017-05-24 DIAGNOSIS — F1590 Other stimulant use, unspecified, uncomplicated: Secondary | ICD-10-CM | POA: Diagnosis not present

## 2017-05-24 DIAGNOSIS — I5030 Unspecified diastolic (congestive) heart failure: Secondary | ICD-10-CM | POA: Diagnosis not present

## 2017-05-24 DIAGNOSIS — I10 Essential (primary) hypertension: Secondary | ICD-10-CM | POA: Diagnosis not present

## 2017-05-29 DIAGNOSIS — J342 Deviated nasal septum: Secondary | ICD-10-CM | POA: Diagnosis not present

## 2017-05-29 DIAGNOSIS — R0981 Nasal congestion: Secondary | ICD-10-CM | POA: Diagnosis not present

## 2017-05-29 DIAGNOSIS — R0683 Snoring: Secondary | ICD-10-CM | POA: Diagnosis not present

## 2017-05-29 DIAGNOSIS — Z7982 Long term (current) use of aspirin: Secondary | ICD-10-CM | POA: Diagnosis not present

## 2017-05-29 DIAGNOSIS — J3489 Other specified disorders of nose and nasal sinuses: Secondary | ICD-10-CM | POA: Diagnosis not present

## 2017-05-29 DIAGNOSIS — I4891 Unspecified atrial fibrillation: Secondary | ICD-10-CM | POA: Diagnosis not present

## 2017-06-07 DIAGNOSIS — L97509 Non-pressure chronic ulcer of other part of unspecified foot with unspecified severity: Secondary | ICD-10-CM | POA: Diagnosis not present

## 2017-06-07 DIAGNOSIS — Z01818 Encounter for other preprocedural examination: Secondary | ICD-10-CM | POA: Diagnosis not present

## 2017-06-07 DIAGNOSIS — E08621 Diabetes mellitus due to underlying condition with foot ulcer: Secondary | ICD-10-CM | POA: Diagnosis not present

## 2017-06-07 DIAGNOSIS — Z139 Encounter for screening, unspecified: Secondary | ICD-10-CM | POA: Diagnosis not present

## 2017-06-07 DIAGNOSIS — Z1389 Encounter for screening for other disorder: Secondary | ICD-10-CM | POA: Diagnosis not present

## 2017-06-14 ENCOUNTER — Telehealth: Payer: Self-pay | Admitting: *Deleted

## 2017-06-14 DIAGNOSIS — Z6826 Body mass index (BMI) 26.0-26.9, adult: Secondary | ICD-10-CM | POA: Diagnosis not present

## 2017-06-14 DIAGNOSIS — Z01818 Encounter for other preprocedural examination: Secondary | ICD-10-CM | POA: Diagnosis not present

## 2017-06-14 NOTE — Telephone Encounter (Signed)
"  I'm a patient of Dr. Cannon Kettle.  She told me to call you to set up my surgery.  If you have it done at [facility], her next available date will be August 9.  "I don't want to wait that long.  I am ready to get rid of this Ulcer."  She can do it sooner if you have it done at [facility].  "I want to do it sooner.  I can ask my brother to bring me."  She can do it on July 23.  "That sounds good go ahead and put me down for that date.  What time will I need to be there?"  It will be sometime that morning.  Someone from the surgical center will give you a call with the arrival time the Friday before the surgery date.  Can you go by the [facility] office and pick up the brochure for the surgical center?  You will need to register with the surgical center.  Instructions on how to do that is in the brochure.  "I am here now.  I will ask for it."

## 2017-06-26 ENCOUNTER — Telehealth: Payer: Self-pay | Admitting: *Deleted

## 2017-06-26 ENCOUNTER — Encounter (HOSPITAL_BASED_OUTPATIENT_CLINIC_OR_DEPARTMENT_OTHER): Payer: Self-pay | Admitting: *Deleted

## 2017-06-26 NOTE — Telephone Encounter (Signed)
"  I'm just calling to make sure this was a legitimate call.  I have been harassed by a lady named Aaron Mclean.  I went out with her daughter about 50 years ago.  He daughter was a little too fast for.  We went out on a date and she took off all her clothes.  I didn't like that.  I cut all ties with her.  Her mother got mad at me and she has been stalking me every since then.  I guess she can see in the [facility] system because she follows me everywhere I go.  I just wanted to make sure.  I look and there she is sitting across from me steering.  She probably done killed a lot of people over there at [facility]."  I assure you it was me calling.  You do not have to worry.  "Okay, I just wanted to make sure."

## 2017-06-26 NOTE — Telephone Encounter (Signed)
Caren Griffins from [facility] said that patient's surgery can't be done there due to the cost of the graft.  I called and scheduled surgery for [place] on the same day, 07/02/2017 at 7:30 am.  Delilah Shan schedule it for me.  I called and informed the patient.  He was okay with it.  He said he had his forms filled out by his primary care physician at the end of June.  He said he would give it to them the day of surgery.

## 2017-06-26 NOTE — Telephone Encounter (Signed)
"  I saw your note in there that said he was going to bring his forms the day of surgery.  We have to have it before surgery date.  Get him to fax it to the surgery center at [phone] or he can fax it to you all then you can fax it to [place].  You can reach me at [phone]."

## 2017-06-27 NOTE — Telephone Encounter (Signed)
"  This is Ebony Hail calling on behalf of Aaron Mclean.  She wanted me to let you know we are not going to be able to do Aaron Mclean here.  He has an Ejection Fraction of 37% and he has a strong Cardiac history."  So, I need to reschedule him for the main OR?  "Yes, that is correct."  Okay, I will get it rescheduled.  I called and spoke to Franco Collet Surgery Scheduler, and rescheduled the surgery to the hospital main outpatient surgery facility.  It will be at the same time just different location.  I attempted to call the patient to inform him about the change in location and to get him to take his history and physical form by the [facility] office, so they can fax it to Short Stay at [facility].  I could not leave a message, I got a message that said he was not able to take this call at this time.  I will try again later.

## 2017-06-28 ENCOUNTER — Encounter (HOSPITAL_BASED_OUTPATIENT_CLINIC_OR_DEPARTMENT_OTHER): Payer: Self-pay | Admitting: *Deleted

## 2017-06-28 ENCOUNTER — Telehealth: Payer: Self-pay | Admitting: *Deleted

## 2017-06-28 NOTE — Telephone Encounter (Signed)
I am calling to let you know we had to change the location of your surgery again.  I am so sorry.  They moved you to [facility].  The time is the same.  Someone from the hospital will give you a call to ask some questions and give you your arrival time.  "So it's going to be done at the hospital.  What time do I need to be there?"  I think you will need to be there at 6:00 am but they will let you know for sure when they call you.  "Okay, thank you so much."  Also they said you need to fax over your History and Physical form.  Can you take it by the [place] office so they can fax it to the surgical center?  "I will put my shoes on and take it right now."  They may be at lunch right now.  Wait until around 1:15pm.  "Okay, I sure will."  (Melody or Malachy Mood please fax H&P to [employer] [facility] OR) - [phone]   Thank you.

## 2017-06-28 NOTE — Progress Notes (Signed)
Pt states he was diagnosed with A-fib this year, had 3 episodes the first of the year. After being on Bystolic he states he's not had anymore episodes.Denies chest pain or sob.  Pt is diabetic, type 2. He states he has stopped taking his Levemir because he has lost weight and his blood sugar is doing ok. He states he has one pen left and only uses it if he "thinks" he needs it. Has not had Victoza refilled because it is too expensive. Pt instructed to check his blood sugar Monday AM when he gets up. If blood sugar is 70 or below, treat with 1/2 cup of clear juice (apple or cranberry) and recheck blood sugar 15 minutes after drinking juice.

## 2017-06-28 NOTE — Telephone Encounter (Signed)
"  I see where someone was supposed to send a fax of his H&P.  You gave them the Main OR number to fax it to.  I was wondering if you could get them to fax it to [place].  Our number is [phone] attention Helene Kelp

## 2017-06-28 NOTE — Progress Notes (Addendum)
Anesthesia Chart Review: SAME DAY WORK-UP.  Patient is a 66 year old male scheduled for torsal exostectomy fifth metatarsal base wound debridement and graft application on 2/50/53 by Dr. [name]. (Procedure was initially planned for either [facility] or [facility] but [facility] date was too far out and [place] would not do case there due to cost of the graft. Case was then moved to [facility], but was moved from there to [facility] Main OR due to patient having an EF < 40%.)  Patient has clearance from Ann Held, PA-C at [facility] from 06/14/17 pending his CXR results that has not been done yet. (Notes from 3/21/8, 03/21/17, 04/26/17, 05/10/17, 05/24/17, 06/07/17, and 06/14/17 received.)  History (by patient and per PCP notes) includes HTN, DM2 with neuropathy, CKD stage III, murmur, diastolic CHF, ED, social anxiety,  OSA (not using CPAP), right toe amputations. Evidence of old MI on recent stress test (and LV dysfunction, although EF does not appear to have been confirmed with echo). He reported that he is a former smoker. PCP notes list that he smokes a cigar once a week. He reported history of PAF X 3 in early 2018, with none since starting on b-blocker therapy. PCP notes do not mention any history of afib, only in 02/28/17 note there is mention of palpitations and heart rate feeling irregular at home X 2. EKG then had PACs and PVCs. EKG tracings received do not show afib.   Meds currently listed include ASA 81 mg, finasteride, Levemir, losartan-HCTZ, nebivolol, Silvadene cream, spironolactone, Bactrim DS. He told our PAT phone RN that he is not currently taking Victoza (can't afford) and is only taking Levemir if his CBGs are elevated.    EKG 06/07/17 [name]): NSR, LAD, nonspecific intraventricular conduction delay, ST/T wave abnormality, consider lateral ischemia.   Nuclear stress test 05/01/17 [facility]; ordered by Ann Held, PA-C):  Impression: 1. No evidence of ischemia on the scan. Fixed defect in the inferior segment most likely from scar from old myocardial infarction. 2. Markedly hypokinetic inferior segment of the left ventricle with ejection fraction calculated at 37%. 3. No significant symptoms, EKG changes, or arrhythmias occurred.  No echo on file at [facility]. No echo at [facility].   06/07/17 labs from [facility] showed WBC 5.1, hemoglobin 14.1, hematocrit 40.7, platelets 245K. glucose 133, BUN 26, creatinine 1.57, eGFR 52, sodium 136, potassium 4.2, CO2 23, calcium 9.6, AST 23, AST 20, albumin 4.1, total protein 6.9. PT 11.2, INR 1.1. Hemoglobin A1c 6.1.  Discussed above with anesthesiologist Dr. Smith Robert. By stress test results, patient likely has CAD with evidence of old MI. It does not appear that he has been referred to cardiology at this point; however, his recent stress test was non-ischemic and EF > 35%. He is on ARB and b-blocker therapy. There is no mention of afib in multiple PCP notes, although with a few episodes at home where his heart rate felt irregular. By notes, EKG then showed some PACs and PVCs. Patient reports episodes resolved after starting b-blocker. EKG on 06/07/17 showed SR without ectopy. He was given medical clearance for surgery by his PCP Tiffany Lowder, PA-C. She did recommend a preoperative CXR. Anesthesiologist to evaluate on the day of surgery to determine definitive anesthesia plan. May be able to do procedure with regional and MAC anesthesia.   George Hugh [facility] Short Stay Center/Anesthesiology Phone [phone] 06/29/2017 9:36 AM

## 2017-06-29 ENCOUNTER — Encounter (HOSPITAL_BASED_OUTPATIENT_CLINIC_OR_DEPARTMENT_OTHER): Payer: Self-pay | Admitting: Vascular Surgery

## 2017-07-01 NOTE — Anesthesia Preprocedure Evaluation (Addendum)
Anesthesia Evaluation  Patient identified by MRN, date of birth, ID band Patient awake    Reviewed: Allergy & Precautions, NPO status , Patient's Chart, lab work & pertinent test results  History of Anesthesia Complications Negative for: history of anesthetic complications  Airway Mallampati: II  TM Distance: >3 FB Neck ROM: Full    Dental  (+) Dental Advisory Given, Chipped   Pulmonary sleep apnea , former smoker,    breath sounds clear to auscultation       Cardiovascular hypertension, Pt. on medications and Pt. on home beta blockers (-) angina+ dysrhythmias (well controlled with bistolic) Atrial Fibrillation  Rhythm:Regular Rate:Normal  5/18 stress: no ischemia, old inferior scar, EF 37%   Neuro/Psych Diabetic peripheral neuropathy    GI/Hepatic Neg liver ROS, GERD  Poorly Controlled,  Endo/Other  diabetes (glu 126), Insulin Dependent  Renal/GU Renal InsufficiencyRenal disease     Musculoskeletal   Abdominal   Peds  Hematology   Anesthesia Other Findings   Reproductive/Obstetrics                            Anesthesia Physical Anesthesia Plan  ASA: III  Anesthesia Plan: MAC   Post-op Pain Management:    Induction:   PONV Risk Score and Plan: 1 and Ondansetron and Dexamethasone  Airway Management Planned: Natural Airway and Nasal Cannula  Additional Equipment:   Intra-op Plan:   Post-operative Plan:   Informed Consent: I have reviewed the patients History and Physical, chart, labs and discussed the procedure including the risks, benefits and alternatives for the proposed anesthesia with the patient or authorized representative who has indicated his/her understanding and acceptance.   Dental advisory given  Plan Discussed with: CRNA and Surgeon  Anesthesia Plan Comments: (Plan routine monitors, MAC)        Anesthesia Quick Evaluation

## 2017-07-02 ENCOUNTER — Ambulatory Visit (HOSPITAL_COMMUNITY): Payer: PPO

## 2017-07-02 ENCOUNTER — Encounter (HOSPITAL_COMMUNITY): Payer: Self-pay | Admitting: Certified Registered"

## 2017-07-02 ENCOUNTER — Ambulatory Visit (HOSPITAL_COMMUNITY)
Admission: RE | Admit: 2017-07-02 | Discharge: 2017-07-02 | Disposition: A | Discharge status: Home / Self Care | Payer: PPO | Source: Ambulatory Visit | Attending: Sports Medicine | Admitting: Sports Medicine

## 2017-07-02 ENCOUNTER — Encounter (HOSPITAL_COMMUNITY)
Admission: RE | Disposition: A | Discharge status: Home / Self Care | Payer: Self-pay | Source: Ambulatory Visit | Attending: Sports Medicine

## 2017-07-02 ENCOUNTER — Ambulatory Visit (HOSPITAL_COMMUNITY): Payer: PPO | Admitting: Vascular Surgery

## 2017-07-02 ENCOUNTER — Telehealth: Payer: Self-pay | Admitting: Sports Medicine

## 2017-07-02 DIAGNOSIS — M879 Osteonecrosis, unspecified: Secondary | ICD-10-CM | POA: Diagnosis not present

## 2017-07-02 DIAGNOSIS — I4891 Unspecified atrial fibrillation: Secondary | ICD-10-CM | POA: Diagnosis not present

## 2017-07-02 DIAGNOSIS — Z7982 Long term (current) use of aspirin: Secondary | ICD-10-CM | POA: Insufficient documentation

## 2017-07-02 DIAGNOSIS — N400 Enlarged prostate without lower urinary tract symptoms: Secondary | ICD-10-CM | POA: Diagnosis not present

## 2017-07-02 DIAGNOSIS — E11621 Type 2 diabetes mellitus with foot ulcer: Secondary | ICD-10-CM | POA: Insufficient documentation

## 2017-07-02 DIAGNOSIS — E1142 Type 2 diabetes mellitus with diabetic polyneuropathy: Secondary | ICD-10-CM | POA: Diagnosis not present

## 2017-07-02 DIAGNOSIS — S91301A Unspecified open wound, right foot, initial encounter: Secondary | ICD-10-CM | POA: Diagnosis not present

## 2017-07-02 DIAGNOSIS — Z794 Long term (current) use of insulin: Secondary | ICD-10-CM | POA: Insufficient documentation

## 2017-07-02 DIAGNOSIS — Z9889 Other specified postprocedural states: Secondary | ICD-10-CM

## 2017-07-02 DIAGNOSIS — Z87891 Personal history of nicotine dependence: Secondary | ICD-10-CM | POA: Insufficient documentation

## 2017-07-02 DIAGNOSIS — Z79899 Other long term (current) drug therapy: Secondary | ICD-10-CM | POA: Insufficient documentation

## 2017-07-02 DIAGNOSIS — M87874 Other osteonecrosis, right foot: Secondary | ICD-10-CM | POA: Diagnosis not present

## 2017-07-02 DIAGNOSIS — L97519 Non-pressure chronic ulcer of other part of right foot with unspecified severity: Secondary | ICD-10-CM | POA: Insufficient documentation

## 2017-07-02 DIAGNOSIS — M25776 Osteophyte, unspecified foot: Secondary | ICD-10-CM | POA: Diagnosis not present

## 2017-07-02 DIAGNOSIS — I10 Essential (primary) hypertension: Secondary | ICD-10-CM | POA: Diagnosis not present

## 2017-07-02 DIAGNOSIS — E1169 Type 2 diabetes mellitus with other specified complication: Secondary | ICD-10-CM | POA: Diagnosis not present

## 2017-07-02 DIAGNOSIS — L03818 Cellulitis of other sites: Secondary | ICD-10-CM | POA: Diagnosis not present

## 2017-07-02 HISTORY — DX: Chronic kidney disease, unspecified: N18.9

## 2017-07-02 HISTORY — DX: Constipation, unspecified: K59.00

## 2017-07-02 HISTORY — PX: WOUND DEBRIDEMENT: SHX247

## 2017-07-02 HISTORY — DX: Unspecified osteoarthritis, unspecified site: M19.90

## 2017-07-02 HISTORY — DX: Unspecified hemorrhoids: K64.9

## 2017-07-02 HISTORY — DX: Sleep apnea, unspecified: G47.30

## 2017-07-02 HISTORY — DX: Unspecified atrial fibrillation: I48.91

## 2017-07-02 HISTORY — PX: EXOSTECTECTOMY TOE: SHX6618

## 2017-07-02 HISTORY — DX: Essential (primary) hypertension: I10

## 2017-07-02 HISTORY — DX: Cardiac arrhythmia, unspecified: I49.9

## 2017-07-02 HISTORY — DX: Type 2 diabetes mellitus without complications: E11.9

## 2017-07-02 HISTORY — DX: Polyneuropathy, unspecified: G62.9

## 2017-07-02 HISTORY — PX: GRAFT APPLICATION: SHX6696

## 2017-07-02 LAB — BASIC METABOLIC PANEL
ANION GAP: 9 (ref 5–15)
BUN: 25 mg/dL — ABNORMAL HIGH (ref 6–20)
CHLORIDE: 102 mmol/L (ref 101–111)
CO2: 23 mmol/L (ref 22–32)
Calcium: 9.5 mg/dL (ref 8.9–10.3)
Creatinine, Ser: 1.52 mg/dL — ABNORMAL HIGH (ref 0.61–1.24)
GFR calc Af Amer: 53 mL/min — ABNORMAL LOW (ref >60)
GFR calc non Af Amer: 46 mL/min — ABNORMAL LOW (ref >60)
GLUCOSE: 133 mg/dL — AB (ref 65–99)
POTASSIUM: 4 mmol/L (ref 3.5–5.1)
Sodium: 134 mmol/L — ABNORMAL LOW (ref 135–145)

## 2017-07-02 LAB — CBC
HEMATOCRIT: 36.8 % — AB (ref 39.0–52.0)
HEMOGLOBIN: 13.1 g/dL (ref 13.0–17.0)
MCH: 28.2 pg (ref 26.0–34.0)
MCHC: 35.6 g/dL (ref 30.0–36.0)
MCV: 79.3 fL (ref 78.0–100.0)
Platelets: 224 10*3/uL (ref 150–400)
RBC: 4.64 MIL/uL (ref 4.22–5.81)
RDW: 13.6 % (ref 11.5–15.5)
WBC: 6.2 10*3/uL (ref 4.0–10.5)

## 2017-07-02 LAB — GLUCOSE, CAPILLARY
Glucose-Capillary: 126 mg/dL — ABNORMAL HIGH (ref 65–99)
Glucose-Capillary: 129 mg/dL — ABNORMAL HIGH (ref 65–99)

## 2017-07-02 SURGERY — EXCISION, EXOSTOSIS, TOE
Anesthesia: Monitor Anesthesia Care | Site: Foot | Laterality: Right

## 2017-07-02 MED ORDER — PHENYLEPHRINE 40 MCG/ML (10ML) SYRINGE FOR IV PUSH (FOR BLOOD PRESSURE SUPPORT)
PREFILLED_SYRINGE | INTRAVENOUS | Status: DC | PRN
  Administered 2017-07-02 (×2): 80 ug via INTRAVENOUS

## 2017-07-02 MED ORDER — LIDOCAINE HCL (CARDIAC) 20 MG/ML IV SOLN
INTRAVENOUS | Status: AC
  Filled 2017-07-02: qty 5

## 2017-07-02 MED ORDER — MEPERIDINE HCL 25 MG/ML IJ SOLN: 6.2500 mg | INTRAMUSCULAR | Status: DC | PRN

## 2017-07-02 MED ORDER — CEFAZOLIN SODIUM-DEXTROSE 2-4 GM/100ML-% IV SOLN
INTRAVENOUS | Status: AC
  Filled 2017-07-02: qty 100

## 2017-07-02 MED ORDER — PROPOFOL 500 MG/50ML IV EMUL
INTRAVENOUS | Status: DC | PRN
  Administered 2017-07-02: 50 ug/kg/min via INTRAVENOUS

## 2017-07-02 MED ORDER — DEXAMETHASONE SODIUM PHOSPHATE 10 MG/ML IJ SOLN
INTRAMUSCULAR | Status: DC | PRN
  Administered 2017-07-02: 4 mg via INTRAVENOUS

## 2017-07-02 MED ORDER — PROPOFOL 10 MG/ML IV BOLUS
INTRAVENOUS | Status: AC
  Filled 2017-07-02: qty 20

## 2017-07-02 MED ORDER — CHLORHEXIDINE GLUCONATE CLOTH 2 % EX PADS: 6.0000 | MEDICATED_PAD | Freq: Once | CUTANEOUS | Status: DC

## 2017-07-02 MED ORDER — PROMETHAZINE HCL 25 MG/ML IJ SOLN: 6.2500 mg | INTRAMUSCULAR | Status: DC | PRN

## 2017-07-02 MED ORDER — LACTATED RINGERS IV SOLN
INTRAVENOUS | Status: DC | PRN
  Administered 2017-07-02: 07:00:00 via INTRAVENOUS

## 2017-07-02 MED ORDER — MIDAZOLAM HCL 5 MG/5ML IJ SOLN
INTRAMUSCULAR | Status: DC | PRN
  Administered 2017-07-02: 2 mg via INTRAVENOUS

## 2017-07-02 MED ORDER — ONDANSETRON HCL 4 MG/2ML IJ SOLN
INTRAMUSCULAR | Status: AC
  Filled 2017-07-02: qty 2

## 2017-07-02 MED ORDER — HYDROMORPHONE HCL 1 MG/ML IJ SOLN: 0.2500 mg | INTRAMUSCULAR | Status: DC | PRN

## 2017-07-02 MED ORDER — DEXAMETHASONE SODIUM PHOSPHATE 10 MG/ML IJ SOLN
INTRAMUSCULAR | Status: AC
  Filled 2017-07-02: qty 1

## 2017-07-02 MED ORDER — LIDOCAINE HCL (PF) 1 % IJ SOLN
INTRAMUSCULAR | Status: AC
  Filled 2017-07-02: qty 30

## 2017-07-02 MED ORDER — FENTANYL CITRATE (PF) 100 MCG/2ML IJ SOLN
INTRAMUSCULAR | Status: DC | PRN
  Administered 2017-07-02: 50 ug via INTRAVENOUS

## 2017-07-02 MED ORDER — ONDANSETRON HCL 4 MG/2ML IJ SOLN
INTRAMUSCULAR | Status: DC | PRN
  Administered 2017-07-02: 4 mg via INTRAVENOUS

## 2017-07-02 MED ORDER — BUPIVACAINE HCL (PF) 0.25 % IJ SOLN
INTRAMUSCULAR | Status: AC
  Filled 2017-07-02: qty 30

## 2017-07-02 MED ORDER — MIDAZOLAM HCL 2 MG/2ML IJ SOLN
INTRAMUSCULAR | Status: AC
  Filled 2017-07-02: qty 2

## 2017-07-02 MED ORDER — PHENYLEPHRINE 40 MCG/ML (10ML) SYRINGE FOR IV PUSH (FOR BLOOD PRESSURE SUPPORT)
PREFILLED_SYRINGE | INTRAVENOUS | Status: AC
  Filled 2017-07-02: qty 10

## 2017-07-02 MED ORDER — OXYCODONE-ACETAMINOPHEN 7.5-325 MG PO TABS: 1.0000 | ORAL_TABLET | Freq: Four times a day (QID) | ORAL | 0 refills | Status: AC | PRN

## 2017-07-02 MED ORDER — LIDOCAINE 2% (20 MG/ML) 5 ML SYRINGE
INTRAMUSCULAR | Status: DC | PRN
  Administered 2017-07-02: 50 mg via INTRAVENOUS

## 2017-07-02 MED ORDER — CEFAZOLIN SODIUM-DEXTROSE 2-4 GM/100ML-% IV SOLN
2.0000 g | INTRAVENOUS | Status: AC
  Administered 2017-07-02: 2 g via INTRAVENOUS
  Filled 2017-07-02: qty 100

## 2017-07-02 MED ORDER — FENTANYL CITRATE (PF) 250 MCG/5ML IJ SOLN
INTRAMUSCULAR | Status: AC
  Filled 2017-07-02: qty 5

## 2017-07-02 MED ORDER — DOCUSATE SODIUM 100 MG PO CAPS: 100.0000 mg | ORAL_CAPSULE | Freq: Two times a day (BID) | ORAL | 0 refills | Status: DC

## 2017-07-02 MED ORDER — SUCCINYLCHOLINE CHLORIDE 200 MG/10ML IV SOSY
PREFILLED_SYRINGE | INTRAVENOUS | Status: AC
  Filled 2017-07-02: qty 10

## 2017-07-02 MED ORDER — PROMETHAZINE HCL 12.5 MG PO TABS: 12.5000 mg | ORAL_TABLET | Freq: Four times a day (QID) | ORAL | 0 refills | Status: DC | PRN

## 2017-07-02 MED ORDER — MIDAZOLAM HCL 2 MG/2ML IJ SOLN: 0.5000 mg | Freq: Once | INTRAMUSCULAR | Status: DC | PRN

## 2017-07-02 MED ORDER — PROPOFOL 10 MG/ML IV BOLUS
INTRAVENOUS | Status: DC | PRN
  Administered 2017-07-02: 20 mg via INTRAVENOUS
  Administered 2017-07-02: 50 mg via INTRAVENOUS

## 2017-07-02 SURGICAL SUPPLY — 44 items
BANDAGE ACE 4X5 VEL STRL LF (GAUZE/BANDAGES/DRESSINGS) ×3 IMPLANT
BANDAGE ELASTIC 4 VELCRO ST LF (GAUZE/BANDAGES/DRESSINGS) ×3 IMPLANT
BENZOIN TINCTURE PRP APPL 2/3 (GAUZE/BANDAGES/DRESSINGS) ×3 IMPLANT
BLADE 10 SAFETY STRL DISP (BLADE) ×3 IMPLANT
BLADE AVERAGE 25MMX9MM (BLADE) ×1
BLADE AVERAGE 25X9 (BLADE) ×2 IMPLANT
BLADE SURG 15 STRL LF DISP TIS (BLADE) ×2 IMPLANT
BLADE SURG 15 STRL SS (BLADE) ×4
BNDG ESMARK 4X9 LF (GAUZE/BANDAGES/DRESSINGS) ×3 IMPLANT
BNDG GAUZE ELAST 4 BULKY (GAUZE/BANDAGES/DRESSINGS) ×3 IMPLANT
CANISTER SUCT 3000ML PPV (MISCELLANEOUS) IMPLANT
CLOSURE WOUND 1/2 X4 (GAUZE/BANDAGES/DRESSINGS) ×1
CONT SPEC 4OZ CLIKSEAL STRL BL (MISCELLANEOUS) ×3 IMPLANT
COVER SURGICAL LIGHT HANDLE (MISCELLANEOUS) ×6 IMPLANT
CUFF TOURNIQUET SINGLE 18IN (TOURNIQUET CUFF) ×3 IMPLANT
DRAPE SURG 17X23 STRL (DRAPES) ×3 IMPLANT
DRSG ADAPTIC 3X8 NADH LF (GAUZE/BANDAGES/DRESSINGS) ×3 IMPLANT
DRSG PAD ABDOMINAL 8X10 ST (GAUZE/BANDAGES/DRESSINGS) ×3 IMPLANT
DURAPREP 26ML APPLICATOR (WOUND CARE) ×3 IMPLANT
ELECT NEEDLE TIP 2.8 STRL (NEEDLE) IMPLANT
ELECT REM PT RETURN 9FT ADLT (ELECTROSURGICAL)
ELECTRODE REM PT RTRN 9FT ADLT (ELECTROSURGICAL) IMPLANT
GAUZE SPONGE 4X4 12PLY STRL (GAUZE/BANDAGES/DRESSINGS) ×3 IMPLANT
GAUZE SPONGE 4X4 12PLY STRL LF (GAUZE/BANDAGES/DRESSINGS) ×3 IMPLANT
GLOVE BIO SURGEON STRL SZ 6.5 (GLOVE) ×2 IMPLANT
GLOVE BIO SURGEONS STRL SZ 6.5 (GLOVE) ×1
GLOVE BIOGEL PI IND STRL 6.5 (GLOVE) ×1 IMPLANT
GLOVE BIOGEL PI INDICATOR 6.5 (GLOVE) ×2
GOWN STRL REUS W/ TWL LRG LVL3 (GOWN DISPOSABLE) ×2 IMPLANT
GOWN STRL REUS W/TWL LRG LVL3 (GOWN DISPOSABLE) ×4
KIT BASIN OR (CUSTOM PROCEDURE TRAY) ×3 IMPLANT
KIT ROOM TURNOVER OR (KITS) ×3 IMPLANT
MATRIX TISSUE MESHED 4X5 (Tissue) ×3 IMPLANT
NEEDLE HYPO 25GX1X1/2 BEV (NEEDLE) ×3 IMPLANT
NS IRRIG 1000ML POUR BTL (IV SOLUTION) ×3 IMPLANT
PACK ORTHO EXTREMITY (CUSTOM PROCEDURE TRAY) ×3 IMPLANT
PAD ABD 8X10 STRL (GAUZE/BANDAGES/DRESSINGS) ×3 IMPLANT
PAD ARMBOARD 7.5X6 YLW CONV (MISCELLANEOUS) ×6 IMPLANT
STRIP CLOSURE SKIN 1/2X4 (GAUZE/BANDAGES/DRESSINGS) ×2 IMPLANT
SUT ETHILON 4 0 PS 2 18 (SUTURE) ×6 IMPLANT
SYR CONTROL 10ML LL (SYRINGE) ×3 IMPLANT
TOWEL OR 17X24 6PK STRL BLUE (TOWEL DISPOSABLE) ×3 IMPLANT
TOWEL OR 17X26 10 PK STRL BLUE (TOWEL DISPOSABLE) ×3 IMPLANT
YANKAUER SUCT BULB TIP NO VENT (SUCTIONS) IMPLANT

## 2017-07-02 NOTE — Op Note (Signed)
DATE: 07-02-17  SURGEON: [name], DPM  PREOPERATIVE DIAGNOSIS: Chronic Right diabetic foot ulceration with prominent tarsal bone     POSTOPERATIVE DIAGNOSIS:Same     PROCEDURE PERFORMED:Right wound debridement and removal of bone/tarsal exostectomy with placement of Integra wound graft     HEMOSTASIS: Right ankle tourniquet     ESTIMATED BLOOD LOSS: Minimal     ANESTHESIA: MAC with local 20cc of 1:1 mixture 1% lidocaine and 0.5% marcaine plain pre op and 10cc 1:1 mixture 1% lidocaine and 0.5% marcaine plain post op    SPECIMENS: Bone and soft tissue  To PATHOLOGY   MICRO: Wound cultures   COMPLICATIONS: None.     INDICATIONS FOR PROCEDURE: This patient is a pleasant 66 y.o. diabetic male who has been seen on many occassions for Right foot ulceration without resolution with conservative wound care. At this time, all risks, complications, benefits, and alternatives were explained in detail to the patient. Patient opts for Right foot wound debridement  And removal of bone (tarsal) with placement of graft. Risks and complications include but are not limited to infection, recurrence of symptoms, pain, numbness, wound dehiscence, delayed healing, as well as need for future surgery/amputation. No guarantees were given or applied. All questions were answered to the patient's satisfaction, and the patient has consented to the above procedure. All preoperative labs and H&P, medical clearances have been obtained and NPO status past midnight has been confirmed.  PREPARATION FOR PROCEDURE: The patient was brought to the operating room and placed on the operating table in supine position. A pneumatic ankletourniquet was placed about the patient's Right foot but not yet inflated. After the department of anesthesia had administered MAC anesthesia. A local block was administered and The Right foot was then scrubbed, prepped, and draped in the usual aseptic  manner. An Esmarch bandage was utilized to exsanguinate the patient's Right foot and leg, and the pneumatic tourniquet was inflated to 250 mmHg.  PROCEDURE IN DETAIL: At this time, attention was directed to the patient's Right foot where there was of note prominent bone and a full thickness ulceration plantar aspect of the foot that measures 2x2.5cm pre-debridement. Attention was directed to the dorsolateral foot where a linear incision was made over the area of bony prominence with care to protect and retract all neurovascular structures, once the 5th metatarsal bone was identified the bone was removed using sagittal saw and osteotomes and sent to pathology. Following removal, the area was flushed with saline and sutured closed using 3-0 vicryl and 4-0 nylon. Attention was then directed to the plantar wound which was thoroughly debrided sharply excising out the original ulcer bed in a ellipitical fashion  removing all nonviable tissue. The wound post debridement measures 4x6.5cm. Intraop wound cultures were taken via swab and sent to microbiology. Then Ingtegra Bilayer allograft was secured to wound bed using  3-0 vicryl. A post op block consisting of 10cc of 0.5% marcaine and 1% lidocaine plain was administered to surgical site. The Right foot was then dressed with adaptic overlying the surgical sites, 4 x 4 gauze, abd, Kerlix, and ACE. At this time, the Right pneumatic tourniquet was deflated, and a positive hyperemic response was noted to Right digits. The patient tolerated the procedure and anesthesia well. Upon transfer to the recovery room, the patient's vital signs were stable, and neurovascular status was intact.   Postoperative prescriptions and instructions were written and given to the patient who will return to the office of Dr. Cannon Kettle in 1 week for  continued care and management of this patient.  [name], DPM

## 2017-07-02 NOTE — Progress Notes (Signed)
Orthopedic Tech Progress Note Patient Details:  Aaron Mclean [birth year] [id number]  Ortho Devices Type of Ortho Device: Postop shoe/boot Ortho Device/Splint Interventions: Application   [name] 07/02/2017, 10:19 AM

## 2017-07-02 NOTE — Anesthesia Procedure Notes (Signed)
Procedure Name: MAC Date/Time: 07/02/2017 7:31 AM Performed by: Orlie Dakin Pre-anesthesia Checklist: Patient identified, Emergency Drugs available, Suction available, Patient being monitored and Timeout performed Patient Re-evaluated:Patient Re-evaluated prior to induction Oxygen Delivery Method: Simple face mask Preoxygenation: Pre-oxygenation with 100% oxygen

## 2017-07-02 NOTE — H&P (Signed)
H&P: Podiatry   BWG:YKZLD M Lasser 66 y.o. male  patient with a history of diabetic foot ulcer on right foot seen on several occassions in office for wound care; Patient has tried multiple conservative treatments and opted for Surgical management. Patient had pre-op physical and clearance for right tarsal exostectomy/removal of bone and wound graft completed. Patient met this AM in pre-op holding area; informed consent reviewed and signed. All questions answered. Right foot/surgical site marked.  This am patient denies any other pedal complaints. Denies Nausea/fever/vomiting/chills/night sweats/overnight events. Confirms NPO since midnight.   Patient Active Problem List   Diagnosis Date Noted  . Benign prostatic hyperplasia with urinary obstruction 09/20/2015  . Family history of malignant neoplasm of prostate 09/20/2015  . Excessive urination at night 09/20/2015    No current facility-administered medications on file prior to encounter.    Current Outpatient Prescriptions on File Prior to Encounter  Medication Sig Dispense Refill  . aspirin EC 81 MG tablet Take 81 mg by mouth daily.     Marland Kitchen LEVEMIR FLEXTOUCH 100 UNIT/ML Pen Inject 5-20 Units into the skin at bedtime as needed (high blood sugar).     Marland Kitchen losartan-hydrochlorothiazide (HYZAAR) 100-25 MG tablet Take 1 tablet by mouth daily.     . silver sulfADIAZINE (SILVADENE) 1 % cream Apply 1 application topically daily. To right foot ulcer 50 g 1     Allergies  Allergen Reactions  . No Known Allergies     Social History   Social History  . Marital status: Single    Spouse name: N/A  . Number of children: N/A  . Years of education: N/A   Occupational History  . Not on file.   Social History Main Topics  . Smoking status: Former Smoker    Types: Cigars  . Smokeless tobacco: Never Used  . Alcohol use No  . Drug use: No  . Sexual activity: Not on file   Other Topics Concern  . Not on file   Social History Narrative  .  No narrative on file    Past Surgical History:  Procedure Laterality Date  . CARDIOVASCULAR STRESS TEST     05/01/17 [facility]): No ischemia. Fixed defect in the inferior segment most likely from scar from old MI. Marked hypokinetic inferior segment of LV with EF 37%.   Marland Kitchen FOOT TENDON SURGERY Right    spur removed  . ROTATOR CUFF REPAIR Right     Family History  Problem Relation Age of Onset  . Alzheimer's disease Mother   . Congestive Heart Failure Mother   . Cancer Father      REVIEW OF SYSTEMS: Neurologic: Denies vertigo, syncope, convulsions or headaches. Musculoskeletal: No muscle or joint pain. Cardiorespiratory:  Denies shortness of breath, dyspnea on exertion, chest pain, cough or  hemoptysis. Gastrointestinal:Admits GERD symptoms, Denies loose stool, Denies emesis, melena, constipationor rectal  bleeding. Genitourinary: No difficulty with voiding noted.  PHYSICAL EXAMINATION:  Today's Vitals   06/26/17 1713 07/02/17 0636  BP:  (!) 176/76  Pulse:  65  Resp:  20  Temp:  98 F (36.7 C)  TempSrc:  Oral  SpO2:  100%  Weight: 206 lb (93.4 kg)   Height: _0  (1.905 m)     GENERAL: Well-developed, well-nourished, in no acute distress. Alert and cooperative.  LOWER EXTREMITY EXAM: DERMATOLOGY: Dressing intact to right foot NEUROVASCULAR: Unchanged from prior MUSCULOSKELETAL: +foot deformity w/ chronic ulcer to area of concern  XRAY, RIGHT FOOT:Significant midfoot and ankle arthritic changes  and sclerotic bone changes, amputation status, No obvious bony destruction suggestive of acute osteomyelitis. No gas in soft tissues.   ASSESSMENTS:  1.Diabetic foot ulcer, Right 2. Prominent bone, Right foot  3. Diabetic peripheral neuropathy  PLAN OF CARE: Patient seen and evaluated 1. History and physical completed 2. Patient NPO since midnight 3. Previous Imaging reviewed  4. Consent for surgery explained and obtained for Right removal  of bone/tarsal exostectomy with debridement and placement of wound graft; risk and benefits explained; all questions answered and no guarantees granted. 5. Patient to undergo above surgical procedure 6. Case discussed with patient. Patient reports that brother, Louie Casa brought him today 7. To resume all home meds post-procedure and to give prn pain meds, anti-nausea, and anti-constipation medications post op 8. Will continue to follow closely/ see post-operative in the office within 1 week.  [name], DPM

## 2017-07-02 NOTE — Telephone Encounter (Signed)
He is supposed to be using his crutches and no weight on the foot since he has a graft in place -Dr. Chauncey Cruel

## 2017-07-02 NOTE — Discharge Instructions (Signed)
Attached to Chart

## 2017-07-02 NOTE — Transfer of Care (Signed)
Immediate Anesthesia Transfer of Care Note  Patient: Aaron Mclean  Procedure(s) Performed: Procedure(s): TORSAL EXOSTECTOMY FIFTH METARSAL BASE WOUND DEBRIDMENT AND GRAFT APPLICATION (Right) DEBRIDEMENT WOUND (Right) GRAFT APPLICATION (Right)  Patient Location: PACU  Anesthesia Type:MAC  Level of Consciousness: awake, alert  and oriented  Airway & Oxygen Therapy: Patient Spontanous Breathing and Patient connected to face mask oxygen  Post-op Assessment: Report given to RN and Post -op Vital signs reviewed and stable  Post vital signs: Reviewed and stable  Last Vitals:  Vitals:   07/02/17 0636  BP: (!) 176/76  Pulse: 65  Resp: 20  Temp: 36.7 C    Last Pain:  Vitals:   07/02/17 0636  TempSrc: Oral      Patients Stated Pain Goal: 5 (33/83/29 1916)  Complications: No apparent anesthesia complications

## 2017-07-02 NOTE — Telephone Encounter (Signed)
Patient called stating that he got home just a few hours ago and he noticed that it was bleeding through he didn't think anything of it and went to take a nap when he woke up he went to the bathroom and noticed that he was tracking blood across the floor.  When I asked the size of the area that bled through he said the entire foot was covered he had wrapped 2 wash clothes around it and it seemed to have slowed.  I scheduled the patient to come in tomorrow so that we can remove some of the bandage and add new dry padding.  Told the patient that if it continues to bleed that he will need to go to the ER so that they can be sure it stops. Patient stated understanding.

## 2017-07-02 NOTE — Anesthesia Postprocedure Evaluation (Signed)
Anesthesia Post Note  Patient: Aaron Mclean  Procedure(s) Performed: Procedure(s) (LRB): TORSAL EXOSTECTOMY FIFTH METARSAL BASE WOUND DEBRIDMENT AND GRAFT APPLICATION (Right) DEBRIDEMENT WOUND (Right) GRAFT APPLICATION (Right)     Patient location during evaluation: PACU Anesthesia Type: MAC Level of consciousness: awake and alert, patient cooperative and oriented Pain management: pain level controlled Vital Signs Assessment: post-procedure vital signs reviewed and stable Respiratory status: spontaneous breathing, nonlabored ventilation and respiratory function stable Cardiovascular status: blood pressure returned to baseline and stable Postop Assessment: no signs of nausea or vomiting Anesthetic complications: no    Last Vitals:  Vitals:   07/02/17 0925 07/02/17 0940  BP: (!) 159/65 (!) 152/75  Pulse: 63 62  Resp: 11 12  Temp:  36.6 C    Last Pain:  Vitals:   07/02/17 0636  TempSrc: Oral                 Ariyan Sinnett,E. Arvell Pulsifer

## 2017-07-02 NOTE — Brief Op Note (Signed)
07/02/2017  8:50 AM  PATIENT:  Aaron Mclean  66 y.o. male  PRE-OPERATIVE DIAGNOSIS:  DIABETIC ULCER RIGHT FOOT  POST-OPERATIVE DIAGNOSIS:  DIABETIC ULCER RIGHT FOOT  PROCEDURE:  Procedure(s): TORSAL EXOSTECTOMY FIFTH METARSAL BASE WOUND DEBRIDMENT AND GRAFT APPLICATION (Right) DEBRIDEMENT WOUND (Right) GRAFT APPLICATION (Right)  SURGEON:  Surgeon(s) and Role:    [name], DPM - Primary  PHYSICIAN ASSISTANT:   ASSISTANTS: none   ANESTHESIA:   MAC with Local   EBL:  No intake/output data recorded.  BLOOD ADMINISTERED:none  DRAINS: none   LOCAL MEDICATIONS USED:  LIDOCAINE AND MARCAINE     SPECIMEN:  Source of Specimen:  Right foot bone and soft tissue  Wound culture  DISPOSITION OF SPECIMEN:  PATHOLOGY  Microbiology   COUNTS:  YES  TOURNIQUET:  * Missing tourniquet times found for documented tourniquets in log:  [id number] *  DICTATION: .Note written in EPIC  PLAN OF CARE: Discharge to home after PACU  PATIENT DISPOSITION:  PACU - hemodynamically stable.   Delay start of Pharmacological VTE agent (>24hrs) due to surgical blood loss or risk of bleeding: no

## 2017-07-02 NOTE — H&P (Signed)
Anesthesia H&P Update: History and Physical Exam reviewed; patient is OK for planned anesthetic and procedure. ? ?

## 2017-07-02 NOTE — Telephone Encounter (Signed)
Dr. Cannon Kettle performed surgery on me toady. I got home about an hour ago and my foot started bleeding through the bandage. I don't know what happened, but I wrapped a towel around it. I'm waiting to hear back on what to do.

## 2017-07-03 ENCOUNTER — Ambulatory Visit (INDEPENDENT_AMBULATORY_CARE_PROVIDER_SITE_OTHER): Payer: PPO | Admitting: Sports Medicine

## 2017-07-03 ENCOUNTER — Telehealth: Payer: Self-pay | Admitting: Sports Medicine

## 2017-07-03 ENCOUNTER — Encounter (HOSPITAL_COMMUNITY): Payer: Self-pay | Admitting: Sports Medicine

## 2017-07-03 DIAGNOSIS — Z9889 Other specified postprocedural states: Secondary | ICD-10-CM

## 2017-07-03 DIAGNOSIS — E1142 Type 2 diabetes mellitus with diabetic polyneuropathy: Secondary | ICD-10-CM

## 2017-07-03 DIAGNOSIS — M79671 Pain in right foot: Secondary | ICD-10-CM

## 2017-07-03 DIAGNOSIS — M898X9 Other specified disorders of bone, unspecified site: Secondary | ICD-10-CM

## 2017-07-03 DIAGNOSIS — E13621 Other specified diabetes mellitus with foot ulcer: Secondary | ICD-10-CM

## 2017-07-03 DIAGNOSIS — L97512 Non-pressure chronic ulcer of other part of right foot with fat layer exposed: Secondary | ICD-10-CM

## 2017-07-03 NOTE — Progress Notes (Signed)
Subjective: Aaron Mclean is a 66 y.o. male patient seen today in office for POV #1 (DOS 07-02-17), S/P Right removal of bone/exostectomy with wound debridement with placement of Integra graft. Patient denies pain at surgical site, denies calf pain, denies headache, chest pain, shortness of breath, nausea, vomiting, fever, or chills. Patient states that he experienced a lot of bleeding and is here today for bandage change.   Patient Active Problem List   Diagnosis Date Noted  . Benign prostatic hyperplasia with urinary obstruction 09/20/2015  . Family history of malignant neoplasm of prostate 09/20/2015  . Excessive urination at night 09/20/2015    Current Outpatient Prescriptions on File Prior to Visit  Medication Sig Dispense Refill  . acetaminophen (TYLENOL) 325 MG tablet Take 650 mg by mouth 3 (three) times daily as needed for moderate pain or headache.    Marland Kitchen aspirin EC 81 MG tablet Take 81 mg by mouth daily.     Marland Kitchen docusate sodium (COLACE) 100 MG capsule Take 1 capsule (100 mg total) by mouth 2 (two) times daily. 10 capsule 0  . doxycycline (VIBRA-TABS) 100 MG tablet Take 100 mg by mouth 2 (two) times daily.    Marland Kitchen gabapentin (NEURONTIN) 300 MG capsule Take 300 mg by mouth 3 (three) times daily as needed (neuropathy).    Marland Kitchen ibuprofen (ADVIL,MOTRIN) 200 MG tablet Take 400 mg by mouth every 6 (six) hours as needed for headache or moderate pain.    Marland Kitchen LEVEMIR FLEXTOUCH 100 UNIT/ML Pen Inject 5-20 Units into the skin at bedtime as needed (high blood sugar).     Marland Kitchen losartan-hydrochlorothiazide (HYZAAR) 100-25 MG tablet Take 1 tablet by mouth daily.     . metroNIDAZOLE (FLAGYL) 500 MG tablet Take 500 mg by mouth 3 (three) times daily.    . Multiple Vitamin (MULTIVITAMIN WITH MINERALS) TABS tablet Take 1 tablet by mouth 2 (two) times a week.    . nebivolol (BYSTOLIC) 10 MG tablet Take 10 mg by mouth daily.    Marland Kitchen oxyCODONE-acetaminophen (PERCOCET) 7.5-325 MG tablet Take 1 tablet by mouth every 6 (six)  hours as needed for severe pain. 28 tablet 0  . promethazine (PHENERGAN) 12.5 MG tablet Take 1 tablet (12.5 mg total) by mouth every 6 (six) hours as needed for nausea or vomiting. 30 tablet 0  . spironolactone (ALDACTONE) 25 MG tablet Take 25 mg by mouth daily.     No current facility-administered medications on file prior to visit.     Allergies  Allergen Reactions  . No Known Allergies     Objective: There were no vitals filed for this visit.  General: No acute distress, AAOx3  Right foot: Sutures intact and graft in place with bloody drainage, no hematoma, no erythema, no warmth, no drainage, no signs of infection noted, Capillary fill time <3 seconds in all digits, gross sensation present via light touch to right foot. No pain with calf compression.   Assessment and Plan:  Problem List Items Addressed This Visit    None    Visit Diagnoses    S/P foot surgery, right    -  Primary   Diabetic ulcer of other part of right foot associated with diabetes mellitus of other type, with fat layer exposed ([place])       s/p debridement with Integra graft in place   Exostosis       S/p exostectomy    Diabetic polyneuropathy associated with type 2 diabetes mellitus ([place])       Right  foot pain           -Patient seen and evaluated -Applied dry sterile dressing to surgical site right foot secured with ACE wrap and stockinet  -Advised patient to make sure to keep dressings clean, dry, and intact to right surgical site -Advised patient to continue with post-op shoe on right foot and NWB with crutches; instructed patient on proper use and ordered knee scooter for pain to use to help keep pressure off healing wound/graft -Advised patient to limit activity to necessity  -Advised patient to ice and elevate as necessary  -Continue with PO antibiotics until completed -Pending intra op cultures and pathology -Will plan for graft check at next office visit. In the meantime, patient to call office if  any issues or problems arise.   [name], DPM

## 2017-07-03 NOTE — Telephone Encounter (Signed)
Post op check Patient reports that started to bleed through bandage on yesterday. Patient states that he did not put any weight on the foot and has wrapped his dressing in a towel and is coming in today for a dressing change. Patient denies any other issues or symptoms at this time. -Dr. Cannon Kettle

## 2017-07-07 LAB — AEROBIC/ANAEROBIC CULTURE (SURGICAL/DEEP WOUND)

## 2017-07-07 LAB — AEROBIC/ANAEROBIC CULTURE W GRAM STAIN (SURGICAL/DEEP WOUND)
Culture: NORMAL
Gram Stain: NONE SEEN

## 2017-07-11 ENCOUNTER — Telehealth: Payer: Self-pay | Admitting: *Deleted

## 2017-07-11 ENCOUNTER — Ambulatory Visit (INDEPENDENT_AMBULATORY_CARE_PROVIDER_SITE_OTHER): Payer: PPO | Admitting: Sports Medicine

## 2017-07-11 DIAGNOSIS — M898X9 Other specified disorders of bone, unspecified site: Secondary | ICD-10-CM

## 2017-07-11 DIAGNOSIS — L97512 Non-pressure chronic ulcer of other part of right foot with fat layer exposed: Secondary | ICD-10-CM

## 2017-07-11 DIAGNOSIS — E1142 Type 2 diabetes mellitus with diabetic polyneuropathy: Secondary | ICD-10-CM

## 2017-07-11 DIAGNOSIS — M79671 Pain in right foot: Secondary | ICD-10-CM

## 2017-07-11 DIAGNOSIS — Z9889 Other specified postprocedural states: Secondary | ICD-10-CM

## 2017-07-11 DIAGNOSIS — E13621 Other specified diabetes mellitus with foot ulcer: Secondary | ICD-10-CM

## 2017-07-11 NOTE — Telephone Encounter (Addendum)
-----   Message from [place], [place] sent at 07/11/2017  4:14 PM EDT ----- Regarding: [facility] right foot adaptic and dry dressing 2x per week at area of wound graft. Make sure patient is keeping pressure off the foot; patient is having a hard time so I advised heel weightbearing only to help keep pressure off graft/wound site -Dr. Cannon Kettle. Orders faxed to [facility].

## 2017-07-11 NOTE — Progress Notes (Signed)
Subjective: Aaron Mclean is a 66 y.o. male patient seen today in office for POV #2 (DOS 07-02-17), S/P Right removal of bone/exostectomy with wound debridement with placement of Integra graft. Patient denies pain at surgical site, states that he did have an episode on Saturday of pain in the but improved with medication. Patient denies denies calf pain, denies headache, chest pain, shortness of breath, nausea, vomiting, fever, or chills.   Patient Active Problem List   Diagnosis Date Noted  . Benign prostatic hyperplasia with urinary obstruction 09/20/2015  . Family history of malignant neoplasm of prostate 09/20/2015  . Excessive urination at night 09/20/2015    Current Outpatient Prescriptions on File Prior to Visit  Medication Sig Dispense Refill  . acetaminophen (TYLENOL) 325 MG tablet Take 650 mg by mouth 3 (three) times daily as needed for moderate pain or headache.    Marland Kitchen aspirin EC 81 MG tablet Take 81 mg by mouth daily.     Marland Kitchen docusate sodium (COLACE) 100 MG capsule Take 1 capsule (100 mg total) by mouth 2 (two) times daily. 10 capsule 0  . doxycycline (VIBRA-TABS) 100 MG tablet Take 100 mg by mouth 2 (two) times daily.    Marland Kitchen gabapentin (NEURONTIN) 300 MG capsule Take 300 mg by mouth 3 (three) times daily as needed (neuropathy).    Marland Kitchen ibuprofen (ADVIL,MOTRIN) 200 MG tablet Take 400 mg by mouth every 6 (six) hours as needed for headache or moderate pain.    Marland Kitchen LEVEMIR FLEXTOUCH 100 UNIT/ML Pen Inject 5-20 Units into the skin at bedtime as needed (high blood sugar).     Marland Kitchen losartan-hydrochlorothiazide (HYZAAR) 100-25 MG tablet Take 1 tablet by mouth daily.     . metroNIDAZOLE (FLAGYL) 500 MG tablet Take 500 mg by mouth 3 (three) times daily.    . Multiple Vitamin (MULTIVITAMIN WITH MINERALS) TABS tablet Take 1 tablet by mouth 2 (two) times a week.    . nebivolol (BYSTOLIC) 10 MG tablet Take 10 mg by mouth daily.    . promethazine (PHENERGAN) 12.5 MG tablet Take 1 tablet (12.5 mg total) by mouth  every 6 (six) hours as needed for nausea or vomiting. 30 tablet 0  . spironolactone (ALDACTONE) 25 MG tablet Take 25 mg by mouth daily.     No current facility-administered medications on file prior to visit.     Allergies  Allergen Reactions  . No Known Allergies     Objective: There were no vitals filed for this visit.  General: No acute distress, AAOx3  Right foot: Sutures intact and graft in place with bloody drainage at perimeter of graft, no hematoma, no erythema, no warmth, no drainage, no signs of infection noted, Capillary fill time <3 seconds in all digits, gross sensation present via light touch to right foot. No pain with calf compression.   Assessment and Plan:  Problem List Items Addressed This Visit    None    Visit Diagnoses    S/P foot surgery, right    -  Primary   Diabetic ulcer of other part of right foot associated with diabetes mellitus of other type, with fat layer exposed ([place])       Exostosis       Diabetic polyneuropathy associated with type 2 diabetes mellitus ([place])       Right foot pain           -Patient seen and evaluated -Applied dry sterile dressing to surgical site right foot secured with ACE wrap and stockinet  -  Advised patient to make sure to keep dressings clean, dry, and intact to right surgical site -Advised patient to continue with post-op shoe on right foot and NWB with crutches to help keep pressure off healing wound/graft, However patient reports that he has difficulty with using crutches so advised patient to only weight-bear to heel.  -[facility] ordered for local wound care and for PT/home safety to make sure Patient is staying off the foot as much as possible -Advised patient to limit activity to necessity  -Advised patient to ice and elevate as necessary  -Continue with PO antibiotics until completed -Intraoperative wound cultures are negative. -Will plan for graft check at next office visit. In the meantime, patient to  call office if any issues or problems arise.   [name], DPM

## 2017-07-18 ENCOUNTER — Ambulatory Visit (INDEPENDENT_AMBULATORY_CARE_PROVIDER_SITE_OTHER): Payer: PPO | Admitting: Sports Medicine

## 2017-07-18 DIAGNOSIS — E13621 Other specified diabetes mellitus with foot ulcer: Secondary | ICD-10-CM

## 2017-07-18 DIAGNOSIS — M79671 Pain in right foot: Secondary | ICD-10-CM

## 2017-07-18 DIAGNOSIS — L97512 Non-pressure chronic ulcer of other part of right foot with fat layer exposed: Secondary | ICD-10-CM

## 2017-07-18 DIAGNOSIS — M898X9 Other specified disorders of bone, unspecified site: Secondary | ICD-10-CM

## 2017-07-18 DIAGNOSIS — E1142 Type 2 diabetes mellitus with diabetic polyneuropathy: Secondary | ICD-10-CM

## 2017-07-18 DIAGNOSIS — Z9889 Other specified postprocedural states: Secondary | ICD-10-CM

## 2017-07-18 NOTE — Progress Notes (Signed)
Subjective: Aaron Mclean is a 66 y.o. male patient seen today in office for POV #3 (DOS 07-02-17), S/P Right removal of bone/exostectomy with wound debridement with placement of Integra graft. Patient denies pain at surgical site, states that he did have a raw feeling where the dressing may have been rubbing. However, Kept it intact since last visit on last week. Patient denies denies calf pain, denies headache, chest pain, shortness of breath, nausea, vomiting, fever, or chills.   Patient Active Problem List   Diagnosis Date Noted  . Benign prostatic hyperplasia with urinary obstruction 09/20/2015  . Family history of malignant neoplasm of prostate 09/20/2015  . Excessive urination at night 09/20/2015    Current Outpatient Prescriptions on File Prior to Visit  Medication Sig Dispense Refill  . acetaminophen (TYLENOL) 325 MG tablet Take 650 mg by mouth 3 (three) times daily as needed for moderate pain or headache.    Marland Kitchen aspirin EC 81 MG tablet Take 81 mg by mouth daily.     Marland Kitchen docusate sodium (COLACE) 100 MG capsule Take 1 capsule (100 mg total) by mouth 2 (two) times daily. 10 capsule 0  . doxycycline (VIBRA-TABS) 100 MG tablet Take 100 mg by mouth 2 (two) times daily.    Marland Kitchen gabapentin (NEURONTIN) 300 MG capsule Take 300 mg by mouth 3 (three) times daily as needed (neuropathy).    Marland Kitchen ibuprofen (ADVIL,MOTRIN) 200 MG tablet Take 400 mg by mouth every 6 (six) hours as needed for headache or moderate pain.    Marland Kitchen LEVEMIR FLEXTOUCH 100 UNIT/ML Pen Inject 5-20 Units into the skin at bedtime as needed (high blood sugar).     Marland Kitchen losartan-hydrochlorothiazide (HYZAAR) 100-25 MG tablet Take 1 tablet by mouth daily.     . metroNIDAZOLE (FLAGYL) 500 MG tablet Take 500 mg by mouth 3 (three) times daily.    . Multiple Vitamin (MULTIVITAMIN WITH MINERALS) TABS tablet Take 1 tablet by mouth 2 (two) times a week.    . nebivolol (BYSTOLIC) 10 MG tablet Take 10 mg by mouth daily.    . promethazine (PHENERGAN) 12.5 MG  tablet Take 1 tablet (12.5 mg total) by mouth every 6 (six) hours as needed for nausea or vomiting. 30 tablet 0  . spironolactone (ALDACTONE) 25 MG tablet Take 25 mg by mouth daily.     No current facility-administered medications on file prior to visit.     Allergies  Allergen Reactions  . No Known Allergies     Objective: There were no vitals filed for this visit.  General: No acute distress, AAOx3  Right foot: Sutures intactAt dorsal lateral aspect of right foot and graft in place at the plantar aspect of the right foot with bloody drainage at perimeter of graft, Graft appears to be incorporating, no hematoma, no erythema, no warmth, no drainage, no signs of infection noted, Capillary fill time <3 seconds in all digits, gross sensation present via light touch to right foot. No pain with calf compression.   Assessment and Plan:  Problem List Items Addressed This Visit    None    Visit Diagnoses    S/P foot surgery, right    -  Primary   Diabetic ulcer of other part of right foot associated with diabetes mellitus of other type, with fat layer exposed ([place])       Exostosis       Diabetic polyneuropathy associated with type 2 diabetes mellitus ([place])       Right foot pain          -  Patient seen and evaluated -Applied , Adaptic and dry sterile dressing to surgical site right foot secured with ACE wrap and stockinet  -Advised patient to make sure to keep dressings clean, dry, and intact to right surgical site -Advised patient to continue with post-op shoe on right foot and weightbearing to heel only -[facility] re-ordered for local wound care and for PT/home safety to make sure Patient is staying off the foot as much as possible; case discussed with Dorian Heckle at [phone]  -Advised patient to limit activity to necessity  -Advised patient to ice and elevate as necessary  -Will plan for dorsal suture removal and  graft check at next office visit. In the meantime,  patient to call office if any issues or problems arise.   [name], DPM

## 2017-07-19 NOTE — Telephone Encounter (Addendum)
-----   Message from [name], [place] sent at 07/18/2017 12:11 PM EDT ----- Regarding: [facility] Nelma Rothman from [facility] said she did not get an order for Mr. Hunger for his home nursing. Her # is [phone] and fax is [phone]. 07/19/2017-I spoke with Estill Bamberg - [facility] and explained to orders were faxed 07/11/2017 after 5:00pm to the main office of [facility]. Estill Bamberg states they have not received. I refaxed to [facility] in [place] as requested.

## 2017-07-19 NOTE — Telephone Encounter (Signed)
Thank you -Dr. Cannon Kettle

## 2017-07-20 DIAGNOSIS — L97416 Non-pressure chronic ulcer of right heel and midfoot with bone involvement without evidence of necrosis: Secondary | ICD-10-CM | POA: Diagnosis not present

## 2017-07-20 DIAGNOSIS — Z7982 Long term (current) use of aspirin: Secondary | ICD-10-CM | POA: Diagnosis not present

## 2017-07-20 DIAGNOSIS — E11621 Type 2 diabetes mellitus with foot ulcer: Secondary | ICD-10-CM | POA: Diagnosis not present

## 2017-07-20 DIAGNOSIS — N401 Enlarged prostate with lower urinary tract symptoms: Secondary | ICD-10-CM | POA: Diagnosis not present

## 2017-07-20 DIAGNOSIS — T8130XA Disruption of wound, unspecified, initial encounter: Secondary | ICD-10-CM | POA: Diagnosis not present

## 2017-07-20 DIAGNOSIS — Z794 Long term (current) use of insulin: Secondary | ICD-10-CM | POA: Diagnosis not present

## 2017-07-20 DIAGNOSIS — R278 Other lack of coordination: Secondary | ICD-10-CM | POA: Diagnosis not present

## 2017-07-24 DIAGNOSIS — E785 Hyperlipidemia, unspecified: Secondary | ICD-10-CM | POA: Diagnosis not present

## 2017-07-24 DIAGNOSIS — E119 Type 2 diabetes mellitus without complications: Secondary | ICD-10-CM | POA: Diagnosis not present

## 2017-07-24 DIAGNOSIS — E1159 Type 2 diabetes mellitus with other circulatory complications: Secondary | ICD-10-CM | POA: Diagnosis not present

## 2017-07-25 ENCOUNTER — Ambulatory Visit (INDEPENDENT_AMBULATORY_CARE_PROVIDER_SITE_OTHER): Payer: Self-pay | Admitting: Sports Medicine

## 2017-07-25 DIAGNOSIS — E1142 Type 2 diabetes mellitus with diabetic polyneuropathy: Secondary | ICD-10-CM

## 2017-07-25 DIAGNOSIS — E13621 Other specified diabetes mellitus with foot ulcer: Secondary | ICD-10-CM

## 2017-07-25 DIAGNOSIS — Z9889 Other specified postprocedural states: Secondary | ICD-10-CM

## 2017-07-25 DIAGNOSIS — L97512 Non-pressure chronic ulcer of other part of right foot with fat layer exposed: Secondary | ICD-10-CM

## 2017-07-25 DIAGNOSIS — M79671 Pain in right foot: Secondary | ICD-10-CM

## 2017-07-25 DIAGNOSIS — M898X9 Other specified disorders of bone, unspecified site: Secondary | ICD-10-CM

## 2017-07-25 NOTE — Progress Notes (Signed)
Subjective: Aaron Mclean is a 66 y.o. male patient seen today in office for POV #4 (DOS 07-02-17), S/P Right removal of bone/exostectomy with wound debridement with placement of Integra graft. Patient denies pain at surgical site, states that he had home nursing visit him twice past week. Patient denies denies calf pain, denies headache, chest pain, shortness of breath, nausea, vomiting, fever, or chills.   Patient Active Problem List   Diagnosis Date Noted  . Benign prostatic hyperplasia with urinary obstruction 09/20/2015  . Family history of malignant neoplasm of prostate 09/20/2015  . Excessive urination at night 09/20/2015    Current Outpatient Prescriptions on File Prior to Visit  Medication Sig Dispense Refill  . acetaminophen (TYLENOL) 325 MG tablet Take 650 mg by mouth 3 (three) times daily as needed for moderate pain or headache.    Marland Kitchen aspirin EC 81 MG tablet Take 81 mg by mouth daily.     Marland Kitchen docusate sodium (COLACE) 100 MG capsule Take 1 capsule (100 mg total) by mouth 2 (two) times daily. 10 capsule 0  . doxycycline (VIBRA-TABS) 100 MG tablet Take 100 mg by mouth 2 (two) times daily.    Marland Kitchen gabapentin (NEURONTIN) 300 MG capsule Take 300 mg by mouth 3 (three) times daily as needed (neuropathy).    Marland Kitchen ibuprofen (ADVIL,MOTRIN) 200 MG tablet Take 400 mg by mouth every 6 (six) hours as needed for headache or moderate pain.    Marland Kitchen LEVEMIR FLEXTOUCH 100 UNIT/ML Pen Inject 5-20 Units into the skin at bedtime as needed (high blood sugar).     Marland Kitchen losartan-hydrochlorothiazide (HYZAAR) 100-25 MG tablet Take 1 tablet by mouth daily.     . metroNIDAZOLE (FLAGYL) 500 MG tablet Take 500 mg by mouth 3 (three) times daily.    . Multiple Vitamin (MULTIVITAMIN WITH MINERALS) TABS tablet Take 1 tablet by mouth 2 (two) times a week.    . nebivolol (BYSTOLIC) 10 MG tablet Take 10 mg by mouth daily.    . promethazine (PHENERGAN) 12.5 MG tablet Take 1 tablet (12.5 mg total) by mouth every 6 (six) hours as needed  for nausea or vomiting. 30 tablet 0  . spironolactone (ALDACTONE) 25 MG tablet Take 25 mg by mouth daily.     No current facility-administered medications on file prior to visit.     Allergies  Allergen Reactions  . No Known Allergies     Objective: There were no vitals filed for this visit.  General: No acute distress, AAOx3  Right foot: Sutures intact at dorsal lateral aspect of right foot and graft in place at the plantar aspect of the right foot with bloody drainage at perimeter of graft, Graft appears to be incorporating with silicone layer slowly lifting, no hematoma, no erythema, no warmth, no drainage, no signs of infection noted, Capillary fill time <3 seconds in all digits, gross sensation present via light touch to right foot. No pain with calf compression.   Assessment and Plan:  Problem List Items Addressed This Visit    None    Visit Diagnoses    S/P foot surgery, right    -  Primary   Diabetic ulcer of other part of right foot associated with diabetes mellitus of other type, with fat layer exposed ()       Exostosis       Diabetic polyneuropathy associated with type 2 diabetes mellitus ([place])       Right foot pain          -Patient  seen and evaluated -Sutures at dorsal lateral foot, removed -Applied Steri-Strips and Adaptic and dry sterile dressing to surgical site right foot secured with ACE wrap and stockinet  -Advised patient to make sure to keep dressings clean, dry, and intact to right surgical site -Advised patient to continue with post-op shoe on right foot and weightbearing to heel only -[facility] to continue with local wound care consisting of Adaptic and dry dressing to site and for /home safety to make sure Patient is staying off the foot as much as possible -Advised patient to limit activity to necessity  -Advised patient to ice and elevate as necessary  -Will plan for graft check/removal of silicone layer and placing [place] at next office  visit. In the meantime, patient to call office if any issues or problems arise.   [name], DPM

## 2017-08-02 ENCOUNTER — Ambulatory Visit (INDEPENDENT_AMBULATORY_CARE_PROVIDER_SITE_OTHER): Payer: PPO | Admitting: Sports Medicine

## 2017-08-02 ENCOUNTER — Encounter (INDEPENDENT_AMBULATORY_CARE_PROVIDER_SITE_OTHER): Payer: Self-pay

## 2017-08-02 DIAGNOSIS — L97511 Non-pressure chronic ulcer of other part of right foot limited to breakdown of skin: Secondary | ICD-10-CM

## 2017-08-02 DIAGNOSIS — Z9889 Other specified postprocedural states: Secondary | ICD-10-CM

## 2017-08-02 DIAGNOSIS — E1142 Type 2 diabetes mellitus with diabetic polyneuropathy: Secondary | ICD-10-CM

## 2017-08-02 DIAGNOSIS — E08621 Diabetes mellitus due to underlying condition with foot ulcer: Secondary | ICD-10-CM

## 2017-08-02 DIAGNOSIS — M79671 Pain in right foot: Secondary | ICD-10-CM

## 2017-08-03 NOTE — Progress Notes (Signed)
Subjective: JAQUIN COY is a 66 y.o. male patient seen today in office for POV #5 (DOS 07-02-17), S/P Right removal of bone/exostectomy with wound debridement with placement of Integra graft. Patient denies current pain at surgical site, states that sometimes he does feel like there is a raw sensation over the right foot at nighttime. Reports that he had home nursing visit him once this past week. Patient denies denies calf pain, denies headache, chest pain, shortness of breath, nausea, vomiting, fever, or chills.   Patient Active Problem List   Diagnosis Date Noted  . Benign prostatic hyperplasia with urinary obstruction 09/20/2015  . Family history of malignant neoplasm of prostate 09/20/2015  . Excessive urination at night 09/20/2015    Current Outpatient Prescriptions on File Prior to Visit  Medication Sig Dispense Refill  . acetaminophen (TYLENOL) 325 MG tablet Take 650 mg by mouth 3 (three) times daily as needed for moderate pain or headache.    Marland Kitchen aspirin EC 81 MG tablet Take 81 mg by mouth daily.     Marland Kitchen docusate sodium (COLACE) 100 MG capsule Take 1 capsule (100 mg total) by mouth 2 (two) times daily. 10 capsule 0  . doxycycline (VIBRA-TABS) 100 MG tablet Take 100 mg by mouth 2 (two) times daily.    Marland Kitchen gabapentin (NEURONTIN) 300 MG capsule Take 300 mg by mouth 3 (three) times daily as needed (neuropathy).    Marland Kitchen ibuprofen (ADVIL,MOTRIN) 200 MG tablet Take 400 mg by mouth every 6 (six) hours as needed for headache or moderate pain.    Marland Kitchen LEVEMIR FLEXTOUCH 100 UNIT/ML Pen Inject 5-20 Units into the skin at bedtime as needed (high blood sugar).     Marland Kitchen losartan-hydrochlorothiazide (HYZAAR) 100-25 MG tablet Take 1 tablet by mouth daily.     . metroNIDAZOLE (FLAGYL) 500 MG tablet Take 500 mg by mouth 3 (three) times daily.    . Multiple Vitamin (MULTIVITAMIN WITH MINERALS) TABS tablet Take 1 tablet by mouth 2 (two) times a week.    . nebivolol (BYSTOLIC) 10 MG tablet Take 10 mg by mouth daily.    .  promethazine (PHENERGAN) 12.5 MG tablet Take 1 tablet (12.5 mg total) by mouth every 6 (six) hours as needed for nausea or vomiting. 30 tablet 0  . spironolactone (ALDACTONE) 25 MG tablet Take 25 mg by mouth daily.     No current facility-administered medications on file prior to visit.     Allergies  Allergen Reactions  . No Known Allergies     Objective: There were no vitals filed for this visit.  General: No acute distress, AAOx3  Right foot: Sutures have absorbed at the area of the graft with the top silicone layer lifting at the plantar aspect of the right foot with bloody drainage at perimeter of graft, Graft appears incorporated. Once removal of the silicone layer. The wound measures 4 x 1 cm with granular wound bed with no surrounding maceration, no erythema, no warmth, no drainage, no other acute signs of infection noted, Capillary fill time <3 seconds in all remaining digits, gross sensation present via light touch to right foot. No pain with calf compression.   Assessment and Plan:  Problem List Items Addressed This Visit    None    Visit Diagnoses    S/P foot surgery, right    -  Primary   Diabetic ulcer of other part of right foot associated with diabetes mellitus due to underlying condition, limited to breakdown of skin ([place])  Diabetic polyneuropathy associated with type 2 diabetes mellitus ([facility])       Right foot pain          -Patient seen and evaluated -Removed silicone layer of Integra graft at right plantar lateral foot and none excisionally debrided using a moist saline gauze to healthy bleeding to the wound base at the right lateral foot -Applied PRISMA dry sterile dressing to surgical site right foot secured with ACE wrap and stockinet  -Advised patient to make sure to keep dressings clean, dry, and intact to right surgical site -Advised patient to continue with post-op shoe on right foot and weightbearing to heel only -[facility] to continue  with local wound care consisting of Prisma and dry dressing to site and for /home safety to make sure Patient is staying off the foot as much as possible; updated wound care orders sent -Advised patient to limit activity to necessity  -Advised patient to ice and elevate as necessary  -Will plan for continued wound care at next office visit. In the meantime, patient to call office if any issues or problems arise.   [name], DPM

## 2017-08-03 NOTE — Telephone Encounter (Addendum)
-----   Message from [place], [place] sent at 08/02/2017  9:57 PM EDT ----- Regarding: Wound care orders: [facility] Please let nurse know to apply Prisma or similar collagen alginate dressing to right foot ulceration covered with dry dressing and ace wrap. Thanks Dr. Chauncey Cruel. 08/03/2017-Orders faxed to [facility] [place].

## 2017-08-09 ENCOUNTER — Encounter (INDEPENDENT_AMBULATORY_CARE_PROVIDER_SITE_OTHER): Payer: Self-pay

## 2017-08-09 ENCOUNTER — Ambulatory Visit (INDEPENDENT_AMBULATORY_CARE_PROVIDER_SITE_OTHER): Payer: PPO | Admitting: Sports Medicine

## 2017-08-09 DIAGNOSIS — Z9889 Other specified postprocedural states: Secondary | ICD-10-CM

## 2017-08-09 DIAGNOSIS — E08621 Diabetes mellitus due to underlying condition with foot ulcer: Secondary | ICD-10-CM

## 2017-08-09 DIAGNOSIS — L97511 Non-pressure chronic ulcer of other part of right foot limited to breakdown of skin: Secondary | ICD-10-CM

## 2017-08-09 DIAGNOSIS — E1142 Type 2 diabetes mellitus with diabetic polyneuropathy: Secondary | ICD-10-CM

## 2017-08-09 DIAGNOSIS — M79671 Pain in right foot: Secondary | ICD-10-CM

## 2017-08-09 NOTE — Progress Notes (Signed)
Subjective: Aaron Mclean is a 66 y.o. male patient seen today in office for POV #6 (DOS 07-02-17), S/P Right removal of bone/exostectomy with wound debridement with placement of Integra graft. Patient denies current pain at surgical site, Reports that he had home nursing visit him once this past week on Tuesday. Patient denies denies calf pain, denies headache, chest pain, shortness of breath, nausea, vomiting, fever, or chills.   Patient Active Problem List   Diagnosis Date Noted  . Benign prostatic hyperplasia with urinary obstruction 09/20/2015  . Family history of malignant neoplasm of prostate 09/20/2015  . Excessive urination at night 09/20/2015    Current Outpatient Prescriptions on File Prior to Visit  Medication Sig Dispense Refill  . acetaminophen (TYLENOL) 325 MG tablet Take 650 mg by mouth 3 (three) times daily as needed for moderate pain or headache.    Marland Kitchen aspirin EC 81 MG tablet Take 81 mg by mouth daily.     Marland Kitchen docusate sodium (COLACE) 100 MG capsule Take 1 capsule (100 mg total) by mouth 2 (two) times daily. 10 capsule 0  . doxycycline (VIBRA-TABS) 100 MG tablet Take 100 mg by mouth 2 (two) times daily.    Marland Kitchen gabapentin (NEURONTIN) 300 MG capsule Take 300 mg by mouth 3 (three) times daily as needed (neuropathy).    Marland Kitchen ibuprofen (ADVIL,MOTRIN) 200 MG tablet Take 400 mg by mouth every 6 (six) hours as needed for headache or moderate pain.    Marland Kitchen LEVEMIR FLEXTOUCH 100 UNIT/ML Pen Inject 5-20 Units into the skin at bedtime as needed (high blood sugar).     Marland Kitchen losartan-hydrochlorothiazide (HYZAAR) 100-25 MG tablet Take 1 tablet by mouth daily.     . metroNIDAZOLE (FLAGYL) 500 MG tablet Take 500 mg by mouth 3 (three) times daily.    . Multiple Vitamin (MULTIVITAMIN WITH MINERALS) TABS tablet Take 1 tablet by mouth 2 (two) times a week.    . nebivolol (BYSTOLIC) 10 MG tablet Take 10 mg by mouth daily.    . promethazine (PHENERGAN) 12.5 MG tablet Take 1 tablet (12.5 mg total) by mouth every 6  (six) hours as needed for nausea or vomiting. 30 tablet 0  . spironolactone (ALDACTONE) 25 MG tablet Take 25 mg by mouth daily.     No current facility-administered medications on file prior to visit.     Allergies  Allergen Reactions  . No Known Allergies     Objective: There were no vitals filed for this visit.  General: No acute distress, AAOx3  Right foot: Graft appears incorporated. The wound measures 3.5 x 1 cm with granular wound bed with no surrounding maceration, no erythema, no warmth, no drainage, no other acute signs of infection noted, Capillary fill time <3 seconds in all remaining digits, gross sensation present via light touch to right foot. No pain with calf compression.   Assessment and Plan:  Problem List Items Addressed This Visit    None    Visit Diagnoses    S/P foot surgery, right    -  Primary   Diabetic ulcer of other part of right foot associated with diabetes mellitus due to underlying condition, limited to breakdown of skin ([place])       Diabetic polyneuropathy associated with type 2 diabetes mellitus ([place])       Right foot pain          -Patient seen and evaluated -Non-excisionally debrided using a moist saline gauze to healthy bleeding to the wound base at the right  lateral foot -Applied PRISMA dry sterile dressing to surgical site right foot secured with ACE wrap and stockinet  -Advised patient to make sure to keep dressings clean, dry, and intact to right surgical site -Advised patient to continue with post-op shoe on right foot and weightbearing to heel only -[facility] to continue with local wound care consisting of Prisma and dry dressing to site and for /home safety to make sure Patient is staying off the foot as much as possible as previous  -Advised patient to limit activity to necessity  -Advised patient to ice and elevate as necessary  -Will plan for continued wound care at next office visit. In the meantime, patient to call office  if any issues or problems arise.   [name], DPM

## 2017-08-14 DIAGNOSIS — I10 Essential (primary) hypertension: Secondary | ICD-10-CM | POA: Diagnosis not present

## 2017-08-14 DIAGNOSIS — E1159 Type 2 diabetes mellitus with other circulatory complications: Secondary | ICD-10-CM | POA: Diagnosis not present

## 2017-08-14 DIAGNOSIS — T8130XA Disruption of wound, unspecified, initial encounter: Secondary | ICD-10-CM | POA: Diagnosis not present

## 2017-08-14 DIAGNOSIS — L97416 Non-pressure chronic ulcer of right heel and midfoot with bone involvement without evidence of necrosis: Secondary | ICD-10-CM | POA: Diagnosis not present

## 2017-08-14 DIAGNOSIS — E11621 Type 2 diabetes mellitus with foot ulcer: Secondary | ICD-10-CM | POA: Diagnosis not present

## 2017-08-14 DIAGNOSIS — Z794 Long term (current) use of insulin: Secondary | ICD-10-CM | POA: Diagnosis not present

## 2017-08-14 DIAGNOSIS — E1140 Type 2 diabetes mellitus with diabetic neuropathy, unspecified: Secondary | ICD-10-CM | POA: Diagnosis not present

## 2017-08-14 DIAGNOSIS — Z23 Encounter for immunization: Secondary | ICD-10-CM | POA: Diagnosis not present

## 2017-08-14 DIAGNOSIS — N401 Enlarged prostate with lower urinary tract symptoms: Secondary | ICD-10-CM | POA: Diagnosis not present

## 2017-08-14 DIAGNOSIS — E1169 Type 2 diabetes mellitus with other specified complication: Secondary | ICD-10-CM | POA: Diagnosis not present

## 2017-08-14 DIAGNOSIS — Z7982 Long term (current) use of aspirin: Secondary | ICD-10-CM | POA: Diagnosis not present

## 2017-08-14 DIAGNOSIS — I5030 Unspecified diastolic (congestive) heart failure: Secondary | ICD-10-CM | POA: Diagnosis not present

## 2017-08-14 DIAGNOSIS — R278 Other lack of coordination: Secondary | ICD-10-CM | POA: Diagnosis not present

## 2017-08-16 ENCOUNTER — Ambulatory Visit (INDEPENDENT_AMBULATORY_CARE_PROVIDER_SITE_OTHER): Payer: PPO | Admitting: Sports Medicine

## 2017-08-16 DIAGNOSIS — Z9889 Other specified postprocedural states: Secondary | ICD-10-CM

## 2017-08-16 DIAGNOSIS — E1142 Type 2 diabetes mellitus with diabetic polyneuropathy: Secondary | ICD-10-CM

## 2017-08-16 DIAGNOSIS — E08621 Diabetes mellitus due to underlying condition with foot ulcer: Secondary | ICD-10-CM

## 2017-08-16 DIAGNOSIS — L97511 Non-pressure chronic ulcer of other part of right foot limited to breakdown of skin: Secondary | ICD-10-CM

## 2017-08-16 DIAGNOSIS — M79671 Pain in right foot: Secondary | ICD-10-CM | POA: Diagnosis not present

## 2017-08-16 NOTE — Progress Notes (Signed)
Subjective: Aaron Mclean is a 66 y.o. male patient seen today in office for POV #7 (DOS 07-02-17), S/P Right removal of bone/exostectomy with wound debridement with placement of Integra graft. Patient denies current pain at surgical site, Reports that he has been doing better and resting his foot. Patient denies calf pain, denies headache, chest pain, shortness of breath, nausea, vomiting, fever, or chills.   Patient Active Problem List   Diagnosis Date Noted  . Benign prostatic hyperplasia with urinary obstruction 09/20/2015  . Family history of malignant neoplasm of prostate 09/20/2015  . Excessive urination at night 09/20/2015    Current Outpatient Prescriptions on File Prior to Visit  Medication Sig Dispense Refill  . acetaminophen (TYLENOL) 325 MG tablet Take 650 mg by mouth 3 (three) times daily as needed for moderate pain or headache.    Marland Kitchen aspirin EC 81 MG tablet Take 81 mg by mouth daily.     Marland Kitchen docusate sodium (COLACE) 100 MG capsule Take 1 capsule (100 mg total) by mouth 2 (two) times daily. 10 capsule 0  . doxycycline (VIBRA-TABS) 100 MG tablet Take 100 mg by mouth 2 (two) times daily.    Marland Kitchen gabapentin (NEURONTIN) 300 MG capsule Take 300 mg by mouth 3 (three) times daily as needed (neuropathy).    Marland Kitchen ibuprofen (ADVIL,MOTRIN) 200 MG tablet Take 400 mg by mouth every 6 (six) hours as needed for headache or moderate pain.    Marland Kitchen LEVEMIR FLEXTOUCH 100 UNIT/ML Pen Inject 5-20 Units into the skin at bedtime as needed (high blood sugar).     Marland Kitchen losartan-hydrochlorothiazide (HYZAAR) 100-25 MG tablet Take 1 tablet by mouth daily.     . metroNIDAZOLE (FLAGYL) 500 MG tablet Take 500 mg by mouth 3 (three) times daily.    . Multiple Vitamin (MULTIVITAMIN WITH MINERALS) TABS tablet Take 1 tablet by mouth 2 (two) times a week.    . nebivolol (BYSTOLIC) 10 MG tablet Take 10 mg by mouth daily.    . promethazine (PHENERGAN) 12.5 MG tablet Take 1 tablet (12.5 mg total) by mouth every 6 (six) hours as needed  for nausea or vomiting. 30 tablet 0  . spironolactone (ALDACTONE) 25 MG tablet Take 25 mg by mouth daily.     No current facility-administered medications on file prior to visit.     Allergies  Allergen Reactions  . No Known Allergies     Objective: There were no vitals filed for this visit.  General: No acute distress, AAOx3  Right foot: Graft appears incorporated. The wound measures 3.0 x 1 cm with granular wound bed with no surrounding maceration, no erythema, no warmth, no drainage, no other acute signs of infection noted, Capillary fill time <3 seconds in all remaining digits, gross sensation present via light touch to right foot. No pain with calf compression.   Assessment and Plan:  Problem List Items Addressed This Visit    None    Visit Diagnoses    S/P foot surgery, right    -  Primary   Diabetic ulcer of other part of right foot associated with diabetes mellitus due to underlying condition, limited to breakdown of skin ([place])       Diabetic polyneuropathy associated with type 2 diabetes mellitus ([place])       Right foot pain          -Patient seen and evaluated -Non-excisionally debrided using a moist saline gauze to healthy bleeding to the wound base at the right lateral foot. Post wound  debridement measurements above.  -Applied one third of the Oasis tri-layer matrix 3 x 7 cm graft, lot number [id number] secured with Steri-Strips and PRISMA and dry sterile dressing to surgical site right foot secured with ACE wrap and stockinet  -Advised patient to make sure to keep dressings clean, dry, and intact to right surgical site -Advised patient to continue with post-op shoe on right foot and weightbearing to heel only -[facility] to continue with local wound care consisting of Prisma and dry dressing to site and for /home safety to make sure Patient is staying off the foot as much as possible as previously discussed  -Advised patient to limit activity to necessity   -Advised patient to ice and elevate as necessary  -Will plan for continued wound care at next office visit. In the meantime, patient to call office if any issues or problems arise.   [name], DPM

## 2017-08-31 ENCOUNTER — Ambulatory Visit (INDEPENDENT_AMBULATORY_CARE_PROVIDER_SITE_OTHER): Payer: PPO | Admitting: Sports Medicine

## 2017-08-31 DIAGNOSIS — E08621 Diabetes mellitus due to underlying condition with foot ulcer: Secondary | ICD-10-CM

## 2017-08-31 DIAGNOSIS — Z9889 Other specified postprocedural states: Secondary | ICD-10-CM | POA: Diagnosis not present

## 2017-08-31 DIAGNOSIS — L97511 Non-pressure chronic ulcer of other part of right foot limited to breakdown of skin: Secondary | ICD-10-CM

## 2017-08-31 DIAGNOSIS — E1142 Type 2 diabetes mellitus with diabetic polyneuropathy: Secondary | ICD-10-CM

## 2017-08-31 DIAGNOSIS — M79671 Pain in right foot: Secondary | ICD-10-CM

## 2017-09-01 NOTE — Progress Notes (Signed)
Subjective: Aaron Mclean is a 66 y.o. male patient seen today in office for POV #8 (DOS 07-02-17), S/P Right removal of bone/exostectomy with wound debridement with placement of Integra graft. Patient denies current pain at surgical site, Reports that he has been doing better and resting his foot. States that nursing did not come last week so changed outer dressing himself. Patient denies calf pain, denies headache, chest pain, shortness of breath, nausea, vomiting, fever, or chills.   Patient Active Problem List   Diagnosis Date Noted  . Benign prostatic hyperplasia with urinary obstruction 09/20/2015  . Family history of malignant neoplasm of prostate 09/20/2015  . Excessive urination at night 09/20/2015    Current Outpatient Prescriptions on File Prior to Visit  Medication Sig Dispense Refill  . acetaminophen (TYLENOL) 325 MG tablet Take 650 mg by mouth 3 (three) times daily as needed for moderate pain or headache.    Marland Kitchen aspirin EC 81 MG tablet Take 81 mg by mouth daily.     Marland Kitchen docusate sodium (COLACE) 100 MG capsule Take 1 capsule (100 mg total) by mouth 2 (two) times daily. 10 capsule 0  . doxycycline (VIBRA-TABS) 100 MG tablet Take 100 mg by mouth 2 (two) times daily.    Marland Kitchen gabapentin (NEURONTIN) 300 MG capsule Take 300 mg by mouth 3 (three) times daily as needed (neuropathy).    Marland Kitchen ibuprofen (ADVIL,MOTRIN) 200 MG tablet Take 400 mg by mouth every 6 (six) hours as needed for headache or moderate pain.    Marland Kitchen LEVEMIR FLEXTOUCH 100 UNIT/ML Pen Inject 5-20 Units into the skin at bedtime as needed (high blood sugar).     Marland Kitchen losartan-hydrochlorothiazide (HYZAAR) 100-25 MG tablet Take 1 tablet by mouth daily.     . metroNIDAZOLE (FLAGYL) 500 MG tablet Take 500 mg by mouth 3 (three) times daily.    . Multiple Vitamin (MULTIVITAMIN WITH MINERALS) TABS tablet Take 1 tablet by mouth 2 (two) times a week.    . nebivolol (BYSTOLIC) 10 MG tablet Take 10 mg by mouth daily.    . promethazine (PHENERGAN) 12.5  MG tablet Take 1 tablet (12.5 mg total) by mouth every 6 (six) hours as needed for nausea or vomiting. 30 tablet 0  . spironolactone (ALDACTONE) 25 MG tablet Take 25 mg by mouth daily.     No current facility-administered medications on file prior to visit.     Allergies  Allergen Reactions  . No Known Allergies     Objective: There were no vitals filed for this visit.  General: No acute distress, AAOx3  Right foot: Graft incorporated. The wound measures 1.5 x 1 cm with granular wound bed with no surrounding maceration, no erythema, no warmth, no drainage, no other acute signs of infection noted, Capillary fill time <3 seconds in all remaining digits, gross sensation present via light touch to right foot. No pain with calf compression.   Assessment and Plan:  Problem List Items Addressed This Visit    None    Visit Diagnoses    S/P foot surgery, right    -  Primary   Diabetic ulcer of other part of right foot associated with diabetes mellitus due to underlying condition, limited to breakdown of skin ([place])       Diabetic polyneuropathy associated with type 2 diabetes mellitus ([place])       Right foot pain         -Patient seen and evaluated -Non-excisionally debrided using a moist saline gauze to healthy bleeding  to the wound base at the right lateral foot. Post wound debridement measurements above.  -Applied one third of the Oasis tri-layer matrix 3 x 7 cm graft, lot number [id number] secured with Steri-Strips and PRISMA and dry sterile dressing to surgical site right foot secured with ACE wrap and stockinet  -Advised patient to make sure to keep dressings clean, dry, and intact to right surgical site -Advised patient to continue with post-op shoe on right foot and weightbearing to heel only -[facility] to continue with local wound care consisting of Prisma and dry dressing to site and for /home safety to make sure Patient is staying off the foot as much as possible as  previously discussed  -Advised patient to limit activity to necessity  -Advised patient to ice and elevate as necessary  -Will plan for continued wound care at next office visit. In the meantime, patient to call office if any issues or problems arise.   [name], DPM

## 2017-09-12 ENCOUNTER — Ambulatory Visit (INDEPENDENT_AMBULATORY_CARE_PROVIDER_SITE_OTHER): Payer: PPO | Admitting: Sports Medicine

## 2017-09-12 ENCOUNTER — Telehealth: Payer: Self-pay | Admitting: Sports Medicine

## 2017-09-12 DIAGNOSIS — E08621 Diabetes mellitus due to underlying condition with foot ulcer: Secondary | ICD-10-CM

## 2017-09-12 DIAGNOSIS — M79671 Pain in right foot: Secondary | ICD-10-CM

## 2017-09-12 DIAGNOSIS — Z9889 Other specified postprocedural states: Secondary | ICD-10-CM | POA: Diagnosis not present

## 2017-09-12 DIAGNOSIS — E1142 Type 2 diabetes mellitus with diabetic polyneuropathy: Secondary | ICD-10-CM | POA: Diagnosis not present

## 2017-09-12 DIAGNOSIS — L97511 Non-pressure chronic ulcer of other part of right foot limited to breakdown of skin: Secondary | ICD-10-CM | POA: Diagnosis not present

## 2017-09-12 MED ORDER — DOXYCYCLINE HYCLATE 100 MG PO TABS: 100.0000 mg | ORAL_TABLET | Freq: Two times a day (BID) | ORAL | 0 refills | Status: DC

## 2017-09-12 NOTE — Telephone Encounter (Signed)
He still has a wound that needs to be changed twice weekly. Apply prisma and dry dressing to right foot -Dr. Chauncey Cruel

## 2017-09-12 NOTE — Progress Notes (Signed)
Subjective: Aaron Mclean is a 66 y.o. male patient seen today in office for POV #9 (DOS 07-02-17), S/P Right removal of bone/exostectomy with wound debridement with placement of Integra graft. Patient denies current pain at surgical site, Reports that he has been doing better and resting his foot. States that again he had to change outer dressings himself. Patient denies calf pain, denies headache, chest pain, shortness of breath, nausea, vomiting, fever, or chills.   Patient Active Problem List   Diagnosis Date Noted  . Benign prostatic hyperplasia with urinary obstruction 09/20/2015  . Family history of malignant neoplasm of prostate 09/20/2015  . Excessive urination at night 09/20/2015    Current Outpatient Prescriptions on File Prior to Visit  Medication Sig Dispense Refill  . acetaminophen (TYLENOL) 325 MG tablet Take 650 mg by mouth 3 (three) times daily as needed for moderate pain or headache.    Marland Kitchen aspirin EC 81 MG tablet Take 81 mg by mouth daily.     Marland Kitchen docusate sodium (COLACE) 100 MG capsule Take 1 capsule (100 mg total) by mouth 2 (two) times daily. 10 capsule 0  . doxycycline (VIBRA-TABS) 100 MG tablet Take 100 mg by mouth 2 (two) times daily.    Marland Kitchen gabapentin (NEURONTIN) 300 MG capsule Take 300 mg by mouth 3 (three) times daily as needed (neuropathy).    Marland Kitchen ibuprofen (ADVIL,MOTRIN) 200 MG tablet Take 400 mg by mouth every 6 (six) hours as needed for headache or moderate pain.    Marland Kitchen LEVEMIR FLEXTOUCH 100 UNIT/ML Pen Inject 5-20 Units into the skin at bedtime as needed (high blood sugar).     Marland Kitchen losartan-hydrochlorothiazide (HYZAAR) 100-25 MG tablet Take 1 tablet by mouth daily.     . metroNIDAZOLE (FLAGYL) 500 MG tablet Take 500 mg by mouth 3 (three) times daily.    . Multiple Vitamin (MULTIVITAMIN WITH MINERALS) TABS tablet Take 1 tablet by mouth 2 (two) times a week.    . nebivolol (BYSTOLIC) 10 MG tablet Take 10 mg by mouth daily.    . promethazine (PHENERGAN) 12.5 MG tablet Take 1  tablet (12.5 mg total) by mouth every 6 (six) hours as needed for nausea or vomiting. 30 tablet 0  . spironolactone (ALDACTONE) 25 MG tablet Take 25 mg by mouth daily.     No current facility-administered medications on file prior to visit.     Allergies  Allergen Reactions  . No Known Allergies     Objective: There were no vitals filed for this visit.  General: No acute distress, AAOx3  Right foot: Graft incorporated. The wound measures 1.5 x 0.5 cm with granular wound bed with no surrounding maceration, no erythema, no warmth, no drainage, no other acute signs of infection noted, Capillary fill time <3 seconds in all remaining digits, gross sensation present via light touch to right foot. No pain with calf compression.   Assessment and Plan:  Problem List Items Addressed This Visit    None    Visit Diagnoses    Diabetic ulcer of other part of right foot associated with diabetes mellitus due to underlying condition, limited to breakdown of skin ([place])    -  Primary   S/P foot surgery, right       Diabetic polyneuropathy associated with type 2 diabetes mellitus ([place])       Right foot pain         -Patient seen and evaluated -Non-excisionally debrided using a moist saline gauze to healthy bleeding to the wound  base at the right lateral foot. Post wound debridement measurements above.  -Applied one third of the Oasis tri-layer matrix 3 x 7 cm graft, lot number [id number] secured with Steri-Strips and PRISMA and dry sterile dressing to surgical site right foot secured with ACE wrap and stockinet  -Advised patient to make sure to keep dressings clean, dry, and intact to right surgical site -Advised patient to continue with post-op shoe on right foot and weightbearing to heel only -[facility] to continue with local wound care consisting of Prisma and dry dressing to site and for /home safety to make sure Patient is staying off the foot as much as possible as previously discussed   -Replaced post op shoe -Advised patient to limit activity to necessity  -Advised patient to ice and elevate as necessary  -Will plan for continued wound care at next office visit. In the meantime, patient to call office if any issues or problems arise.   [name], DPM

## 2017-09-12 NOTE — Telephone Encounter (Signed)
[place] with [facility] called requesting to get orders for discharge pushed to this week. Can be reached at [phone].

## 2017-09-13 NOTE — Telephone Encounter (Signed)
Thank you :)
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
## 2017-09-18 ENCOUNTER — Telehealth: Payer: Self-pay | Admitting: Sports Medicine
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
## 2017-09-18 NOTE — Telephone Encounter (Signed)
Thanks

## 2017-09-19 DIAGNOSIS — I10 Essential (primary) hypertension: Secondary | ICD-10-CM | POA: Diagnosis not present

## 2017-09-19 DIAGNOSIS — R9431 Abnormal electrocardiogram [ECG] [EKG]: Secondary | ICD-10-CM | POA: Diagnosis not present

## 2017-09-19 DIAGNOSIS — I499 Cardiac arrhythmia, unspecified: Secondary | ICD-10-CM | POA: Diagnosis not present

## 2017-09-19 DIAGNOSIS — E119 Type 2 diabetes mellitus without complications: Secondary | ICD-10-CM | POA: Diagnosis not present

## 2017-09-19 DIAGNOSIS — I498 Other specified cardiac arrhythmias: Secondary | ICD-10-CM | POA: Diagnosis not present

## 2017-09-19 DIAGNOSIS — R931 Abnormal findings on diagnostic imaging of heart and coronary circulation: Secondary | ICD-10-CM | POA: Diagnosis not present

## 2017-09-19 DIAGNOSIS — Z8249 Family history of ischemic heart disease and other diseases of the circulatory system: Secondary | ICD-10-CM | POA: Diagnosis not present

## 2017-09-19 DIAGNOSIS — I2510 Atherosclerotic heart disease of native coronary artery without angina pectoris: Secondary | ICD-10-CM | POA: Diagnosis not present

## 2017-09-19 DIAGNOSIS — R0600 Dyspnea, unspecified: Secondary | ICD-10-CM | POA: Diagnosis not present

## 2017-09-19 DIAGNOSIS — R531 Weakness: Secondary | ICD-10-CM | POA: Diagnosis not present

## 2017-09-19 DIAGNOSIS — I429 Cardiomyopathy, unspecified: Secondary | ICD-10-CM | POA: Diagnosis not present

## 2017-09-19 DIAGNOSIS — E785 Hyperlipidemia, unspecified: Secondary | ICD-10-CM | POA: Diagnosis not present

## 2017-09-26 ENCOUNTER — Ambulatory Visit (INDEPENDENT_AMBULATORY_CARE_PROVIDER_SITE_OTHER): Payer: PPO | Admitting: Sports Medicine

## 2017-09-26 DIAGNOSIS — E08621 Diabetes mellitus due to underlying condition with foot ulcer: Secondary | ICD-10-CM

## 2017-09-26 DIAGNOSIS — L97511 Non-pressure chronic ulcer of other part of right foot limited to breakdown of skin: Secondary | ICD-10-CM

## 2017-09-26 DIAGNOSIS — M79671 Pain in right foot: Secondary | ICD-10-CM

## 2017-09-26 DIAGNOSIS — E1142 Type 2 diabetes mellitus with diabetic polyneuropathy: Secondary | ICD-10-CM

## 2017-09-26 DIAGNOSIS — Z9889 Other specified postprocedural states: Secondary | ICD-10-CM

## 2017-09-26 NOTE — Progress Notes (Signed)
Subjective: Aaron Mclean is a 66 y.o. male patient seen today in office for POV #10 (DOS 07-02-17), S/P Right removal of bone/exostectomy with wound debridement with placement of Integra graft. Patient denies current pain at surgical site, Reports that he changed outer dressing himself. Patient denies calf pain, denies headache, chest pain, shortness of breath, nausea, vomiting, fever, or chills.   Patient Active Problem List   Diagnosis Date Noted  . Benign prostatic hyperplasia with urinary obstruction 09/20/2015  . Family history of malignant neoplasm of prostate 09/20/2015  . Excessive urination at night 09/20/2015    Current Outpatient Prescriptions on File Prior to Visit  Medication Sig Dispense Refill  . acetaminophen (TYLENOL) 325 MG tablet Take 650 mg by mouth 3 (three) times daily as needed for moderate pain or headache.    Marland Kitchen aspirin EC 81 MG tablet Take 81 mg by mouth daily.     Marland Kitchen docusate sodium (COLACE) 100 MG capsule Take 1 capsule (100 mg total) by mouth 2 (two) times daily. 10 capsule 0  . doxycycline (VIBRA-TABS) 100 MG tablet Take 1 tablet (100 mg total) by mouth 2 (two) times daily. 20 tablet 0  . gabapentin (NEURONTIN) 300 MG capsule Take 300 mg by mouth 3 (three) times daily as needed (neuropathy).    Marland Kitchen ibuprofen (ADVIL,MOTRIN) 200 MG tablet Take 400 mg by mouth every 6 (six) hours as needed for headache or moderate pain.    Marland Kitchen LEVEMIR FLEXTOUCH 100 UNIT/ML Pen Inject 5-20 Units into the skin at bedtime as needed (high blood sugar).     Marland Kitchen losartan-hydrochlorothiazide (HYZAAR) 100-25 MG tablet Take 1 tablet by mouth daily.     . metroNIDAZOLE (FLAGYL) 500 MG tablet Take 500 mg by mouth 3 (three) times daily.    . Multiple Vitamin (MULTIVITAMIN WITH MINERALS) TABS tablet Take 1 tablet by mouth 2 (two) times a week.    . nebivolol (BYSTOLIC) 10 MG tablet Take 10 mg by mouth daily.    . promethazine (PHENERGAN) 12.5 MG tablet Take 1 tablet (12.5 mg total) by mouth every 6 (six)  hours as needed for nausea or vomiting. 30 tablet 0  . spironolactone (ALDACTONE) 25 MG tablet Take 25 mg by mouth daily.     No current facility-administered medications on file prior to visit.     Allergies  Allergen Reactions  . No Known Allergies     Objective: There were no vitals filed for this visit.  General: No acute distress, AAOx3  Right foot: Graft incorporated. The wound measures 1.0 x 0.5 cm with granular wound bed with no surrounding maceration, no erythema, no warmth, no drainage, no other acute signs of infection noted, Capillary fill time <3 seconds in all remaining digits, gross sensation present via light touch to right foot. No pain with calf compression.   Assessment and Plan:  Problem List Items Addressed This Visit    None    Visit Diagnoses    Diabetic ulcer of other part of right foot associated with diabetes mellitus due to underlying condition, limited to breakdown of skin ([place])    -  Primary   S/P foot surgery, right       Diabetic polyneuropathy associated with type 2 diabetes mellitus ([place])       Right foot pain         -Patient seen and evaluated -Non-excisionally debrided using a moist saline gauze to healthy bleeding to the wound base at the right lateral foot. Post wound debridement measurements  above.  -Applied one third of the Oasis tri-layer matrix 3 x 7 cm graft, lot number [id number] secured with Steri-Strips and PRISMA and dry sterile dressing to surgical site right foot secured with ACE wrap and stockinet  -Advised patient to make sure to keep dressings clean, dry, and intact to right surgical site -Advised patient to continue with post-op shoe on right foot and weightbearing to heel only -Patient to continue with outer dressing changes as needed  -Continue with post op shoe -Advised patient to limit activity to necessity  -Advised patient to ice and elevate as necessary  -Will plan for continued wound care at next office visit. In the  meantime, patient to call office if any issues or problems arise.   [name], DPM

## 2017-10-10 ENCOUNTER — Ambulatory Visit (INDEPENDENT_AMBULATORY_CARE_PROVIDER_SITE_OTHER): Payer: PPO | Admitting: Sports Medicine

## 2017-10-10 DIAGNOSIS — Z9889 Other specified postprocedural states: Secondary | ICD-10-CM

## 2017-10-10 DIAGNOSIS — M79671 Pain in right foot: Secondary | ICD-10-CM | POA: Diagnosis not present

## 2017-10-10 DIAGNOSIS — L97511 Non-pressure chronic ulcer of other part of right foot limited to breakdown of skin: Secondary | ICD-10-CM

## 2017-10-10 DIAGNOSIS — E08621 Diabetes mellitus due to underlying condition with foot ulcer: Secondary | ICD-10-CM

## 2017-10-10 DIAGNOSIS — E1142 Type 2 diabetes mellitus with diabetic polyneuropathy: Secondary | ICD-10-CM | POA: Diagnosis not present

## 2017-10-10 NOTE — Progress Notes (Signed)
Subjective: GERRELL TABET is a 66 y.o. male patient seen today in office for POV #11 (DOS 07-02-17), S/P Right removal of bone/exostectomy with wound debridement with placement of Integra graft. Patient denies current pain at surgical site, Reports that he changed outer dressing himself every other day with no issues. Patient denies calf pain, denies headache, chest pain, shortness of breath, nausea, vomiting, fever, or chills.   Patient Active Problem List   Diagnosis Date Noted  . Benign prostatic hyperplasia with urinary obstruction 09/20/2015  . Family history of malignant neoplasm of prostate 09/20/2015  . Excessive urination at night 09/20/2015    Current Outpatient Prescriptions on File Prior to Visit  Medication Sig Dispense Refill  . acetaminophen (TYLENOL) 325 MG tablet Take 650 mg by mouth 3 (three) times daily as needed for moderate pain or headache.    Marland Kitchen aspirin EC 81 MG tablet Take 81 mg by mouth daily.     Marland Kitchen docusate sodium (COLACE) 100 MG capsule Take 1 capsule (100 mg total) by mouth 2 (two) times daily. 10 capsule 0  . doxycycline (VIBRA-TABS) 100 MG tablet Take 1 tablet (100 mg total) by mouth 2 (two) times daily. 20 tablet 0  . gabapentin (NEURONTIN) 300 MG capsule Take 300 mg by mouth 3 (three) times daily as needed (neuropathy).    Marland Kitchen ibuprofen (ADVIL,MOTRIN) 200 MG tablet Take 400 mg by mouth every 6 (six) hours as needed for headache or moderate pain.    Marland Kitchen LEVEMIR FLEXTOUCH 100 UNIT/ML Pen Inject 5-20 Units into the skin at bedtime as needed (high blood sugar).     Marland Kitchen losartan-hydrochlorothiazide (HYZAAR) 100-25 MG tablet Take 1 tablet by mouth daily.     . metroNIDAZOLE (FLAGYL) 500 MG tablet Take 500 mg by mouth 3 (three) times daily.    . Multiple Vitamin (MULTIVITAMIN WITH MINERALS) TABS tablet Take 1 tablet by mouth 2 (two) times a week.    . nebivolol (BYSTOLIC) 10 MG tablet Take 10 mg by mouth daily.    . promethazine (PHENERGAN) 12.5 MG tablet Take 1 tablet (12.5 mg  total) by mouth every 6 (six) hours as needed for nausea or vomiting. 30 tablet 0  . spironolactone (ALDACTONE) 25 MG tablet Take 25 mg by mouth daily.     No current facility-administered medications on file prior to visit.     Allergies  Allergen Reactions  . No Known Allergies     Objective: There were no vitals filed for this visit.  General: No acute distress, AAOx3  Right foot: Graft incorporated. The wound measures 0.1 x 0.1 cm with granular wound bed with no surrounding maceration, no erythema, no warmth, no drainage, no other acute signs of infection noted, Capillary fill time <3 seconds in all remaining digits, s/p previous digital amputation with foot deformity, gross sensation present via light touch to right foot, protective absent. No pain with calf compression.   Assessment and Plan:  Problem List Items Addressed This Visit    None    Visit Diagnoses    Diabetic ulcer of other part of right foot associated with diabetes mellitus due to underlying condition, limited to breakdown of skin ([place])    -  Primary   S/P foot surgery, right       Diabetic polyneuropathy associated with type 2 diabetes mellitus ([place])       Right foot pain         -Patient seen and evaluated -Non-excisionally debrided using a moist saline gauze to  healthy bleeding to the wound base at the right lateral foot. Post wound debridement measurements above.  -Applied one fourth of the Oasis tri-layer matrix 3 x 7 cm graft, lot number [id number] secured with Steri-Strips and PRISMA and dry sterile dressing to surgical site right foot secured with ACE wrap and stockinet  -Advised patient to make sure to keep dressings clean, dry, and intact to right surgical site for today, may change outer dressings in 2 days and thereafter every other day as needed if soiled or dirty -Advised patient to continue with post-op shoe on right foot and weightbearing to heel only -Advised patient to limit activity to necessity   -Advised patient to ice and elevate as necessary -[employer] diabetic shoe order form was completed; office to contact primary care for approval / certification;  Office to arrange shoe fitting and dispensing.  -Will plan for continued wound care at next office visit and if healed will plan to keep patient in post op shoe until diabetic shoes with custom insoles are recieved. In the meantime, patient to call office if any issues or problems arise.   [name], DPM

## 2017-10-25 ENCOUNTER — Ambulatory Visit: Payer: PPO | Admitting: *Deleted

## 2017-10-25 ENCOUNTER — Ambulatory Visit (INDEPENDENT_AMBULATORY_CARE_PROVIDER_SITE_OTHER): Payer: PPO | Admitting: Sports Medicine

## 2017-10-25 ENCOUNTER — Encounter: Payer: Self-pay | Admitting: Sports Medicine

## 2017-10-25 DIAGNOSIS — Z89421 Acquired absence of other right toe(s): Secondary | ICD-10-CM

## 2017-10-25 DIAGNOSIS — Z9889 Other specified postprocedural states: Secondary | ICD-10-CM

## 2017-10-25 DIAGNOSIS — E08621 Diabetes mellitus due to underlying condition with foot ulcer: Secondary | ICD-10-CM

## 2017-10-25 DIAGNOSIS — M79671 Pain in right foot: Secondary | ICD-10-CM

## 2017-10-25 DIAGNOSIS — L97511 Non-pressure chronic ulcer of other part of right foot limited to breakdown of skin: Secondary | ICD-10-CM

## 2017-10-25 DIAGNOSIS — E1142 Type 2 diabetes mellitus with diabetic polyneuropathy: Secondary | ICD-10-CM

## 2017-10-25 NOTE — Progress Notes (Signed)
Subjective: Aaron Mclean is a 66 y.o. male patient seen today in office for POV #12 (DOS 07-02-17), S/P Right removal of bone/exostectomy with wound debridement with placement of Integra graft. Patient denies current pain at surgical site, Reports that he is changing outer dressing himself every other day and as needed is apply Prisma with no issues. Patient denies calf pain, denies headache, chest pain, shortness of breath, nausea, vomiting, fever, or chills.   Patient Active Problem List   Diagnosis Date Noted  . Benign prostatic hyperplasia with urinary obstruction 09/20/2015  . Family history of malignant neoplasm of prostate 09/20/2015  . Excessive urination at night 09/20/2015    Current Outpatient Medications on File Prior to Visit  Medication Sig Dispense Refill  . acetaminophen (TYLENOL) 325 MG tablet Take 650 mg by mouth 3 (three) times daily as needed for moderate pain or headache.    Marland Kitchen aspirin EC 81 MG tablet Take 81 mg by mouth daily.     Marland Kitchen docusate sodium (COLACE) 100 MG capsule Take 1 capsule (100 mg total) by mouth 2 (two) times daily. 10 capsule 0  . doxycycline (VIBRA-TABS) 100 MG tablet Take 1 tablet (100 mg total) by mouth 2 (two) times daily. 20 tablet 0  . gabapentin (NEURONTIN) 300 MG capsule Take 300 mg by mouth 3 (three) times daily as needed (neuropathy).    Marland Kitchen ibuprofen (ADVIL,MOTRIN) 200 MG tablet Take 400 mg by mouth every 6 (six) hours as needed for headache or moderate pain.    Marland Kitchen LEVEMIR FLEXTOUCH 100 UNIT/ML Pen Inject 5-20 Units into the skin at bedtime as needed (high blood sugar).     Marland Kitchen losartan-hydrochlorothiazide (HYZAAR) 100-25 MG tablet Take 1 tablet by mouth daily.     . metroNIDAZOLE (FLAGYL) 500 MG tablet Take 500 mg by mouth 3 (three) times daily.    . Multiple Vitamin (MULTIVITAMIN WITH MINERALS) TABS tablet Take 1 tablet by mouth 2 (two) times a week.    . nebivolol (BYSTOLIC) 10 MG tablet Take 10 mg by mouth daily.    . promethazine (PHENERGAN) 12.5  MG tablet Take 1 tablet (12.5 mg total) by mouth every 6 (six) hours as needed for nausea or vomiting. 30 tablet 0  . spironolactone (ALDACTONE) 25 MG tablet Take 25 mg by mouth daily.     No current facility-administered medications on file prior to visit.     Allergies  Allergen Reactions  . No Known Allergies     Objective: There were no vitals filed for this visit.  General: No acute distress, AAOx3  Right foot: Graft incorporated. Wound bed prematurely healed with scabbing with no surrounding maceration, no erythema, no warmth, no drainage, no other acute signs of infection noted, Capillary fill time <3 seconds in all remaining digits, s/p previous digital amputation with foot deformity, gross sensation present via light touch to right foot, protective absent. No pain with calf compression.   Assessment and Plan:  Problem List Items Addressed This Visit    None    Visit Diagnoses    Diabetic ulcer of other part of right foot associated with diabetes mellitus due to underlying condition, limited to breakdown of skin ([place])    -  Primary   Prematurely healed   S/P foot surgery, right       Diabetic polyneuropathy associated with type 2 diabetes mellitus ([place])       Right foot pain         -Patient seen and evaluated -Cleansed ulceration  and applied protective dressing of prisma  -Advised patient to do the same every other day  -Advised patient to continue with post-op shoe on right foot and weightbearing to heel only -Advised patient to limit activity to necessity  -Advised patient to ice and elevate as necessary -Awaiting diabetic shoes, patient was fitted/measured by Benjie Karvonen at today's visit  -Will plan for continued wound care at next office visit and if remains healed will plan to keep patient in post op shoe until diabetic shoes with custom insoles are recieved. In the meantime, patient to call office if any issues or problems arise.   [name], DPM

## 2017-10-26 NOTE — Progress Notes (Signed)
Patient ID: Aaron Mclean, male   DOB: Feb 03, 1951, 66 y.o.   MRN: [id number]   Patient presents to be measured for diabetic shoes and inserts with [employer].  Patient measured at 11 medium on the brannock device.  Patient has chosen [employer].  Prior authorization with [employer] requested.  We will call when shoes and inserts arrive.

## 2017-10-29 DIAGNOSIS — I10 Essential (primary) hypertension: Secondary | ICD-10-CM | POA: Diagnosis not present

## 2017-10-29 DIAGNOSIS — R0602 Shortness of breath: Secondary | ICD-10-CM | POA: Diagnosis not present

## 2017-10-29 DIAGNOSIS — R9431 Abnormal electrocardiogram [ECG] [EKG]: Secondary | ICD-10-CM | POA: Diagnosis not present

## 2017-10-29 DIAGNOSIS — I517 Cardiomegaly: Secondary | ICD-10-CM | POA: Diagnosis not present

## 2017-10-29 DIAGNOSIS — R9439 Abnormal result of other cardiovascular function study: Secondary | ICD-10-CM | POA: Diagnosis not present

## 2017-11-15 ENCOUNTER — Encounter: Payer: Self-pay | Admitting: Sports Medicine

## 2017-11-15 ENCOUNTER — Ambulatory Visit (INDEPENDENT_AMBULATORY_CARE_PROVIDER_SITE_OTHER): Payer: PPO | Admitting: Sports Medicine

## 2017-11-15 DIAGNOSIS — Z9889 Other specified postprocedural states: Secondary | ICD-10-CM

## 2017-11-15 DIAGNOSIS — M79671 Pain in right foot: Secondary | ICD-10-CM | POA: Diagnosis not present

## 2017-11-15 DIAGNOSIS — E1142 Type 2 diabetes mellitus with diabetic polyneuropathy: Secondary | ICD-10-CM

## 2017-11-15 DIAGNOSIS — E08621 Diabetes mellitus due to underlying condition with foot ulcer: Secondary | ICD-10-CM

## 2017-11-15 DIAGNOSIS — L97511 Non-pressure chronic ulcer of other part of right foot limited to breakdown of skin: Secondary | ICD-10-CM | POA: Diagnosis not present

## 2017-11-15 DIAGNOSIS — Z89421 Acquired absence of other right toe(s): Secondary | ICD-10-CM | POA: Diagnosis not present

## 2017-11-15 NOTE — Progress Notes (Signed)
Subjective: Aaron Mclean is a 66 y.o. male patient seen today in office for POV #13 (DOS 07-02-17), S/P Right removal of bone/exostectomy with wound debridement with placement of Integra graft. Patient denies current pain at surgical site, Reports that he is changing outer dressing himself every other day and as needed is apply Prisma with no issues. Patient denies calf pain, denies headache, chest pain, shortness of breath, nausea, vomiting, fever, or chills.   Patient Active Problem List   Diagnosis Date Noted  . Benign prostatic hyperplasia with urinary obstruction 09/20/2015  . Family history of malignant neoplasm of prostate 09/20/2015  . Excessive urination at night 09/20/2015    Current Outpatient Medications on File Prior to Visit  Medication Sig Dispense Refill  . acetaminophen (TYLENOL) 325 MG tablet Take 650 mg by mouth 3 (three) times daily as needed for moderate pain or headache.    Marland Kitchen aspirin EC 81 MG tablet Take 81 mg by mouth daily.     Marland Kitchen docusate sodium (COLACE) 100 MG capsule Take 1 capsule (100 mg total) by mouth 2 (two) times daily. 10 capsule 0  . doxycycline (VIBRA-TABS) 100 MG tablet Take 1 tablet (100 mg total) by mouth 2 (two) times daily. 20 tablet 0  . gabapentin (NEURONTIN) 300 MG capsule Take 300 mg by mouth 3 (three) times daily as needed (neuropathy).    Marland Kitchen ibuprofen (ADVIL,MOTRIN) 200 MG tablet Take 400 mg by mouth every 6 (six) hours as needed for headache or moderate pain.    Marland Kitchen LEVEMIR FLEXTOUCH 100 UNIT/ML Pen Inject 5-20 Units into the skin at bedtime as needed (high blood sugar).     Marland Kitchen losartan-hydrochlorothiazide (HYZAAR) 100-25 MG tablet Take 1 tablet by mouth daily.     . metroNIDAZOLE (FLAGYL) 500 MG tablet Take 500 mg by mouth 3 (three) times daily.    . Multiple Vitamin (MULTIVITAMIN WITH MINERALS) TABS tablet Take 1 tablet by mouth 2 (two) times a week.    . nebivolol (BYSTOLIC) 10 MG tablet Take 10 mg by mouth daily.    . promethazine (PHENERGAN) 12.5  MG tablet Take 1 tablet (12.5 mg total) by mouth every 6 (six) hours as needed for nausea or vomiting. 30 tablet 0  . spironolactone (ALDACTONE) 25 MG tablet Take 25 mg by mouth daily.     No current facility-administered medications on file prior to visit.     Allergies  Allergen Reactions  . No Known Allergies     Objective: There were no vitals filed for this visit.  General: No acute distress, AAOx3  Right foot: Graft incorporated. Wound healed over with no surrounding maceration, no erythema, no warmth, no drainage, no other acute signs of infection noted, Capillary fill time <3 seconds in all remaining digits, s/p previous digital amputation with foot deformity, gross sensation present via light touch to right foot, protective absent. No pain with calf compression.   Assessment and Plan:  Problem List Items Addressed This Visit    None    Visit Diagnoses    Diabetic ulcer of other part of right foot associated with diabetes mellitus due to underlying condition, limited to breakdown of skin ([place])    -  Primary   Healed   Diabetic polyneuropathy associated with type 2 diabetes mellitus ([place])       Status post amputation of toe of right foot ([place])       S/P foot surgery, right       Right foot pain         -  Patient seen and evaluated -Cleansed area and applied protective bandaid dressing  -Advised patient to do the same every day while in post op shoes until diabetic shoes arrive  -Advised patient to limit activity to necessity  -Advised patient to ice and elevate as necessary -Awaiting diabetic shoes, patient was already fitted/measured by Benjie Karvonen last month and was given paperwork to take to his doctor to have signed for the shoes  -Will plan for final healed wound check at next office. In the meantime, patient to call office if any issues or problems arise.   [name], DPM

## 2017-11-29 ENCOUNTER — Ambulatory Visit (INDEPENDENT_AMBULATORY_CARE_PROVIDER_SITE_OTHER): Payer: PPO | Admitting: *Deleted

## 2017-11-29 DIAGNOSIS — L97511 Non-pressure chronic ulcer of other part of right foot limited to breakdown of skin: Secondary | ICD-10-CM

## 2017-11-29 DIAGNOSIS — E1142 Type 2 diabetes mellitus with diabetic polyneuropathy: Secondary | ICD-10-CM | POA: Diagnosis not present

## 2017-11-29 DIAGNOSIS — Z899 Acquired absence of limb, unspecified: Secondary | ICD-10-CM

## 2017-11-29 DIAGNOSIS — E08621 Diabetes mellitus due to underlying condition with foot ulcer: Secondary | ICD-10-CM

## 2017-11-29 NOTE — Patient Instructions (Signed)

## 2017-12-10 NOTE — Progress Notes (Signed)
Patient ID: Aaron Mclean, male   DOB: October 23, 1951, 66 y.o.   MRN: [id number]    Patient presents for diabetic shoe pick up, shoes are tried on for good fit.  Patient received 1 pair [employer]pex [id number] Athletic walker black double strap in men's 11 medium and 3 pairs custom molded diabetic inserts.  Verbal and written break in and wear instructions given.  Patient will follow up for scheduled routine care.

## 2017-12-12 ENCOUNTER — Ambulatory Visit: Payer: PPO | Admitting: Sports Medicine

## 2017-12-12 ENCOUNTER — Encounter: Payer: Self-pay | Admitting: Sports Medicine

## 2017-12-12 DIAGNOSIS — L97511 Non-pressure chronic ulcer of other part of right foot limited to breakdown of skin: Secondary | ICD-10-CM

## 2017-12-12 DIAGNOSIS — E08621 Diabetes mellitus due to underlying condition with foot ulcer: Secondary | ICD-10-CM | POA: Diagnosis not present

## 2017-12-12 DIAGNOSIS — E1142 Type 2 diabetes mellitus with diabetic polyneuropathy: Secondary | ICD-10-CM

## 2017-12-12 DIAGNOSIS — Z899 Acquired absence of limb, unspecified: Secondary | ICD-10-CM | POA: Diagnosis not present

## 2017-12-12 NOTE — Progress Notes (Signed)
Subjective: Aaron Mclean is a 67 y.o. male patient seen today in office for POV #14 (DOS 07-02-17), S/P Right removal of bone/exostectomy with wound debridement with placement of Integra graft. Patient wound has been healed and has been adapting to using diabetic shoes without any issues.  Patient denies current pain at surgical site,  denies calf pain, denies headache, chest pain, shortness of breath, nausea, vomiting, fever, or chills.   Fasting blood sugar not recorded  Patient Active Problem List   Diagnosis Date Noted  . Benign prostatic hyperplasia with urinary obstruction 09/20/2015  . Family history of malignant neoplasm of prostate 09/20/2015  . Excessive urination at night 09/20/2015    Current Outpatient Medications on File Prior to Visit  Medication Sig Dispense Refill  . acetaminophen (TYLENOL) 325 MG tablet Take 650 mg by mouth 3 (three) times daily as needed for moderate pain or headache.    Marland Kitchen aspirin EC 81 MG tablet Take 81 mg by mouth daily.     Marland Kitchen docusate sodium (COLACE) 100 MG capsule Take 1 capsule (100 mg total) by mouth 2 (two) times daily. 10 capsule 0  . doxycycline (VIBRA-TABS) 100 MG tablet Take 1 tablet (100 mg total) by mouth 2 (two) times daily. 20 tablet 0  . gabapentin (NEURONTIN) 300 MG capsule Take 300 mg by mouth 3 (three) times daily as needed (neuropathy).    Marland Kitchen ibuprofen (ADVIL,MOTRIN) 200 MG tablet Take 400 mg by mouth every 6 (six) hours as needed for headache or moderate pain.    Marland Kitchen LEVEMIR FLEXTOUCH 100 UNIT/ML Pen Inject 5-20 Units into the skin at bedtime as needed (high blood sugar).     Marland Kitchen losartan-hydrochlorothiazide (HYZAAR) 100-25 MG tablet Take 1 tablet by mouth daily.     . metroNIDAZOLE (FLAGYL) 500 MG tablet Take 500 mg by mouth 3 (three) times daily.    . Multiple Vitamin (MULTIVITAMIN WITH MINERALS) TABS tablet Take 1 tablet by mouth 2 (two) times a week.    . nebivolol (BYSTOLIC) 10 MG tablet Take 10 mg by mouth daily.    . promethazine  (PHENERGAN) 12.5 MG tablet Take 1 tablet (12.5 mg total) by mouth every 6 (six) hours as needed for nausea or vomiting. 30 tablet 0  . spironolactone (ALDACTONE) 25 MG tablet Take 25 mg by mouth daily.     No current facility-administered medications on file prior to visit.     Allergies  Allergen Reactions  . No Known Allergies     Objective: There were no vitals filed for this visit.  General: No acute distress, AAOx3  Right foot: Right plantar and lateral foot wound well-healed with no surrounding maceration, no erythema, no warmth, no drainage, no other acute signs of infection noted, Capillary fill time <3 seconds in all remaining digits, s/p previous digital amputation with foot deformity, gross sensation present via light touch to right foot, protective absent. No pain with calf compression.   Diabetic shoes fit appropriately  Assessment and Plan:  Problem List Items Addressed This Visit    None    Visit Diagnoses    Diabetic ulcer of other part of right foot associated with diabetes mellitus due to underlying condition, limited to breakdown of skin ([place])    -  Primary   Healed   Diabetic polyneuropathy associated with type 2 diabetes mellitus ([place])       Hx of amputation         -Patient seen and evaluated -No dressings are needed since area  is well healed at right foot -Continue with diabetic shoes -Advised patient to limit activity to tolerance -Will plan for final healed wound check at next office. In the meantime, patient to call office if any issues or problems arise.  Advised patient that after the his next checkup will need to resume a schedule of every 3 months for diabetic foot care.  [name], DPM

## 2017-12-13 DIAGNOSIS — E1159 Type 2 diabetes mellitus with other circulatory complications: Secondary | ICD-10-CM | POA: Diagnosis not present

## 2017-12-13 DIAGNOSIS — E785 Hyperlipidemia, unspecified: Secondary | ICD-10-CM | POA: Diagnosis not present

## 2017-12-13 DIAGNOSIS — E1122 Type 2 diabetes mellitus with diabetic chronic kidney disease: Secondary | ICD-10-CM | POA: Diagnosis not present

## 2017-12-14 NOTE — Progress Notes (Signed)
Agree with CMA note -Dr. Cannon Kettle

## 2017-12-20 DIAGNOSIS — E1121 Type 2 diabetes mellitus with diabetic nephropathy: Secondary | ICD-10-CM | POA: Diagnosis not present

## 2017-12-20 DIAGNOSIS — E1122 Type 2 diabetes mellitus with diabetic chronic kidney disease: Secondary | ICD-10-CM | POA: Diagnosis not present

## 2017-12-20 DIAGNOSIS — E1159 Type 2 diabetes mellitus with other circulatory complications: Secondary | ICD-10-CM | POA: Diagnosis not present

## 2017-12-20 DIAGNOSIS — N183 Chronic kidney disease, stage 3 (moderate): Secondary | ICD-10-CM | POA: Diagnosis not present

## 2018-01-23 ENCOUNTER — Ambulatory Visit (INDEPENDENT_AMBULATORY_CARE_PROVIDER_SITE_OTHER): Payer: PPO | Admitting: Sports Medicine

## 2018-01-23 ENCOUNTER — Encounter: Payer: Self-pay | Admitting: Sports Medicine

## 2018-01-23 DIAGNOSIS — Z899 Acquired absence of limb, unspecified: Secondary | ICD-10-CM | POA: Diagnosis not present

## 2018-01-23 DIAGNOSIS — K5909 Other constipation: Secondary | ICD-10-CM | POA: Diagnosis not present

## 2018-01-23 DIAGNOSIS — E1142 Type 2 diabetes mellitus with diabetic polyneuropathy: Secondary | ICD-10-CM | POA: Diagnosis not present

## 2018-01-23 DIAGNOSIS — L97511 Non-pressure chronic ulcer of other part of right foot limited to breakdown of skin: Secondary | ICD-10-CM | POA: Diagnosis not present

## 2018-01-23 DIAGNOSIS — Z6823 Body mass index (BMI) 23.0-23.9, adult: Secondary | ICD-10-CM | POA: Diagnosis not present

## 2018-01-23 DIAGNOSIS — Z89421 Acquired absence of other right toe(s): Secondary | ICD-10-CM

## 2018-01-23 DIAGNOSIS — I10 Essential (primary) hypertension: Secondary | ICD-10-CM | POA: Diagnosis not present

## 2018-01-23 DIAGNOSIS — E1159 Type 2 diabetes mellitus with other circulatory complications: Secondary | ICD-10-CM | POA: Diagnosis not present

## 2018-01-23 NOTE — Progress Notes (Signed)
Subjective: Aaron Mclean is a 67 y.o. male patient seen today in office for wound check after getting diabetic shoes. Previous wound is healed but complains of pain and brusing distal to previous wound on right foot. Patient denies calf pain, denies headache, chest pain, shortness of breath, vomiting, fever, or chills. Admits to nausea because hasnt had breakfast  Fasting blood sugar not recorded  Patient Active Problem List   Diagnosis Date Noted  . Benign prostatic hyperplasia with urinary obstruction 09/20/2015  . Family history of malignant neoplasm of prostate 09/20/2015  . Excessive urination at night 09/20/2015    Current Outpatient Medications on File Prior to Visit  Medication Sig Dispense Refill  . acetaminophen (TYLENOL) 325 MG tablet Take 650 mg by mouth 3 (three) times daily as needed for moderate pain or headache.    Marland Kitchen aspirin EC 81 MG tablet Take 81 mg by mouth daily.     Marland Kitchen docusate sodium (COLACE) 100 MG capsule Take 1 capsule (100 mg total) by mouth 2 (two) times daily. 10 capsule 0  . doxycycline (VIBRA-TABS) 100 MG tablet Take 1 tablet (100 mg total) by mouth 2 (two) times daily. 20 tablet 0  . gabapentin (NEURONTIN) 300 MG capsule Take 300 mg by mouth 3 (three) times daily as needed (neuropathy).    Marland Kitchen ibuprofen (ADVIL,MOTRIN) 200 MG tablet Take 400 mg by mouth every 6 (six) hours as needed for headache or moderate pain.    Marland Kitchen LEVEMIR FLEXTOUCH 100 UNIT/ML Pen Inject 5-20 Units into the skin at bedtime as needed (high blood sugar).     Marland Kitchen losartan-hydrochlorothiazide (HYZAAR) 100-25 MG tablet Take 1 tablet by mouth daily.     . metroNIDAZOLE (FLAGYL) 500 MG tablet Take 500 mg by mouth 3 (three) times daily.    . Multiple Vitamin (MULTIVITAMIN WITH MINERALS) TABS tablet Take 1 tablet by mouth 2 (two) times a week.    . nebivolol (BYSTOLIC) 10 MG tablet Take 10 mg by mouth daily.    . promethazine (PHENERGAN) 12.5 MG tablet Take 1 tablet (12.5 mg total) by mouth every 6 (six)  hours as needed for nausea or vomiting. 30 tablet 0  . spironolactone (ALDACTONE) 25 MG tablet Take 25 mg by mouth daily.     No current facility-administered medications on file prior to visit.     Allergies  Allergen Reactions  . No Known Allergies     Objective: There were no vitals filed for this visit.  General: No acute distress, AAOx3  Right foot: Right  Sub met 4 plantar ulceration limited to breakdown of skin that measures 1 x 0.5 cm at lateral and direct plantar aspect of the metatarsal head with no surrounding signs of infection granular base no malodor no active drainage.  Previous lateral foot wound well-healed with no surrounding infection, no erythema, no warmth, no drainage, no other acute signs of infection noted, Capillary fill time <3 seconds in all remaining digits, s/p previous digital amputation with foot deformity, gross sensation present via light touch to right foot, protective absent. No pain with calf compression.   Assessment and Plan:  Problem List Items Addressed This Visit    None    Visit Diagnoses    Foot ulcer, limited to breakdown of skin, right ([place])    -  Primary   Diabetic polyneuropathy associated with type 2 diabetes mellitus ([place])       Hx of amputation       Status post amputation of toe of  right foot ([place])          -Patient seen and evaluated -Discussed with patient wound care for new ulceration at sub-met for on right foot - Excisionally dedbrided ulceration at sub-met for on right to healthy bleeding borders removing nonviable tissue using a sterile chisel blade. Wound measures post debridement as above.  Wound was debrided to the level of the dermis with viable wound base exposed to promote healing. Hemostasis was achieved with manuel pressure. Patient tolerated procedure well without any discomfort or anesthesia necessary for this wound debridement.  -Applied Betadine and Band-Aid sterile dressing and instructed patient to continue with  daily dressings at home consisting of the same - Advised patient to go to the ER or return to office if the wound worsens or if constitutional symptoms are present. -Continue with diabetic shoes; offloading padding and cut out was applied on right insole of diabetic shoe this visit  -Advised patient to limit activity to tolerance -Will plan for wound check/care at next office. In the meantime, patient to call office if any issues or problems arise.    [name], DPM

## 2018-01-30 DIAGNOSIS — K5909 Other constipation: Secondary | ICD-10-CM | POA: Diagnosis not present

## 2018-01-30 DIAGNOSIS — Z139 Encounter for screening, unspecified: Secondary | ICD-10-CM | POA: Diagnosis not present

## 2018-01-30 DIAGNOSIS — I5030 Unspecified diastolic (congestive) heart failure: Secondary | ICD-10-CM | POA: Diagnosis not present

## 2018-01-30 DIAGNOSIS — Z1331 Encounter for screening for depression: Secondary | ICD-10-CM | POA: Diagnosis not present

## 2018-01-30 DIAGNOSIS — Z0000 Encounter for general adult medical examination without abnormal findings: Secondary | ICD-10-CM | POA: Diagnosis not present

## 2018-02-13 ENCOUNTER — Ambulatory Visit: Payer: PPO | Admitting: Sports Medicine

## 2018-02-13 DIAGNOSIS — G3184 Mild cognitive impairment, so stated: Secondary | ICD-10-CM | POA: Diagnosis not present

## 2018-02-13 DIAGNOSIS — E1159 Type 2 diabetes mellitus with other circulatory complications: Secondary | ICD-10-CM | POA: Diagnosis not present

## 2018-02-13 DIAGNOSIS — Z6822 Body mass index (BMI) 22.0-22.9, adult: Secondary | ICD-10-CM | POA: Diagnosis not present

## 2018-02-13 DIAGNOSIS — F99 Mental disorder, not otherwise specified: Secondary | ICD-10-CM | POA: Diagnosis not present

## 2018-02-13 DIAGNOSIS — I10 Essential (primary) hypertension: Secondary | ICD-10-CM | POA: Diagnosis not present

## 2018-02-19 DIAGNOSIS — F99 Mental disorder, not otherwise specified: Secondary | ICD-10-CM | POA: Diagnosis not present

## 2018-02-19 DIAGNOSIS — Z6823 Body mass index (BMI) 23.0-23.9, adult: Secondary | ICD-10-CM | POA: Diagnosis not present

## 2018-02-19 DIAGNOSIS — R5383 Other fatigue: Secondary | ICD-10-CM | POA: Diagnosis not present

## 2018-03-06 ENCOUNTER — Ambulatory Visit: Payer: PPO | Admitting: Sports Medicine

## 2018-03-06 ENCOUNTER — Encounter: Payer: Self-pay | Admitting: Sports Medicine

## 2018-03-06 DIAGNOSIS — Z899 Acquired absence of limb, unspecified: Secondary | ICD-10-CM

## 2018-03-06 DIAGNOSIS — E1142 Type 2 diabetes mellitus with diabetic polyneuropathy: Secondary | ICD-10-CM

## 2018-03-06 DIAGNOSIS — L97511 Non-pressure chronic ulcer of other part of right foot limited to breakdown of skin: Secondary | ICD-10-CM | POA: Diagnosis not present

## 2018-03-06 NOTE — Progress Notes (Signed)
Subjective: Aaron Mclean is a 67 y.o. male patient seen today in office for right foot wound care. Reports that he has not dressed it every day, sometimes uses iodosorb and has noticed bloody drainage. Patient states pain is 3/10.  Patient denies new calf pain, denies headache, chest pain, shortness of breath, vomiting, fever, or chills.  Fasting blood sugar not recorded  Patient Active Problem List   Diagnosis Date Noted  . Benign prostatic hyperplasia with urinary obstruction 09/20/2015  . Family history of malignant neoplasm of prostate 09/20/2015  . Excessive urination at night 09/20/2015    Current Outpatient Medications on File Prior to Visit  Medication Sig Dispense Refill  . acetaminophen (TYLENOL) 325 MG tablet Take 650 mg by mouth 3 (three) times daily as needed for moderate pain or headache.    Marland Kitchen aspirin EC 81 MG tablet Take 81 mg by mouth daily.     Marland Kitchen docusate sodium (COLACE) 100 MG capsule Take 1 capsule (100 mg total) by mouth 2 (two) times daily. 10 capsule 0  . doxycycline (VIBRA-TABS) 100 MG tablet Take 1 tablet (100 mg total) by mouth 2 (two) times daily. 20 tablet 0  . gabapentin (NEURONTIN) 300 MG capsule Take 300 mg by mouth 3 (three) times daily as needed (neuropathy).    Marland Kitchen ibuprofen (ADVIL,MOTRIN) 200 MG tablet Take 400 mg by mouth every 6 (six) hours as needed for headache or moderate pain.    Marland Kitchen LEVEMIR FLEXTOUCH 100 UNIT/ML Pen Inject 5-20 Units into the skin at bedtime as needed (high blood sugar).     Marland Kitchen losartan-hydrochlorothiazide (HYZAAR) 100-25 MG tablet Take 1 tablet by mouth daily.     . metroNIDAZOLE (FLAGYL) 500 MG tablet Take 500 mg by mouth 3 (three) times daily.    . Multiple Vitamin (MULTIVITAMIN WITH MINERALS) TABS tablet Take 1 tablet by mouth 2 (two) times a week.    . nebivolol (BYSTOLIC) 10 MG tablet Take 10 mg by mouth daily.    . promethazine (PHENERGAN) 12.5 MG tablet Take 1 tablet (12.5 mg total) by mouth every 6 (six) hours as needed for nausea  or vomiting. 30 tablet 0  . spironolactone (ALDACTONE) 25 MG tablet Take 25 mg by mouth daily.     No current facility-administered medications on file prior to visit.     Allergies  Allergen Reactions  . No Known Allergies     Objective: There were no vitals filed for this visit.  General: No acute distress, AAOx3  Right foot: Right  Sub met 4 plantar ulceration limited to breakdown of skin that measures 1 x 0.5 cm (same as previous) at plantar aspect of the metatarsal head with no surrounding signs of infection, granular base, no malodor, no active drainage, no erythema, no warmth, no drainage, no other acute signs of infection noted, Capillary fill time <3 seconds in all remaining digits, s/p previous digital amputation with foot deformity, gross sensation present via light touch to right foot, protective absent. No pain with calf compression.   Assessment and Plan:  Problem List Items Addressed This Visit    None    Visit Diagnoses    Foot ulcer, limited to breakdown of skin, right ([place])    -  Primary   Diabetic polyneuropathy associated with type 2 diabetes mellitus ([place])       Hx of amputation          -Patient seen and evaluated -Discussed with patient wound care for new ulceration at sub-met for  on right foot -Encouraged patient to be compliant with daily dressing changes and offloading padding; there was evidence of soft clinic in the wound and evidence that the foot has not been dressed in several days - Excisionally dedbrided ulceration at sub-met for on right to healthy bleeding borders removing nonviable tissue using a sterile chisel blade. Wound measures post debridement as above.  Wound was debrided to the level of the dermis with viable wound base exposed to promote healing. Hemostasis was achieved with manuel pressure. Patient tolerated procedure well without any discomfort or anesthesia necessary for this wound debridement.  -Applied medihoney and dry sterile  dressing and instructed patient to continue with daily dressings at home consisting of Iodosorb - Advised patient to go to the ER or return to office if the wound worsens or if constitutional symptoms are present. -Continue with diabetic shoes with offloading padding -Advised patient to limit activity to tolerance -Will plan for wound check/care at next office. In the meantime, patient to call office if any issues or problems arise.    [name], DPM

## 2018-03-12 DIAGNOSIS — Z789 Other specified health status: Secondary | ICD-10-CM | POA: Diagnosis not present

## 2018-03-12 DIAGNOSIS — R5383 Other fatigue: Secondary | ICD-10-CM | POA: Diagnosis not present

## 2018-03-12 DIAGNOSIS — Z6824 Body mass index (BMI) 24.0-24.9, adult: Secondary | ICD-10-CM | POA: Diagnosis not present

## 2018-03-27 ENCOUNTER — Ambulatory Visit: Payer: PPO | Admitting: Sports Medicine

## 2018-03-27 ENCOUNTER — Encounter: Payer: Self-pay | Admitting: Sports Medicine

## 2018-03-27 DIAGNOSIS — E1142 Type 2 diabetes mellitus with diabetic polyneuropathy: Secondary | ICD-10-CM

## 2018-03-27 DIAGNOSIS — L97511 Non-pressure chronic ulcer of other part of right foot limited to breakdown of skin: Secondary | ICD-10-CM

## 2018-03-27 DIAGNOSIS — Z89421 Acquired absence of other right toe(s): Secondary | ICD-10-CM

## 2018-03-27 DIAGNOSIS — M79671 Pain in right foot: Secondary | ICD-10-CM | POA: Diagnosis not present

## 2018-03-27 DIAGNOSIS — Z899 Acquired absence of limb, unspecified: Secondary | ICD-10-CM

## 2018-03-27 NOTE — Progress Notes (Signed)
Subjective: Aaron Mclean is a 67 y.o. male patient seen today in office for right foot wound care. Reports that he has not dressed it with the iodine today because he wanted me to see what it looks like before putting medicine on it. Patient states pain is 5/10 when in shoes feels like his shoes sometimes rub her feet too tight.  Patient denies new calf pain, denies headache, chest pain, shortness of breath, vomiting, fever, or chills.  Fasting blood sugar not recorded  Patient Active Problem List   Diagnosis Date Noted  . Benign prostatic hyperplasia with urinary obstruction 09/20/2015  . Family history of malignant neoplasm of prostate 09/20/2015  . Excessive urination at night 09/20/2015    Current Outpatient Medications on File Prior to Visit  Medication Sig Dispense Refill  . acetaminophen (TYLENOL) 325 MG tablet Take 650 mg by mouth 3 (three) times daily as needed for moderate pain or headache.    Marland Kitchen aspirin EC 81 MG tablet Take 81 mg by mouth daily.     Marland Kitchen docusate sodium (COLACE) 100 MG capsule Take 1 capsule (100 mg total) by mouth 2 (two) times daily. 10 capsule 0  . doxycycline (VIBRA-TABS) 100 MG tablet Take 1 tablet (100 mg total) by mouth 2 (two) times daily. 20 tablet 0  . gabapentin (NEURONTIN) 300 MG capsule Take 300 mg by mouth 3 (three) times daily as needed (neuropathy).    Marland Kitchen ibuprofen (ADVIL,MOTRIN) 200 MG tablet Take 400 mg by mouth every 6 (six) hours as needed for headache or moderate pain.    Marland Kitchen LEVEMIR FLEXTOUCH 100 UNIT/ML Pen Inject 5-20 Units into the skin at bedtime as needed (high blood sugar).     Marland Kitchen losartan-hydrochlorothiazide (HYZAAR) 100-25 MG tablet Take 1 tablet by mouth daily.     . metroNIDAZOLE (FLAGYL) 500 MG tablet Take 500 mg by mouth 3 (three) times daily.    . Multiple Vitamin (MULTIVITAMIN WITH MINERALS) TABS tablet Take 1 tablet by mouth 2 (two) times a week.    . nebivolol (BYSTOLIC) 10 MG tablet Take 10 mg by mouth daily.    . promethazine  (PHENERGAN) 12.5 MG tablet Take 1 tablet (12.5 mg total) by mouth every 6 (six) hours as needed for nausea or vomiting. 30 tablet 0  . spironolactone (ALDACTONE) 25 MG tablet Take 25 mg by mouth daily.     No current facility-administered medications on file prior to visit.     Allergies  Allergen Reactions  . No Known Allergies     Objective: There were no vitals filed for this visit.  General: No acute distress, AAOx3  Right foot: Right  Sub met 4 plantar ulceration limited to breakdown of skin that measures 0.5 x 0.3 cm (smaller than previous) at plantar aspect of the metatarsal head with no surrounding signs of infection, granular base, no malodor, no active drainage, no erythema, no warmth, no drainage, no other acute signs of infection noted, Capillary fill time <3 seconds in all remaining digits, s/p previous digital amputation with foot deformity, gross sensation present via light touch to right foot, protective absent. No pain with calf compression.   Assessment and Plan:  Problem List Items Addressed This Visit    None    Visit Diagnoses    Foot ulcer, limited to breakdown of skin, right ([place])    -  Primary   Diabetic polyneuropathy associated with type 2 diabetes mellitus ([place])       Hx of amputation  Status post amputation of toe of right foot ([facility])       Right foot pain          -Patient seen and evaluated -Re-Discussed with patient wound care for ulceration at sub-met for on right foot -Encouraged patient to be compliant with daily dressing changes and offloading padding and advised patient to refrain from using too much gauze wrap and wearing a tennis shoe because this will make the shoe fit tighter - Excisionally dedbrided ulceration at sub-met for on right to healthy bleeding borders removing nonviable tissue using a sterile chisel blade. Wound measures post debridement as above.  Wound was debrided to the level of the dermis with viable wound base exposed to  promote healing. Hemostasis was achieved with manuel pressure. Patient tolerated procedure well without any discomfort or anesthesia necessary for this wound debridement.  -Applied medihoney and dry sterile dressing and instructed patient to continue with daily dressings at home consisting of Iodosorb/iodine of which she has at home - Advised patient to go to the ER or return to office if the wound worsens or if constitutional symptoms are present. -Continue with diabetic shoes with offloading padding; replaced padding today in office on shoe -Advised patient to limit activity to tolerance -Will plan for continued wound care at next office. In the meantime, patient to call office if any issues or problems arise.    [name], DPM

## 2018-04-02 DIAGNOSIS — Z6824 Body mass index (BMI) 24.0-24.9, adult: Secondary | ICD-10-CM | POA: Diagnosis not present

## 2018-04-02 DIAGNOSIS — E1159 Type 2 diabetes mellitus with other circulatory complications: Secondary | ICD-10-CM | POA: Diagnosis not present

## 2018-04-02 DIAGNOSIS — I10 Essential (primary) hypertension: Secondary | ICD-10-CM | POA: Diagnosis not present

## 2018-04-02 DIAGNOSIS — R5383 Other fatigue: Secondary | ICD-10-CM | POA: Diagnosis not present

## 2018-04-17 ENCOUNTER — Encounter: Payer: Self-pay | Admitting: Sports Medicine

## 2018-04-17 ENCOUNTER — Ambulatory Visit: Payer: PPO | Admitting: Sports Medicine

## 2018-04-17 DIAGNOSIS — M79671 Pain in right foot: Secondary | ICD-10-CM

## 2018-04-17 DIAGNOSIS — L97511 Non-pressure chronic ulcer of other part of right foot limited to breakdown of skin: Secondary | ICD-10-CM | POA: Diagnosis not present

## 2018-04-17 DIAGNOSIS — E1142 Type 2 diabetes mellitus with diabetic polyneuropathy: Secondary | ICD-10-CM

## 2018-04-17 DIAGNOSIS — Z899 Acquired absence of limb, unspecified: Secondary | ICD-10-CM

## 2018-04-17 DIAGNOSIS — Z89421 Acquired absence of other right toe(s): Secondary | ICD-10-CM

## 2018-04-17 NOTE — Progress Notes (Signed)
Subjective: Aaron Mclean is a 67 y.o. male patient seen today in office for right foot wound care. Reports that he has dressed it some with iodine however reports that there is a small amount of drainage that gets stuck onto the socks and reports a little bit of pain to the right foot especially when he attempts to wear his diabetic shoe states that his shoes that are bigger and wider are more comfortable so he has stopped wearing his diabetic shoes because of how tight they feel.  Patient denies new calf pain, denies headache, chest pain, shortness of breath, vomiting, fever, or chills.  Fasting blood sugar not recorded  Patient Active Problem List   Diagnosis Date Noted  . Benign prostatic hyperplasia with urinary obstruction 09/20/2015  . Family history of malignant neoplasm of prostate 09/20/2015  . Excessive urination at night 09/20/2015    Current Outpatient Medications on File Prior to Visit  Medication Sig Dispense Refill  . acetaminophen (TYLENOL) 325 MG tablet Take 650 mg by mouth 3 (three) times daily as needed for moderate pain or headache.    . aspirin EC 81 MG tablet Take 81 mg by mouth daily.     . docusate sodium (COLACE) 100 MG capsule Take 1 capsule (100 mg total) by mouth 2 (two) times daily. 10 capsule 0  . doxycycline (VIBRA-TABS) 100 MG tablet Take 1 tablet (100 mg total) by mouth 2 (two) times daily. 20 tablet 0  . gabapentin (NEURONTIN) 300 MG capsule Take 300 mg by mouth 3 (three) times daily as needed (neuropathy).    . ibuprofen (ADVIL,MOTRIN) 200 MG tablet Take 400 mg by mouth every 6 (six) hours as needed for headache or moderate pain.    . LEVEMIR FLEXTOUCH 100 UNIT/ML Pen Inject 5-20 Units into the skin at bedtime as needed (high blood sugar).     . losartan-hydrochlorothiazide (HYZAAR) 100-25 MG tablet Take 1 tablet by mouth daily.     . metroNIDAZOLE (FLAGYL) 500 MG tablet Take 500 mg by mouth 3 (three) times daily.    . Multiple Vitamin (MULTIVITAMIN WITH  MINERALS) TABS tablet Take 1 tablet by mouth 2 (two) times a week.    . nebivolol (BYSTOLIC) 10 MG tablet Take 10 mg by mouth daily.    . promethazine (PHENERGAN) 12.5 MG tablet Take 1 tablet (12.5 mg total) by mouth every 6 (six) hours as needed for nausea or vomiting. 30 tablet 0  . spironolactone (ALDACTONE) 25 MG tablet Take 25 mg by mouth daily.     No current facility-administered medications on file prior to visit.     Allergies  Allergen Reactions  . No Known Allergies     Objective: There were no vitals filed for this visit.  General: No acute distress, AAOx3  Right foot: Right  Sub met 4 plantar ulceration limited to breakdown of skin that measures 0.5 x 0.2 cm (smaller than previous) at plantar aspect of the metatarsal head with no surrounding signs of infection, granular base, no malodor, no active drainage, no erythema, no warmth, no drainage, no other acute signs of infection noted, Capillary fill time <3 seconds in all remaining digits, s/p previous digital amputation with foot deformity, gross sensation present via light touch to right foot, protective absent. No pain with calf compression.   Assessment and Plan:  Problem List Items Addressed This Visit    None    Visit Diagnoses    Foot ulcer, limited to breakdown of skin, right ([facility])    -    Primary   Diabetic polyneuropathy associated with type 2 diabetes mellitus ([facility])       Hx of amputation       Status post amputation of toe of right foot ([facility])       Right foot pain          -Patient seen and evaluated -Re-Discussed with patient wound care for ulceration at sub-met for on right foot -Encouraged patient again to be compliant with daily dressing changes and offloading padding  - Excisionally dedbrided ulceration at sub-met for on right to healthy bleeding borders removing nonviable tissue using a sterile chisel blade. Wound measures post debridement as above.  Wound was debrided to the level of the dermis with  viable wound base exposed to promote healing. Hemostasis was achieved with manuel pressure. Patient tolerated procedure well without any discomfort or anesthesia necessary for this wound debridement.  -Applied medihoney and dry sterile dressing and instructed patient to continue with daily dressings at home consisting of Iodosorb/iodine of which he has at home - Advised patient to go to the ER or return to office if the wound worsens or if constitutional symptoms are present. -Reports that his diabetic shoes are uncomfortable.  Will have patient to return to office to see [facility] for diabetic shoe check and further offload or pad the ulcerated area or send shoes to be stretch -Advised patient to limit activity to tolerance -Will plan for continued wound care at next office. In the meantime, patient to call office if any issues or problems arise.    [name], DPM

## 2018-04-23 DIAGNOSIS — Z125 Encounter for screening for malignant neoplasm of prostate: Secondary | ICD-10-CM | POA: Diagnosis not present

## 2018-04-23 DIAGNOSIS — E1169 Type 2 diabetes mellitus with other specified complication: Secondary | ICD-10-CM | POA: Diagnosis not present

## 2018-04-23 DIAGNOSIS — E1122 Type 2 diabetes mellitus with diabetic chronic kidney disease: Secondary | ICD-10-CM | POA: Diagnosis not present

## 2018-04-23 DIAGNOSIS — N183 Chronic kidney disease, stage 3 (moderate): Secondary | ICD-10-CM | POA: Diagnosis not present

## 2018-04-23 DIAGNOSIS — E1159 Type 2 diabetes mellitus with other circulatory complications: Secondary | ICD-10-CM | POA: Diagnosis not present

## 2018-04-23 DIAGNOSIS — E785 Hyperlipidemia, unspecified: Secondary | ICD-10-CM | POA: Diagnosis not present

## 2018-04-23 DIAGNOSIS — I10 Essential (primary) hypertension: Secondary | ICD-10-CM | POA: Diagnosis not present

## 2018-04-25 ENCOUNTER — Other Ambulatory Visit: Payer: PPO

## 2018-05-10 DIAGNOSIS — N183 Chronic kidney disease, stage 3 (moderate): Secondary | ICD-10-CM | POA: Diagnosis not present

## 2018-05-10 DIAGNOSIS — E1122 Type 2 diabetes mellitus with diabetic chronic kidney disease: Secondary | ICD-10-CM | POA: Diagnosis not present

## 2018-06-09 DIAGNOSIS — I10 Essential (primary) hypertension: Secondary | ICD-10-CM | POA: Diagnosis not present

## 2018-06-09 DIAGNOSIS — E1159 Type 2 diabetes mellitus with other circulatory complications: Secondary | ICD-10-CM | POA: Diagnosis not present

## 2018-06-09 DIAGNOSIS — E1122 Type 2 diabetes mellitus with diabetic chronic kidney disease: Secondary | ICD-10-CM | POA: Diagnosis not present

## 2018-06-09 DIAGNOSIS — N183 Chronic kidney disease, stage 3 (moderate): Secondary | ICD-10-CM | POA: Diagnosis not present

## 2018-06-26 ENCOUNTER — Encounter: Payer: Self-pay | Admitting: Sports Medicine

## 2018-06-26 ENCOUNTER — Ambulatory Visit: Payer: PPO | Admitting: Sports Medicine

## 2018-06-26 VITALS — BP 130/80 | HR 90 | Temp 98.7°F | Resp 16

## 2018-06-26 DIAGNOSIS — E1142 Type 2 diabetes mellitus with diabetic polyneuropathy: Secondary | ICD-10-CM

## 2018-06-26 DIAGNOSIS — L97512 Non-pressure chronic ulcer of other part of right foot with fat layer exposed: Secondary | ICD-10-CM | POA: Diagnosis not present

## 2018-06-26 DIAGNOSIS — E13621 Other specified diabetes mellitus with foot ulcer: Secondary | ICD-10-CM

## 2018-06-26 DIAGNOSIS — L03119 Cellulitis of unspecified part of limb: Secondary | ICD-10-CM | POA: Diagnosis not present

## 2018-06-26 DIAGNOSIS — Z899 Acquired absence of limb, unspecified: Secondary | ICD-10-CM | POA: Diagnosis not present

## 2018-06-26 DIAGNOSIS — L02619 Cutaneous abscess of unspecified foot: Secondary | ICD-10-CM | POA: Diagnosis not present

## 2018-06-26 DIAGNOSIS — L97511 Non-pressure chronic ulcer of other part of right foot limited to breakdown of skin: Secondary | ICD-10-CM | POA: Diagnosis not present

## 2018-06-26 NOTE — Progress Notes (Signed)
Subjective: Aaron Mclean is a 67 y.o. male patient seen today in office for right foot wound care. Reports that he has been having some sharp pain in the right foot and lower leg states that the sore is about the same with a little bloody drainage has been using iodine denies nausea vomiting fever chills swelling or redness to the area.  Fasting blood sugar not recorded  Patient Active Problem List   Diagnosis Date Noted  . Benign prostatic hyperplasia with urinary obstruction 09/20/2015  . Family history of malignant neoplasm of prostate 09/20/2015  . Excessive urination at night 09/20/2015    Current Outpatient Medications on File Prior to Visit  Medication Sig Dispense Refill  . acetaminophen (TYLENOL) 325 MG tablet Take 650 mg by mouth 3 (three) times daily as needed for moderate pain or headache.    Marland Kitchen aspirin EC 81 MG tablet Take 81 mg by mouth daily.     Marland Kitchen docusate sodium (COLACE) 100 MG capsule Take 1 capsule (100 mg total) by mouth 2 (two) times daily. 10 capsule 0  . doxycycline (VIBRA-TABS) 100 MG tablet Take 1 tablet (100 mg total) by mouth 2 (two) times daily. 20 tablet 0  . gabapentin (NEURONTIN) 300 MG capsule Take 300 mg by mouth 3 (three) times daily as needed (neuropathy).    Marland Kitchen ibuprofen (ADVIL,MOTRIN) 200 MG tablet Take 400 mg by mouth every 6 (six) hours as needed for headache or moderate pain.    Marland Kitchen LEVEMIR FLEXTOUCH 100 UNIT/ML Pen Inject 5-20 Units into the skin at bedtime as needed (high blood sugar).     Marland Kitchen losartan-hydrochlorothiazide (HYZAAR) 100-25 MG tablet Take 1 tablet by mouth daily.     . metroNIDAZOLE (FLAGYL) 500 MG tablet Take 500 mg by mouth 3 (three) times daily.    . Multiple Vitamin (MULTIVITAMIN WITH MINERALS) TABS tablet Take 1 tablet by mouth 2 (two) times a week.    . nebivolol (BYSTOLIC) 10 MG tablet Take 10 mg by mouth daily.    . promethazine (PHENERGAN) 12.5 MG tablet Take 1 tablet (12.5 mg total) by mouth every 6 (six) hours as needed for nausea  or vomiting. 30 tablet 0  . spironolactone (ALDACTONE) 25 MG tablet Take 25 mg by mouth daily.     No current facility-administered medications on file prior to visit.     Allergies  Allergen Reactions  . No Known Allergies     Objective: There were no vitals filed for this visit.  General: No acute distress, AAOx3  Right foot: Right Sub met 4 plantar ulceration measures 1 cm x 0.5 cm x 0.9 cm deep that probes to soft tissue in the range cannot feel bone however concerning for bone infection due to the depth of the wound at plantar aspect of the fourth metatarsal head with no surrounding signs of infection, granular base, no malodor, no active drainage, no erythema, no warmth, no drainage, no other acute signs of infection noted, Capillary fill time <3 seconds in all remaining digits, s/p previous digital amputation with foot deformity, gross sensation present via light touch to right foot, protective absent. No pain with calf compression.   Assessment and Plan:  Problem List Items Addressed This Visit    None    Visit Diagnoses    Diabetic ulcer of other part of right foot associated with diabetes mellitus of other type, with fat layer exposed ([place])    -  Primary   Relevant Orders   WOUND CULTURE  Diabetic polyneuropathy associated with type 2 diabetes mellitus ([facility])       Hx of amputation         -Patient seen and evaluated -Re-Discussed with patient wound care for ulceration at sub-met for on right foot -Encouraged patient again to be compliant with daily dressing changes and offloading padding and care because the wound has now deepened -Wound culture obtained if positive will start patient on oral antibiotics - Excisionally dedbrided ulceration at sub-met for on right to healthy bleeding borders removing nonviable tissue using a sterile chisel blade. Wound measures post debridement as above.  Wound was debrided to the level of the dermis with viable wound base exposed to  promote healing. Hemostasis was achieved with manuel pressure. Patient tolerated procedure well without any discomfort or anesthesia necessary for this wound debridement.  -Applied Prisma and packed the area and dry sterile dressing and instructed patient to continue with daily dressings at home consisting of the same - Advised patient to go to the ER or return to office if the wound worsens or if constitutional symptoms are present. -Will plan for x-ray and continued wound care at next office. In the meantime, patient to call office if any issues or problems arise.    [name], DPM

## 2018-06-28 LAB — WOUND CULTURE

## 2018-07-01 ENCOUNTER — Telehealth: Payer: Self-pay | Admitting: *Deleted

## 2018-07-01 MED ORDER — SULFAMETHOXAZOLE-TRIMETHOPRIM 800-160 MG PO TABS: 1.0000 | ORAL_TABLET | Freq: Two times a day (BID) | ORAL | 0 refills | Status: DC

## 2018-07-01 NOTE — Telephone Encounter (Signed)
Unable to leave a message or speak with pt or mtr, the telephone message stated the call could not be completed as dialed.

## 2018-07-01 NOTE — Telephone Encounter (Signed)
-----   Message from [name], [place] sent at 06/28/2018  8:07 PM EDT ----- Bactrim 800/160 to pharmacy + Culture -Dr. Cannon Kettle

## 2018-07-01 NOTE — Telephone Encounter (Signed)
Unable to leave a message the wireless customer is not available.

## 2018-07-01 NOTE — Telephone Encounter (Signed)
Orders sent to [place].

## 2018-07-10 ENCOUNTER — Encounter: Payer: PPO | Admitting: Sports Medicine

## 2018-07-11 NOTE — Progress Notes (Signed)
Patient was a NO show for 07-10-18 visit This encounter was created in error - please disregard.

## 2018-08-16 ENCOUNTER — Ambulatory Visit (INDEPENDENT_AMBULATORY_CARE_PROVIDER_SITE_OTHER): Payer: PPO

## 2018-08-16 ENCOUNTER — Encounter: Payer: Self-pay | Admitting: Sports Medicine

## 2018-08-16 ENCOUNTER — Ambulatory Visit: Payer: PPO | Admitting: Sports Medicine

## 2018-08-16 VITALS — BP 127/83 | HR 72 | Temp 98.4°F | Resp 15

## 2018-08-16 DIAGNOSIS — Z899 Acquired absence of limb, unspecified: Secondary | ICD-10-CM

## 2018-08-16 DIAGNOSIS — E13621 Other specified diabetes mellitus with foot ulcer: Secondary | ICD-10-CM

## 2018-08-16 DIAGNOSIS — L97512 Non-pressure chronic ulcer of other part of right foot with fat layer exposed: Secondary | ICD-10-CM

## 2018-08-16 DIAGNOSIS — E1142 Type 2 diabetes mellitus with diabetic polyneuropathy: Secondary | ICD-10-CM

## 2018-08-16 DIAGNOSIS — M86171 Other acute osteomyelitis, right ankle and foot: Secondary | ICD-10-CM

## 2018-08-16 MED ORDER — AMOXICILLIN-POT CLAVULANATE 875-125 MG PO TABS: 1.0000 | ORAL_TABLET | Freq: Two times a day (BID) | ORAL | 0 refills | Status: DC

## 2018-08-16 NOTE — Progress Notes (Signed)
Subjective: Aaron Mclean is a 67 y.o. male patient seen today in office for right foot wound care. Reports that he feels like the wound is doing worse and states that he was unable to get to his follow-up visits with [place] and has missed significant time and has not dressing the area because he ran out of dressing supplies.  Patient reports drainage redness and some odor reports that he was nauseous this morning however no longer has nausea.  Denies any other constitutional symptoms at this time.  Fasting blood sugar not recorded  Patient Active Problem List   Diagnosis Date Noted  . Benign prostatic hyperplasia with urinary obstruction 09/20/2015  . Family history of malignant neoplasm of prostate 09/20/2015  . Excessive urination at night 09/20/2015    Current Outpatient Medications on File Prior to Visit  Medication Sig Dispense Refill  . acetaminophen (TYLENOL) 325 MG tablet Take 650 mg by mouth 3 (three) times daily as needed for moderate pain or headache.    Marland Kitchen aspirin EC 81 MG tablet Take 81 mg by mouth daily.     Marland Kitchen docusate sodium (COLACE) 100 MG capsule Take 1 capsule (100 mg total) by mouth 2 (two) times daily. 10 capsule 0  . doxycycline (VIBRA-TABS) 100 MG tablet Take 1 tablet (100 mg total) by mouth 2 (two) times daily. 20 tablet 0  . gabapentin (NEURONTIN) 300 MG capsule Take 300 mg by mouth 3 (three) times daily as needed (neuropathy).    Marland Kitchen ibuprofen (ADVIL,MOTRIN) 200 MG tablet Take 400 mg by mouth every 6 (six) hours as needed for headache or moderate pain.    Marland Kitchen LEVEMIR FLEXTOUCH 100 UNIT/ML Pen Inject 5-20 Units into the skin at bedtime as needed (high blood sugar).     Marland Kitchen losartan-hydrochlorothiazide (HYZAAR) 100-25 MG tablet Take 1 tablet by mouth daily.     . metroNIDAZOLE (FLAGYL) 500 MG tablet Take 500 mg by mouth 3 (three) times daily.    . Multiple Vitamin (MULTIVITAMIN WITH MINERALS) TABS tablet Take 1 tablet by mouth 2 (two) times a week.    . nebivolol (BYSTOLIC) 10 MG  tablet Take 10 mg by mouth daily.    . promethazine (PHENERGAN) 12.5 MG tablet Take 1 tablet (12.5 mg total) by mouth every 6 (six) hours as needed for nausea or vomiting. 30 tablet 0  . spironolactone (ALDACTONE) 25 MG tablet Take 25 mg by mouth daily.    Marland Kitchen sulfamethoxazole-trimethoprim (BACTRIM DS,SEPTRA DS) 800-160 MG tablet Take 1 tablet by mouth 2 (two) times daily. 28 tablet 0   No current facility-administered medications on file prior to visit.     Allergies  Allergen Reactions  . No Known Allergies     Objective: There were no vitals filed for this visit.  General: No acute distress, AAOx3  Right foot: Right Sub met 4 plantar ulceration measures 3 x 3 cm and probes to bone that is now encompassing the third toe with digital swelling, with a granular base, no malodor, clear active drainage, focal erythema, focal warmth, no other acute signs of infection noted, Capillary fill time <3 seconds in all remaining digits, s/p previous digital amputation with foot deformity, gross sensation present via light touch to right foot, protective absent. No pain with calf compression.   X-ray right foot there is mild soft tissue swelling at the right foot with bony destruction consistent with osteomyelitis at the third toe  Assessment and Plan:  Problem List Items Addressed This Visit  None    Visit Diagnoses    Diabetic ulcer of other part of right foot associated with diabetes mellitus of other type, with fat layer exposed ([facility])    -  Primary   Relevant Medications   amoxicillin-clavulanate (AUGMENTIN) 875-125 MG tablet   Other Relevant Orders   DG Foot Complete Right   Hx of amputation       Relevant Orders   DG Foot Complete Right   Acute osteomyelitis of right ankle or foot (HCC)       Relevant Medications   amoxicillin-clavulanate (AUGMENTIN) 875-125 MG tablet   Diabetic polyneuropathy associated with type 2 diabetes mellitus (HCC)       Relevant Orders   DG Foot Complete  Right     -Patient seen and evaluated -Re-Discussed with patient wound care and acute osteomyelitis -Applied Betadine and dry dressing advised patient to the same at home daily -Prescribed Augmentin (past cultures positive for enterococcus) and advised patient if wound worsens over the weekend to go to the hospital to be directly admitted since his outpatient surgery for a transmetatarsal amputation could not be scheduled at today's visit due to the surgical center being closed.  Patient did not want to go to the hospital today because of his dog that he has to care for this opts to try to have this done an outpatient basis. -Patient opt for surgical management. Consent obtained for right transmetatarsal amputation. Pre and Post op course explained. Risks, benefits, alternatives explained. No guarantees given or implied. Surgical booking slip submitted and provided patient with Surgical packet and info for [facility].  -Dispensed surgical shoe for patient to use to assist with keeping pressure off the ulcerated area - Advised patient to go to the ER or return to office if the wound worsens or if constitutional symptoms are not improved. -Will plan for OR inpatient over the weekend if patient goes to the hospital or early next week on a outpatient basis for transmetatarsal amputation. In the meantime, patient to call office if any issues or problems arise.    [name], DPM

## 2018-08-16 NOTE — Patient Instructions (Signed)
Pre-Operative Instructions  Congratulations, you have decided to take an important step towards improving your quality of life.  You can be assured that the doctors and staff at [facility] will be with you every step of the way.  Here are some important things you should know:  1. Plan to be at the surgery center/hospital at least 1 (one) hour prior to your scheduled time, unless otherwise directed by the surgical center/hospital staff.  You must have a responsible adult accompany you, remain during the surgery and drive you home.  Make sure you have directions to the surgical center/hospital to ensure you arrive on time. 2. If you are having surgery at [facility] or [facility] hospitals, you will need a copy of your medical history and physical form from your family physician within one month prior to the date of surgery. We will give you a form for your primary physician to complete.  3. We make every effort to accommodate the date you request for surgery.  However, there are times where surgery dates or times have to be moved.  We will contact you as soon as possible if a change in schedule is required.   4. No aspirin/ibuprofen for one week before surgery.  If you are on aspirin, any non-steroidal anti-inflammatory medications (Mobic, Aleve, Ibuprofen) should not be taken seven (7) days prior to your surgery.  You make take Tylenol for pain prior to surgery.  5. Medications - If you are taking daily heart and blood pressure medications, seizure, reflux, allergy, asthma, anxiety, pain or diabetes medications, make sure you notify the surgery center/hospital before the day of surgery so they can tell you which medications you should take or avoid the day of surgery. 6. No food or drink after midnight the night before surgery unless directed otherwise by surgical center/hospital staff. 7. No alcoholic beverages 24-hours prior to surgery.  No smoking 24-hours prior or 24-hours after  surgery. 8. Wear loose pants or shorts. They should be loose enough to fit over bandages, boots, and casts. 9. Don't wear slip-on shoes. Sneakers are preferred. 10. Bring your boot with you to the surgery center/hospital.  Also bring crutches or a walker if your physician has prescribed it for you.  If you do not have this equipment, it will be provided for you after surgery. 11. If you have not been contacted by the surgery center/hospital by the day before your surgery, call to confirm the date and time of your surgery. 12. Leave-time from work may vary depending on the type of surgery you have.  Appropriate arrangements should be made prior to surgery with your employer. 13. Prescriptions will be provided immediately following surgery by your doctor.  Fill these as soon as possible after surgery and take the medication as directed. Pain medications will not be refilled on weekends and must be approved by the doctor. 14. Remove nail polish on the operative foot and avoid getting pedicures prior to surgery. 15. Wash the night before surgery.  The night before surgery wash the foot and leg well with water and the antibacterial soap provided. Be sure to pay special attention to beneath the toenails and in between the toes.  Wash for at least three (3) minutes. Rinse thoroughly with water and dry well with a towel.  Perform this wash unless told not to do so by your physician.  Enclosed: 1 Ice pack (please put in freezer the night before surgery)   1 Hibiclens skin cleaner     Pre-op instructions  If you have any questions regarding the instructions, please do not hesitate to call our office.  [place]: [address], [place], [place] [address] -- [phone]  [place]: [address] [place], [place] [address] -- [phone]  [place]: [address]  [place], [place] [address] -- [phone]  [place]: [address], [address], [place], [place] [address] -- [phone]  Website:  [email or web address] 

## 2018-08-20 ENCOUNTER — Telehealth: Payer: Self-pay | Admitting: *Deleted

## 2018-08-20 NOTE — Telephone Encounter (Signed)
-----   Message from [name], [place] sent at 08/16/2018  4:49 PM EDT ----- Regarding: Surgery Follow Up  I told him to go to hospital over the weekend if it got bad so I could do the surgery inpatient. If he does not go to hospital this weekend call him Monday to get him scheduled for surgery later in week, possibly Thursday at [place] [facility] if we cant get him in sooner at [facility]. He will have to get H&P form filled out if he goes to [facility].  -Dr. Cannon Kettle

## 2018-08-20 NOTE — Telephone Encounter (Signed)
I faxed the snapshot and insurance information to [place] at [facility] to schedule his surgery.  I also faxed clinicals to [employer] to get authorization for surgery.

## 2018-08-20 NOTE — Telephone Encounter (Addendum)
I attempted to call the patient to see how he was doing.  Dr. Cannon Kettle would like to do his surgery on Thursday if possible.  She said if he states it has gotten worse and has an odor he needs to go to the emergency room immediately.  She said if he's admitted she can perform surgery while he's there.  I called Becky and blocked the time for Thursday.  She's asking for the paperwork immediately because other people are trying to get the time.  I attempted to call the patient again and I tried to call his mother, but we have an invalid number for her.

## 2018-08-21 ENCOUNTER — Telehealth: Payer: Self-pay | Admitting: Sports Medicine

## 2018-08-21 NOTE — Telephone Encounter (Signed)
I attempted to call the patient again with no success.

## 2018-08-21 NOTE — Telephone Encounter (Signed)
Called to check on patient since our office has had a hard time reaching him to discuss scheduling surgery for tomorrow.  Patient did not answer and there is no voicemail set up to leave a message.  I will attempt to reach out to patient again or even make a site visit to check on patient to make sure he is okay since this is abnormal for him not to answer or return phone calls. -Dr. Cannon Kettle

## 2018-08-21 NOTE — Telephone Encounter (Signed)
I attempted to call the patient again, he did not answer.   I called Becky at [facility] and canceled the surgery for tomorrow.

## 2018-08-21 NOTE — Telephone Encounter (Signed)
Thanks. I called him today as well.

## 2018-08-22 ENCOUNTER — Telehealth: Payer: Self-pay | Admitting: *Deleted

## 2018-08-22 NOTE — Telephone Encounter (Signed)
"  I'm calling about Mr. Aaron Mclean.  He said he's supposed to have surgery on Monday."  I don't know anything about this.  I have been trying to get in contact with him, as well as Dr. Cannon Mclean.  "He seems to think he's on."  I will see what I can find out.  Can she do his surgery on Monday?  "Yes, in the afternoon.  You may want to call his friend, Aaron Mclean because he said his phone has been acting up.  His phone number is [phone]."  Okay, thank you.  I will let you know what I find out.  I called Aaron Mclean's number.  He said he lives about six miles away from Aaron Mclean.  I asked him if he could contact Aaron Mclean and have him call me.  He said he would get him to call me.  He said Aaron Mclean had asked him if he could take him for his appointment at the hospital on Monday.  I told him that I was calling regarding that appointment and that I needed Aaron Mclean to call me as soon as possible.

## 2018-08-23 ENCOUNTER — Other Ambulatory Visit: Payer: Self-pay | Admitting: Sports Medicine

## 2018-08-23 DIAGNOSIS — L97512 Non-pressure chronic ulcer of other part of right foot with fat layer exposed: Principal | ICD-10-CM

## 2018-08-23 DIAGNOSIS — M86171 Other acute osteomyelitis, right ankle and foot: Secondary | ICD-10-CM

## 2018-08-23 DIAGNOSIS — E13621 Other specified diabetes mellitus with foot ulcer: Secondary | ICD-10-CM

## 2018-08-23 DIAGNOSIS — Z899 Acquired absence of limb, unspecified: Secondary | ICD-10-CM

## 2018-08-23 DIAGNOSIS — E1142 Type 2 diabetes mellitus with diabetic polyneuropathy: Secondary | ICD-10-CM

## 2018-08-26 NOTE — Telephone Encounter (Signed)
I attempted to call the patient regarding his surgery.  His phone is still stating he's not available.  I also called the surgical center and they informed me that they do not have him scheduled for today.  They asked me to let them know as soon as I get in contact with the patient.

## 2018-08-28 ENCOUNTER — Ambulatory Visit (INDEPENDENT_AMBULATORY_CARE_PROVIDER_SITE_OTHER): Payer: PPO | Admitting: Sports Medicine

## 2018-08-28 ENCOUNTER — Encounter: Payer: Self-pay | Admitting: Sports Medicine

## 2018-08-28 DIAGNOSIS — Z9889 Other specified postprocedural states: Secondary | ICD-10-CM

## 2018-08-28 DIAGNOSIS — L97512 Non-pressure chronic ulcer of other part of right foot with fat layer exposed: Secondary | ICD-10-CM

## 2018-08-28 DIAGNOSIS — Z899 Acquired absence of limb, unspecified: Secondary | ICD-10-CM

## 2018-08-28 DIAGNOSIS — E13621 Other specified diabetes mellitus with foot ulcer: Secondary | ICD-10-CM

## 2018-08-28 NOTE — Progress Notes (Signed)
Patient was a no-show for today's office visit.  Attempted to call patient on his phone number listed in the phone number listed in call number of a friend Melvern Banker in chart of which I also tried at [phone] however there was no answer and the line appeared to be busy.  Our office has been urgently trying to contact patient to arrange amputation procedure due to osteomyelitis however the patient has failed to contact the office for follow-up in order for [place] to schedule the surgery.  -Dr. Cannon Kettle

## 2018-09-06 ENCOUNTER — Encounter: Payer: PPO | Admitting: Sports Medicine

## 2018-09-16 DIAGNOSIS — F22 Delusional disorders: Secondary | ICD-10-CM | POA: Diagnosis not present

## 2018-09-16 DIAGNOSIS — I130 Hypertensive heart and chronic kidney disease with heart failure and stage 1 through stage 4 chronic kidney disease, or unspecified chronic kidney disease: Secondary | ICD-10-CM | POA: Diagnosis not present

## 2018-09-16 DIAGNOSIS — E1122 Type 2 diabetes mellitus with diabetic chronic kidney disease: Secondary | ICD-10-CM | POA: Diagnosis not present

## 2018-09-16 DIAGNOSIS — Z23 Encounter for immunization: Secondary | ICD-10-CM | POA: Diagnosis not present

## 2018-09-16 DIAGNOSIS — N183 Chronic kidney disease, stage 3 (moderate): Secondary | ICD-10-CM | POA: Diagnosis not present

## 2018-09-16 DIAGNOSIS — E1169 Type 2 diabetes mellitus with other specified complication: Secondary | ICD-10-CM | POA: Diagnosis not present

## 2018-11-04 DIAGNOSIS — Z6824 Body mass index (BMI) 24.0-24.9, adult: Secondary | ICD-10-CM | POA: Diagnosis not present

## 2018-11-04 DIAGNOSIS — S80819A Abrasion, unspecified lower leg, initial encounter: Secondary | ICD-10-CM | POA: Diagnosis not present

## 2018-11-04 DIAGNOSIS — F4010 Social phobia, unspecified: Secondary | ICD-10-CM | POA: Diagnosis not present

## 2018-11-04 DIAGNOSIS — I5030 Unspecified diastolic (congestive) heart failure: Secondary | ICD-10-CM | POA: Diagnosis not present

## 2018-12-01 DIAGNOSIS — E1142 Type 2 diabetes mellitus with diabetic polyneuropathy: Secondary | ICD-10-CM | POA: Diagnosis not present

## 2018-12-01 DIAGNOSIS — I340 Nonrheumatic mitral (valve) insufficiency: Secondary | ICD-10-CM | POA: Diagnosis not present

## 2018-12-01 DIAGNOSIS — R0989 Other specified symptoms and signs involving the circulatory and respiratory systems: Secondary | ICD-10-CM | POA: Diagnosis not present

## 2018-12-01 DIAGNOSIS — N189 Chronic kidney disease, unspecified: Secondary | ICD-10-CM | POA: Diagnosis not present

## 2018-12-01 DIAGNOSIS — E1165 Type 2 diabetes mellitus with hyperglycemia: Secondary | ICD-10-CM | POA: Diagnosis not present

## 2018-12-01 DIAGNOSIS — Z87891 Personal history of nicotine dependence: Secondary | ICD-10-CM | POA: Diagnosis not present

## 2018-12-01 DIAGNOSIS — N183 Chronic kidney disease, stage 3 (moderate): Secondary | ICD-10-CM | POA: Diagnosis not present

## 2018-12-01 DIAGNOSIS — I5021 Acute systolic (congestive) heart failure: Secondary | ICD-10-CM | POA: Diagnosis not present

## 2018-12-01 DIAGNOSIS — I509 Heart failure, unspecified: Secondary | ICD-10-CM | POA: Diagnosis not present

## 2018-12-01 DIAGNOSIS — E785 Hyperlipidemia, unspecified: Secondary | ICD-10-CM | POA: Diagnosis not present

## 2018-12-01 DIAGNOSIS — G4733 Obstructive sleep apnea (adult) (pediatric): Secondary | ICD-10-CM | POA: Diagnosis not present

## 2018-12-01 DIAGNOSIS — I350 Nonrheumatic aortic (valve) stenosis: Secondary | ICD-10-CM | POA: Diagnosis not present

## 2018-12-01 DIAGNOSIS — I493 Ventricular premature depolarization: Secondary | ICD-10-CM | POA: Diagnosis not present

## 2018-12-01 DIAGNOSIS — I429 Cardiomyopathy, unspecified: Secondary | ICD-10-CM | POA: Diagnosis not present

## 2018-12-01 DIAGNOSIS — I110 Hypertensive heart disease with heart failure: Secondary | ICD-10-CM | POA: Diagnosis not present

## 2018-12-01 DIAGNOSIS — I130 Hypertensive heart and chronic kidney disease with heart failure and stage 1 through stage 4 chronic kidney disease, or unspecified chronic kidney disease: Secondary | ICD-10-CM | POA: Diagnosis not present

## 2018-12-01 DIAGNOSIS — I10 Essential (primary) hypertension: Secondary | ICD-10-CM | POA: Diagnosis not present

## 2018-12-01 DIAGNOSIS — R0602 Shortness of breath: Secondary | ICD-10-CM | POA: Diagnosis not present

## 2018-12-01 DIAGNOSIS — N179 Acute kidney failure, unspecified: Secondary | ICD-10-CM | POA: Diagnosis not present

## 2018-12-01 DIAGNOSIS — E118 Type 2 diabetes mellitus with unspecified complications: Secondary | ICD-10-CM | POA: Diagnosis not present

## 2018-12-01 DIAGNOSIS — E1122 Type 2 diabetes mellitus with diabetic chronic kidney disease: Secondary | ICD-10-CM | POA: Diagnosis not present

## 2018-12-01 DIAGNOSIS — R0902 Hypoxemia: Secondary | ICD-10-CM | POA: Diagnosis not present

## 2018-12-01 DIAGNOSIS — Z79899 Other long term (current) drug therapy: Secondary | ICD-10-CM | POA: Diagnosis not present

## 2018-12-01 DIAGNOSIS — I491 Atrial premature depolarization: Secondary | ICD-10-CM | POA: Diagnosis not present

## 2018-12-02 ENCOUNTER — Inpatient Hospital Stay (HOSPITAL_COMMUNITY)
Admission: AD | Admit: 2018-12-02 | Discharge: 2018-12-03 | DRG: 286 | Disposition: A | Discharge status: Home / Self Care | Payer: PPO | Source: Other Acute Inpatient Hospital | Attending: Internal Medicine | Admitting: Internal Medicine

## 2018-12-02 ENCOUNTER — Encounter (HOSPITAL_COMMUNITY)
Admission: AD | Disposition: A | Discharge status: Home / Self Care | Payer: Self-pay | Source: Other Acute Inpatient Hospital | Attending: Internal Medicine

## 2018-12-02 DIAGNOSIS — I252 Old myocardial infarction: Secondary | ICD-10-CM | POA: Diagnosis not present

## 2018-12-02 DIAGNOSIS — Z791 Long term (current) use of non-steroidal anti-inflammatories (NSAID): Secondary | ICD-10-CM

## 2018-12-02 DIAGNOSIS — Z809 Family history of malignant neoplasm, unspecified: Secondary | ICD-10-CM | POA: Diagnosis not present

## 2018-12-02 DIAGNOSIS — Z87891 Personal history of nicotine dependence: Secondary | ICD-10-CM

## 2018-12-02 DIAGNOSIS — N183 Chronic kidney disease, stage 3 unspecified: Secondary | ICD-10-CM | POA: Diagnosis present

## 2018-12-02 DIAGNOSIS — K219 Gastro-esophageal reflux disease without esophagitis: Secondary | ICD-10-CM | POA: Diagnosis present

## 2018-12-02 DIAGNOSIS — I2510 Atherosclerotic heart disease of native coronary artery without angina pectoris: Secondary | ICD-10-CM | POA: Diagnosis not present

## 2018-12-02 DIAGNOSIS — Z79899 Other long term (current) drug therapy: Secondary | ICD-10-CM

## 2018-12-02 DIAGNOSIS — Z9119 Patient's noncompliance with other medical treatment and regimen: Secondary | ICD-10-CM

## 2018-12-02 DIAGNOSIS — Z7982 Long term (current) use of aspirin: Secondary | ICD-10-CM | POA: Diagnosis not present

## 2018-12-02 DIAGNOSIS — E1165 Type 2 diabetes mellitus with hyperglycemia: Secondary | ICD-10-CM | POA: Diagnosis present

## 2018-12-02 DIAGNOSIS — N189 Chronic kidney disease, unspecified: Secondary | ICD-10-CM | POA: Diagnosis not present

## 2018-12-02 DIAGNOSIS — I428 Other cardiomyopathies: Secondary | ICD-10-CM | POA: Diagnosis present

## 2018-12-02 DIAGNOSIS — I5021 Acute systolic (congestive) heart failure: Secondary | ICD-10-CM | POA: Diagnosis not present

## 2018-12-02 DIAGNOSIS — Z820 Family history of epilepsy and other diseases of the nervous system: Secondary | ICD-10-CM | POA: Diagnosis not present

## 2018-12-02 DIAGNOSIS — E118 Type 2 diabetes mellitus with unspecified complications: Secondary | ICD-10-CM | POA: Diagnosis not present

## 2018-12-02 DIAGNOSIS — I340 Nonrheumatic mitral (valve) insufficiency: Secondary | ICD-10-CM | POA: Diagnosis present

## 2018-12-02 DIAGNOSIS — I509 Heart failure, unspecified: Secondary | ICD-10-CM | POA: Diagnosis present

## 2018-12-02 DIAGNOSIS — I4891 Unspecified atrial fibrillation: Secondary | ICD-10-CM | POA: Diagnosis not present

## 2018-12-02 DIAGNOSIS — G4733 Obstructive sleep apnea (adult) (pediatric): Secondary | ICD-10-CM

## 2018-12-02 DIAGNOSIS — E1140 Type 2 diabetes mellitus with diabetic neuropathy, unspecified: Secondary | ICD-10-CM | POA: Diagnosis present

## 2018-12-02 DIAGNOSIS — I493 Ventricular premature depolarization: Secondary | ICD-10-CM

## 2018-12-02 DIAGNOSIS — Z9989 Dependence on other enabling machines and devices: Secondary | ICD-10-CM | POA: Diagnosis not present

## 2018-12-02 DIAGNOSIS — E1151 Type 2 diabetes mellitus with diabetic peripheral angiopathy without gangrene: Secondary | ICD-10-CM | POA: Diagnosis present

## 2018-12-02 DIAGNOSIS — Z8249 Family history of ischemic heart disease and other diseases of the circulatory system: Secondary | ICD-10-CM

## 2018-12-02 DIAGNOSIS — F329 Major depressive disorder, single episode, unspecified: Secondary | ICD-10-CM | POA: Diagnosis not present

## 2018-12-02 DIAGNOSIS — Z8673 Personal history of transient ischemic attack (TIA), and cerebral infarction without residual deficits: Secondary | ICD-10-CM

## 2018-12-02 DIAGNOSIS — I10 Essential (primary) hypertension: Secondary | ICD-10-CM | POA: Diagnosis not present

## 2018-12-02 DIAGNOSIS — Z9114 Patient's other noncompliance with medication regimen: Secondary | ICD-10-CM | POA: Diagnosis not present

## 2018-12-02 DIAGNOSIS — E1122 Type 2 diabetes mellitus with diabetic chronic kidney disease: Secondary | ICD-10-CM | POA: Diagnosis not present

## 2018-12-02 DIAGNOSIS — E785 Hyperlipidemia, unspecified: Secondary | ICD-10-CM | POA: Diagnosis not present

## 2018-12-02 DIAGNOSIS — I130 Hypertensive heart and chronic kidney disease with heart failure and stage 1 through stage 4 chronic kidney disease, or unspecified chronic kidney disease: Secondary | ICD-10-CM | POA: Diagnosis not present

## 2018-12-02 DIAGNOSIS — I5022 Chronic systolic (congestive) heart failure: Secondary | ICD-10-CM | POA: Diagnosis present

## 2018-12-02 HISTORY — PX: RIGHT/LEFT HEART CATH AND CORONARY ANGIOGRAPHY: CATH118266

## 2018-12-02 LAB — GLUCOSE, CAPILLARY
Glucose-Capillary: 131 mg/dL — ABNORMAL HIGH (ref 70–99)
Glucose-Capillary: 139 mg/dL — ABNORMAL HIGH (ref 70–99)
Glucose-Capillary: 124 mg/dL — ABNORMAL HIGH (ref 70–99)

## 2018-12-02 LAB — CBC WITH DIFFERENTIAL/PLATELET
Abs Immature Granulocytes: 0.01 10*3/uL (ref 0.00–0.07)
Basophils Absolute: 0 10*3/uL (ref 0.0–0.1)
Basophils Relative: 0 %
Eosinophils Absolute: 0.3 10*3/uL (ref 0.0–0.5)
Eosinophils Relative: 6 %
HCT: 37.7 % — ABNORMAL LOW (ref 39.0–52.0)
Hemoglobin: 12.8 g/dL — ABNORMAL LOW (ref 13.0–17.0)
Immature Granulocytes: 0 %
Lymphocytes Relative: 26 %
Lymphs Abs: 1.4 10*3/uL (ref 0.7–4.0)
MCH: 27.6 pg (ref 26.0–34.0)
MCHC: 34 g/dL (ref 30.0–36.0)
MCV: 81.4 fL (ref 80.0–100.0)
MONO ABS: 0.3 10*3/uL (ref 0.1–1.0)
Monocytes Relative: 5 %
Neutro Abs: 3.5 10*3/uL (ref 1.7–7.7)
Neutrophils Relative %: 63 %
Platelets: 229 10*3/uL (ref 150–400)
RBC: 4.63 MIL/uL (ref 4.22–5.81)
RDW: 15.5 % (ref 11.5–15.5)
WBC: 5.5 10*3/uL (ref 4.0–10.5)
nRBC: 0 % (ref 0.0–0.2)

## 2018-12-02 LAB — POCT I-STAT 3, VENOUS BLOOD GAS (G3P V)
ACID-BASE DEFICIT: 8 mmol/L — AB (ref 0.0–2.0)
BICARBONATE: 24.2 mmol/L (ref 20.0–28.0)
Bicarbonate: 16.5 mmol/L — ABNORMAL LOW (ref 20.0–28.0)
O2 Saturation: 61 %
O2 Saturation: 68 %
PO2 VEN: 31 mmHg — AB (ref 32.0–45.0)
TCO2: 17 mmol/L — ABNORMAL LOW (ref 22–32)
TCO2: 25 mmol/L (ref 22–32)
pCO2, Ven: 28.1 mmHg — ABNORMAL LOW (ref 44.0–60.0)
pCO2, Ven: 37.9 mmHg — ABNORMAL LOW (ref 44.0–60.0)
pH, Ven: 7.378 (ref 7.250–7.430)
pH, Ven: 7.414 (ref 7.250–7.430)
pO2, Ven: 35 mmHg (ref 32.0–45.0)

## 2018-12-02 LAB — POCT I-STAT 3, ART BLOOD GAS (G3+)
Acid-Base Excess: 1 mmol/L (ref 0.0–2.0)
Bicarbonate: 24.9 mmol/L (ref 20.0–28.0)
O2 Saturation: 98 %
PCO2 ART: 36.1 mmHg (ref 32.0–48.0)
TCO2: 26 mmol/L (ref 22–32)
pH, Arterial: 7.446 (ref 7.350–7.450)
pO2, Arterial: 99 mmHg (ref 83.0–108.0)

## 2018-12-02 LAB — CREATININE, SERUM
Creatinine, Ser: 1.79 mg/dL — ABNORMAL HIGH (ref 0.61–1.24)
GFR calc non Af Amer: 38 mL/min — ABNORMAL LOW (ref >60)
GFR, EST AFRICAN AMERICAN: 44 mL/min — AB (ref >60)

## 2018-12-02 SURGERY — RIGHT/LEFT HEART CATH AND CORONARY ANGIOGRAPHY
Anesthesia: LOCAL

## 2018-12-02 MED ORDER — FUROSEMIDE 10 MG/ML IJ SOLN: 40.0000 mg | Freq: Two times a day (BID) | INTRAMUSCULAR | Status: DC

## 2018-12-02 MED ORDER — AMIODARONE LOAD VIA INFUSION
150.0000 mg | Freq: Once | INTRAVENOUS | Status: AC
  Administered 2018-12-02: 150 mg via INTRAVENOUS
  Filled 2018-12-02: qty 83.34

## 2018-12-02 MED ORDER — ACETAMINOPHEN 325 MG PO TABS: 650.0000 mg | ORAL_TABLET | ORAL | Status: DC | PRN

## 2018-12-02 MED ORDER — ASPIRIN 81 MG PO CHEW: 81.0000 mg | CHEWABLE_TABLET | ORAL | Status: AC

## 2018-12-02 MED ORDER — LISINOPRIL 5 MG PO TABS: 2.5000 mg | ORAL_TABLET | Freq: Every day | ORAL | Status: DC

## 2018-12-02 MED ORDER — IOHEXOL 350 MG/ML SOLN
INTRAVENOUS | Status: DC | PRN
  Administered 2018-12-02: 60 mL via INTRA_ARTERIAL

## 2018-12-02 MED ORDER — AMIODARONE HCL IN DEXTROSE 360-4.14 MG/200ML-% IV SOLN
30.0000 mg/h | INTRAVENOUS | Status: DC
  Administered 2018-12-02 (×2): 30 mg/h via INTRAVENOUS
  Filled 2018-12-02 (×2): qty 200

## 2018-12-02 MED ORDER — SODIUM CHLORIDE 0.9% FLUSH: 3.0000 mL | INTRAVENOUS | Status: DC | PRN

## 2018-12-02 MED ORDER — HEPARIN (PORCINE) IN NACL 1000-0.9 UT/500ML-% IV SOLN
INTRAVENOUS | Status: AC
  Filled 2018-12-02: qty 500

## 2018-12-02 MED ORDER — ONDANSETRON HCL 4 MG/2ML IJ SOLN: 4.0000 mg | Freq: Four times a day (QID) | INTRAMUSCULAR | Status: DC | PRN

## 2018-12-02 MED ORDER — FUROSEMIDE 10 MG/ML IJ SOLN: 20.0000 mg | Freq: Once | INTRAMUSCULAR | Status: DC

## 2018-12-02 MED ORDER — SODIUM CHLORIDE 0.9 % IV SOLN: INTRAVENOUS | Status: AC

## 2018-12-02 MED ORDER — INSULIN ASPART 100 UNIT/ML ~~LOC~~ SOLN
0.0000 [IU] | Freq: Three times a day (TID) | SUBCUTANEOUS | Status: DC
  Administered 2018-12-03 (×2): 2 [IU] via SUBCUTANEOUS

## 2018-12-02 MED ORDER — FUROSEMIDE 10 MG/ML IJ SOLN: 20.0000 mg | Freq: Two times a day (BID) | INTRAMUSCULAR | Status: DC

## 2018-12-02 MED ORDER — LIDOCAINE HCL (PF) 1 % IJ SOLN
INTRAMUSCULAR | Status: DC | PRN
  Administered 2018-12-02: 5 mL

## 2018-12-02 MED ORDER — ATORVASTATIN CALCIUM 10 MG PO TABS
20.0000 mg | ORAL_TABLET | Freq: Every day | ORAL | Status: DC
  Administered 2018-12-03: 20 mg via ORAL
  Filled 2018-12-02: qty 2

## 2018-12-02 MED ORDER — HEPARIN (PORCINE) IN NACL 1000-0.9 UT/500ML-% IV SOLN
INTRAVENOUS | Status: DC | PRN
  Administered 2018-12-02 (×2): 500 mL

## 2018-12-02 MED ORDER — SODIUM CHLORIDE 0.9 % IV SOLN: 250.0000 mL | INTRAVENOUS | Status: DC | PRN

## 2018-12-02 MED ORDER — LOSARTAN POTASSIUM 25 MG PO TABS
25.0000 mg | ORAL_TABLET | Freq: Every day | ORAL | Status: DC
  Administered 2018-12-03: 25 mg via ORAL
  Filled 2018-12-02: qty 1

## 2018-12-02 MED ORDER — VERAPAMIL HCL 2.5 MG/ML IV SOLN
INTRAVENOUS | Status: DC | PRN
  Administered 2018-12-02: 10 mL via INTRA_ARTERIAL

## 2018-12-02 MED ORDER — HEPARIN SODIUM (PORCINE) 1000 UNIT/ML IJ SOLN
INTRAMUSCULAR | Status: AC
  Filled 2018-12-02: qty 1

## 2018-12-02 MED ORDER — VERAPAMIL HCL 2.5 MG/ML IV SOLN
INTRAVENOUS | Status: AC
  Filled 2018-12-02: qty 2

## 2018-12-02 MED ORDER — AMIODARONE HCL IN DEXTROSE 360-4.14 MG/200ML-% IV SOLN
60.0000 mg/h | INTRAVENOUS | Status: AC
  Administered 2018-12-02: 60 mg/h via INTRAVENOUS
  Filled 2018-12-02: qty 200

## 2018-12-02 MED ORDER — SODIUM CHLORIDE 0.9% FLUSH
3.0000 mL | Freq: Two times a day (BID) | INTRAVENOUS | Status: DC
  Administered 2018-12-02: 3 mL via INTRAVENOUS

## 2018-12-02 MED ORDER — SODIUM CHLORIDE 0.9 % IV SOLN: INTRAVENOUS | Status: DC

## 2018-12-02 MED ORDER — ENOXAPARIN SODIUM 40 MG/0.4ML ~~LOC~~ SOLN
40.0000 mg | SUBCUTANEOUS | Status: DC
  Administered 2018-12-03: 40 mg via SUBCUTANEOUS
  Filled 2018-12-02: qty 0.4

## 2018-12-02 MED ORDER — ENOXAPARIN SODIUM 40 MG/0.4ML ~~LOC~~ SOLN: 40.0000 mg | SUBCUTANEOUS | Status: DC

## 2018-12-02 MED ORDER — HEPARIN SODIUM (PORCINE) 1000 UNIT/ML IJ SOLN
INTRAMUSCULAR | Status: DC | PRN
  Administered 2018-12-02: 4000 [IU] via INTRAVENOUS

## 2018-12-02 MED ORDER — LIDOCAINE HCL (PF) 1 % IJ SOLN
INTRAMUSCULAR | Status: AC
  Filled 2018-12-02: qty 30

## 2018-12-02 MED ORDER — HEPARIN (PORCINE) IN NACL 1000-0.9 UT/500ML-% IV SOLN
INTRAVENOUS | Status: AC
  Filled 2018-12-02: qty 1000

## 2018-12-02 MED ORDER — CARVEDILOL 6.25 MG PO TABS
6.2500 mg | ORAL_TABLET | Freq: Two times a day (BID) | ORAL | Status: DC
  Administered 2018-12-02 – 2018-12-03 (×2): 6.25 mg via ORAL
  Filled 2018-12-02 (×2): qty 1

## 2018-12-02 MED ORDER — ASPIRIN EC 81 MG PO TBEC
81.0000 mg | DELAYED_RELEASE_TABLET | Freq: Every day | ORAL | Status: DC
  Administered 2018-12-02 – 2018-12-03 (×2): 81 mg via ORAL
  Filled 2018-12-02 (×2): qty 1

## 2018-12-02 MED ORDER — INSULIN ASPART 100 UNIT/ML ~~LOC~~ SOLN: 0.0000 [IU] | Freq: Every day | SUBCUTANEOUS | Status: DC

## 2018-12-02 SURGICAL SUPPLY — 14 items
CATH 5FR JL3.5 JR4 ANG PIG MP (CATHETERS) ×2 IMPLANT
CATH BALLN WEDGE 5F 110CM (CATHETERS) ×2 IMPLANT
DEVICE RAD COMP TR BAND LRG (VASCULAR PRODUCTS) ×2 IMPLANT
GLIDESHEATH SLEND SS 6F .021 (SHEATH) ×2 IMPLANT
GUIDEWIRE INQWIRE 1.5J.035X260 (WIRE) ×1 IMPLANT
INQWIRE 1.5J .035X260CM (WIRE) ×2
KIT HEART LEFT (KITS) ×2 IMPLANT
KIT MICROPUNCTURE NIT STIFF (SHEATH) ×2 IMPLANT
PACK CARDIAC CATHETERIZATION (CUSTOM PROCEDURE TRAY) ×2 IMPLANT
SHEATH GLIDE SLENDER 4/5FR (SHEATH) ×2 IMPLANT
SHEATH PROBE COVER 6X72 (BAG) ×2 IMPLANT
SYR MEDRAD MARK 7 150ML (SYRINGE) IMPLANT
TRANSDUCER W/STOPCOCK (MISCELLANEOUS) ×2 IMPLANT
TUBING CIL FLEX 10 FLL-RA (TUBING) ×2 IMPLANT

## 2018-12-02 NOTE — Progress Notes (Signed)
Pt is very confused at this time.  RT and RN concluded placing pt on [place] would be better than using cpap for the night. RT will monitor.

## 2018-12-02 NOTE — Consult Note (Addendum)
Advanced Heart Failure Team Consult Note   Primary Physician: Marco Collie, MD PCP-Cardiologist:  No primary care provider on file.  Reason for Consultation: Acute systolic HF  HPI:    Aaron Mclean is seen today for evaluation of acute systolic HF at the request of Dr [name].   Aaron Mclean is a 67 y.o. male with a history of DM2, neuropathy, HTN, CKD 3, CVA vs TIA 05/2018, and hyperlipidemia. He has newly diagnosed mod to severe MR and acute systolic HF with EF 53-61%. He also has frequent PVCs and suspected OSA.  He tells me he has been feeling poorly over the last 6 months. His mother passed away 6 months ago and he has been dealing with stress of that in addition to his brothers trying to get his inheritance. He says he had a stroke (sounds like it may have been a TIA) 5 months ago and was started on bystolic at that time. He was diagnosed 4 months ago with congestive heart failure and was started on Entresto about 2 weeks ago. He was not taking any diuretics at home. He is a poor historian. He says he has had progressive SOB and fatigue over the last 6 months. Denies edema, orthopnea, or PND. Unsure if he still snores, but had a positive sleep study in 1969. Does not wear CPAP. No cough. Appetite has been okay. Energy level poor. Denies CP, but describes a chest "ticking" over the last two weeks since he started [place]. He ran out of bystolic 1 week ago. He has lost 30-35 lbs since his mother died 6 months ago. He became SOB 12/22 pm, so he called 911 and was taken to [facility].   Pertinent admission labs include: creatinine 1.6, BNP 16,100, WBC 6.5, hemoglobin 14.0, K 4.5, CXR: vascular congestion, mild pulmonary edema, small bilateral pleural effusions.  He diuresed 10 lbs with 20 mg IV lasix BID overnight. He had an echo that showed EF 20-25% and mod to severe MR. Cardiology was consulted and recommended transfer to [facility] for ischemic evaluation. He was noted to have 30% PVC  burden on telemetry.   He feels much better today. He is mostly worried about getting home to feed his dog. Denies CP or SOB. Symptoms resolved with lasix. Creatinine 1.5  Echo 12/01/18: severe global HK of LV, EF 20-25%, RV mildly enlarged and mildly reduced function, LA mildly dilated, RA mildly dilated, mod to severe MR  FH: HTN in father. No hx of CAD or CHF.  SH: He smokes 2-3 cigars daily for 5 years. Quit 2 years ago. No ETOH use. He lives alone in [place]. Drives himself to appointments. He has insurance. Poor health literacy. ?medication noncompliance  Review of Systems: [y] = yes, [ ]  = no   General: Weight gain [ ] ; Weight loss [ y]; Anorexia Blue.Reese ]; Fatigue Blue.Reese ]; Fever [ ] ; Chills [ ] ; Weakness [ ]   Cardiac: Chest pain/pressure [ ] ; Resting SOB [ y]; Exertional SOB Blue.Reese ]; Orthopnea [ ] ; Pedal Edema [ ] ; Palpitations [ ] ; Syncope [ ] ; Presyncope [ ] ; Paroxysmal nocturnal dyspnea[ ]   Pulmonary: Cough [ ] ; Wheezing[ ] ; Hemoptysis[ ] ; Sputum [ ] ; Snoring [ ]   GI: Vomiting[ ] ; Dysphagia[ ] ; Melena[ ] ; Hematochezia [ ] ; Heartburn[ ] ; Abdominal pain [ ] ; Constipation [ ] ; Diarrhea [ ] ; BRBPR [ ]   GU: Hematuria[ ] ; Dysuria [ ] ; Nocturia[ ]   Vascular: Pain in legs with walking [ ] ; Pain in feet  with lying flat [ ] ; Non-healing sores [ ] ; Stroke [ ] ; TIA [ ] ; Slurred speech [ ] ;  Neuro: Headaches[ ] ; Vertigo[ ] ; Seizures[ ] ; Paresthesias[ ] ;Blurred vision [ ] ; Diplopia [ ] ; Vision changes [ ]   Ortho/Skin: Arthritis [ ] ; Joint pain [ ] ; Muscle pain [ ] ; Joint swelling [ ] ; Back Pain [ ] ; Rash [ ]   Psych: Depression[ y]; Anxiety[ ]   Heme: Bleeding problems [ ] ; Clotting disorders [ ] ; Anemia [ ]   Endocrine: Diabetes [ ] ; Thyroid dysfunction[ ]   Home Medications Prior to Admission medications   Medication Sig Start Date End Date Taking? Authorizing Provider  acetaminophen (TYLENOL) 325 MG tablet Take 650 mg by mouth 3 (three) times daily as needed for moderate pain or headache.    [provider]  amoxicillin-clavulanate (AUGMENTIN) 875-125 MG tablet Take 1 tablet by mouth 2 (two) times daily. 08/16/18   [name], DPM  aspirin EC 81 MG tablet Take 81 mg by mouth daily.     [provider]  docusate sodium (COLACE) 100 MG capsule Take 1 capsule (100 mg total) by mouth 2 (two) times daily. 07/02/17   [name], DPM  doxycycline (VIBRA-TABS) 100 MG tablet Take 1 tablet (100 mg total) by mouth 2 (two) times daily. 09/12/17   [name], DPM  gabapentin (NEURONTIN) 300 MG capsule Take 300 mg by mouth 3 (three) times daily as needed (neuropathy).    [provider]  ibuprofen (ADVIL,MOTRIN) 200 MG tablet Take 400 mg by mouth every 6 (six) hours as needed for headache or moderate pain.    [provider]  LEVEMIR FLEXTOUCH 100 UNIT/ML Pen Inject 5-20 Units into the skin at bedtime as needed (high blood sugar).  10/12/16   [provider]  losartan-hydrochlorothiazide (HYZAAR) 100-25 MG tablet Take 1 tablet by mouth daily.  07/21/16   [provider]  metroNIDAZOLE (FLAGYL) 500 MG tablet Take 500 mg by mouth 3 (three) times daily.    [provider]  Multiple Vitamin (MULTIVITAMIN WITH MINERALS) TABS tablet Take 1 tablet by mouth 2 (two) times a week.    [provider]  nebivolol (BYSTOLIC) 10 MG tablet Take 10 mg by mouth daily.    [provider]  promethazine (PHENERGAN) 12.5 MG tablet Take 1 tablet (12.5 mg total) by mouth every 6 (six) hours as needed for nausea or vomiting. 07/02/17   [name], DPM  spironolactone (ALDACTONE) 25 MG tablet Take 25 mg by mouth daily.    [provider]  sulfamethoxazole-trimethoprim (BACTRIM DS,SEPTRA DS) 800-160 MG tablet Take 1 tablet by mouth 2 (two) times daily. 07/01/18   [name], DPM    Past Medical History: Past Medical History:  Diagnosis Date  . A-fib ([place])   . Arthritis   . CKD (chronic kidney disease)   . Constipation   .  Diabetes mellitus without complication ([place])   . Dysrhythmia    a-fib  - 3 episodes first of 2018. None since starting Bystolic, (07/15/15 [name] notes patient reported home episodes where heart rate felt irregular. Had PACs, PVCs on EKG.)  . Hemorrhoid    internal  . Hypertension   . Neuropathy   . Sleep apnea    can't use cpap    Past Surgical History: Past Surgical History:  Procedure Laterality Date  . CARDIOVASCULAR STRESS TEST     05/01/17 [facility]): No ischemia. Fixed defect in the inferior segment most likely from scar from old MI.  Marked hypokinetic inferior segment of LV with EF 37%.   . EXOSTECTECTOMY TOE Right 07/02/2017   Procedure: TORSAL EXOSTECTOMY FIFTH METARSAL BASE WOUND DEBRIDMENT AND GRAFT APPLICATION;  Surgeon: [name], DPM;  Location: [place];  Service: Podiatry;  Laterality: Right;  . FOOT TENDON SURGERY Right    spur removed  . GRAFT APPLICATION Right 04/02/5360   Procedure: GRAFT APPLICATION;  Surgeon: [name], DPM;  Location: [place];  Service: Podiatry;  Laterality: Right;  . ROTATOR CUFF REPAIR Right   . WOUND DEBRIDEMENT Right 07/02/2017   Procedure: DEBRIDEMENT WOUND;  Surgeon: [name], DPM;  Location: [place];  Service: Podiatry;  Laterality: Right;    Family History: Family History  Problem Relation Age of Onset  . Alzheimer's disease Mother   . Congestive Heart Failure Mother   . Cancer Father     Social History: Social History   Socioeconomic History  . Marital status: Single    Spouse name: Not on file  . Number of children: Not on file  . Years of education: Not on file  . Highest education level: Not on file  Occupational History  . Not on file  Social Needs  . Financial resource strain: Not on file  . Food insecurity:    Worry: Not on file    Inability: Not on file  . Transportation needs:    Medical: Not on file    Non-medical: Not on file  Tobacco Use  . Smoking status: Former Smoker    Types:  Cigars  . Smokeless tobacco: Never Used  Substance and Sexual Activity  . Alcohol use: No    Alcohol/week: 0.0 standard drinks  . Drug use: No  . Sexual activity: Not on file  Lifestyle  . Physical activity:    Days per week: Not on file    Minutes per session: Not on file  . Stress: Not on file  Relationships  . Social connections:    Talks on phone: Not on file    Gets together: Not on file    Attends religious service: Not on file    Active member of club or organization: Not on file    Attends meetings of clubs or organizations: Not on file    Relationship status: Not on file  Other Topics Concern  . Not on file  Social History Narrative  . Not on file    Allergies:  Allergies  Allergen Reactions  . No Known Allergies     Objective:    Vital Signs:        Weight change: There were no vitals filed for this visit.  Intake/Output:  No intake or output data in the 24 hours ending 12/02/18 1116    Physical Exam    General: Appears fatigued. No resp difficulty HEENT: normal Neck: supple. JVP 6-7. Carotids 2+ bilat; no bruits. No lymphadenopathy or thyromegaly appreciated. Cor: PMI nondisplaced. Distant heart sounds, Regular rate & rhythm with ectopy. No rubs, gallops or murmurs. Lungs: clear Abdomen: soft, nontender, nondistended. No hepatosplenomegaly. No bruits or masses. Good bowel sounds. Extremities: no cyanosis, clubbing, rash, edema, warm Neuro: alert & orientedx3, cranial nerves grossly intact. moves all 4 extremities w/o difficulty. Affect pleasant   Telemetry   NSR with frequent PVCs (bigeminy and trigeminy), at least 30%. Personally reviewed.   EKG    Pending  Labs   Basic Metabolic Panel: No results for input(s): NA, K, CL, CO2, GLUCOSE, BUN, CREATININE, CALCIUM, MG, PHOS in the last 168  hours.  Liver Function Tests: No results for input(s): AST, ALT, ALKPHOS, BILITOT, PROT, ALBUMIN in the last 168 hours. No results for input(s):  LIPASE, AMYLASE in the last 168 hours. No results for input(s): AMMONIA in the last 168 hours.  CBC: No results for input(s): WBC, NEUTROABS, HGB, HCT, MCV, PLT in the last 168 hours.  Cardiac Enzymes: No results for input(s): CKTOTAL, CKMB, CKMBINDEX, TROPONINI in the last 168 hours.  BNP: BNP (last 3 results) No results for input(s): BNP in the last 8760 hours.  ProBNP (last 3 results) No results for input(s): PROBNP in the last 8760 hours.   CBG: No results for input(s): GLUCAP in the last 168 hours.  Coagulation Studies: No results for input(s): LABPROT, INR in the last 72 hours.   Imaging    No results found.   Medications:     Current Medications:    Infusions:      Patient Profile   Aaron Mclean is a 67 y.o. male with a history of DM2, neuropathy, HTN, CKD 3, OSA, and hyperlipidemia. He has newly diagnosed mod to severe MR and acute systolic HF with EF 16-10%. He also has frequent PVCs.   Presented to [facility] on 12/22 with CP. Found to have newly reduced EF 20-25%, mod to severe MR, and frequent PVCs. Transferred to [facility] on 12/23 for ischemic work up.   Assessment/Plan   1. Acute systolic HF. Unclear etiology. Needs LHC. Could also be PVC induced. He may also have OSA. See below.  - EF 20-25% on echo at [place] looks okay. Diuresed 10 lbs with IV lasix at [facility] 20 mg IV lasix x1. Can probably transition to PO tomorrow based on cath results.  - Continue coreg 6.25 mg BID for now. He does not sound low output to me. - Stop lisinopril. Start losartan 25 mg daily and plan to transition back to [facility] after 36 hour washout (received lisinopril this am).  - Plan to start spiro tomorrow if creatinine remains stable after LHC. - He will need a R/LHC. Will schedule for tomorrow morning. Denies any chest pain, but says he had mid sternal "tickling"  2. Frequent PVCs - At least 30% PVCs on tele, may be causing or contributing to  HF - Will need sleep study. See below. - May need to add amio.  3. Mod to severe MR - Noted on Echo at [place].  4. ?OSA - Previously diagnosed with OSA in 1969, but has not been using CPAP for some time. He has lost 30-35 lbs recently and is unsure if he still snores. - Will need outpatient sleep study  5. CKD 3 - Creatinine 1.6 > 1.50 today. Monitor closely with diuresis and LHC tomorrow.   6. Hyperlipemia - Continue statin  7. HTN - Elevated with SBP 140s. Will plan to treat with HF medications.   8. Depression - Has been depressed since mother passed away 6 months ago. Would likely benefit from SSRI. Per primary.   Medication concerns reviewed with patient and pharmacy team. Barriers identified: poor health literacy, ?noncompliance with medication  Addendum: Start IV amio for frequent PVCs. Scheduled for [facility] tomorrow morning 7:30 am. Orders placed.   Length of Stay: [place], NP  12/02/2018, 11:16 AM  Advanced Heart Failure Team Pager [phone] (M-F; [place])  Please contact [facility] for night-coverage after hours (4p -7a ) and weekends on [email or web address]  67 y/o male with probable  OSA transferred from [facility] for further evaluation of new onset systolic HF with EF 83-67%. 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 Plan R/L cath today. Start amio for PVC suppression.   Glori Bickers, MD  5:15 PM

## 2018-12-02 NOTE — H&P (View-Only) (Signed)
Advanced Heart Failure Team Consult Note   Primary Physician: Marco Collie, MD PCP-Cardiologist:  No primary care provider on file.  Reason for Consultation: Acute systolic HF  HPI:    Aaron Mclean is seen today for evaluation of acute systolic HF at the request of Dr [name].   Aaron Mclean is a 67 y.o. male with a history of DM2, neuropathy, HTN, CKD 3, CVA vs TIA 05/2018, and hyperlipidemia. He has newly diagnosed mod to severe MR and acute systolic HF with EF 85-88%. He also has frequent PVCs and suspected OSA.  He tells me he has been feeling poorly over the last 6 months. His mother passed away 6 months ago and he has been dealing with stress of that in addition to his brothers trying to get his inheritance. He says he had a stroke (sounds like it may have been a TIA) 5 months ago and was started on bystolic at that time. He was diagnosed 4 months ago with congestive heart failure and was started on Entresto about 2 weeks ago. He was not taking any diuretics at home. He is a poor historian. He says he has had progressive SOB and fatigue over the last 6 months. Denies edema, orthopnea, or PND. Unsure if he still snores, but had a positive sleep study in 1969. Does not wear CPAP. No cough. Appetite has been okay. Energy level poor. Denies CP, but describes a chest "ticking" over the last two weeks since he started [place]. He ran out of bystolic 1 week ago. He has lost 30-35 lbs since his mother died 6 months ago. He became SOB 12/22 pm, so he called 911 and was [facility] to [facility].   Pertinent admission labs include: creatinine 1.6, BNP 16,100, WBC 6.5, hemoglobin 14.0, K 4.5, CXR: vascular congestion, mild pulmonary edema, small bilateral pleural effusions.  He diuresed 10 lbs with 20 mg IV lasix BID overnight. He had an echo that showed EF 20-25% and mod to severe MR. Cardiology was consulted and recommended transfer to [facility] for ischemic evaluation. He was noted to have 30% PVC  burden on telemetry.   He feels much better today. He is mostly worried about getting home to feed his dog. Denies CP or SOB. Symptoms resolved with lasix. Creatinine 1.5  Echo 12/01/18: severe global HK of LV, EF 20-25%, RV mildly enlarged and mildly reduced function, LA mildly dilated, RA mildly dilated, mod to severe MR  FH: HTN in father. No hx of CAD or CHF.  SH: He smokes 2-3 cigars daily for 5 years. Quit 2 years ago. No ETOH use. He lives alone in [place]. Drives himself to appointments. He has insurance. Poor health literacy. ?medication noncompliance  Review of Systems: [y] = yes, [ ]  = no   General: Weight gain [ ] ; Weight loss [ y]; Anorexia Blue.Reese ]; Fatigue Blue.Reese ]; Fever [ ] ; Chills [ ] ; Weakness [ ]   Cardiac: Chest pain/pressure [ ] ; Resting SOB [ y]; Exertional SOB Blue.Reese ]; Orthopnea [ ] ; Pedal Edema [ ] ; Palpitations [ ] ; Syncope [ ] ; Presyncope [ ] ; Paroxysmal nocturnal dyspnea[ ]   Pulmonary: Cough [ ] ; Wheezing[ ] ; Hemoptysis[ ] ; Sputum [ ] ; Snoring [ ]   GI: Vomiting[ ] ; Dysphagia[ ] ; Melena[ ] ; Hematochezia [ ] ; Heartburn[ ] ; Abdominal pain [ ] ; Constipation [ ] ; Diarrhea [ ] ; BRBPR [ ]   GU: Hematuria[ ] ; Dysuria [ ] ; Nocturia[ ]   Vascular: Pain in legs with walking [ ] ; Pain in feet  with lying flat [ ] ; Non-healing sores [ ] ; Stroke [ ] ; TIA [ ] ; Slurred speech [ ] ;  Neuro: Headaches[ ] ; Vertigo[ ] ; Seizures[ ] ; Paresthesias[ ] ;Blurred vision [ ] ; Diplopia [ ] ; Vision changes [ ]   Ortho/Skin: Arthritis [ ] ; Joint pain [ ] ; Muscle pain [ ] ; Joint swelling [ ] ; Back Pain [ ] ; Rash [ ]   Psych: Depression[ y]; Anxiety[ ]   Heme: Bleeding problems [ ] ; Clotting disorders [ ] ; Anemia [ ]   Endocrine: Diabetes [ ] ; Thyroid dysfunction[ ]   Home Medications Prior to Admission medications   Medication Sig Start Date End Date Taking? Authorizing Provider  acetaminophen (TYLENOL) 325 MG tablet Take 650 mg by mouth 3 (three) times daily as needed for moderate pain or headache.    [provider]  amoxicillin-clavulanate (AUGMENTIN) 875-125 MG tablet Take 1 tablet by mouth 2 (two) times daily. 08/16/18   [name], DPM  aspirin EC 81 MG tablet Take 81 mg by mouth daily.     [provider]  docusate sodium (COLACE) 100 MG capsule Take 1 capsule (100 mg total) by mouth 2 (two) times daily. 07/02/17   [name], DPM  doxycycline (VIBRA-TABS) 100 MG tablet Take 1 tablet (100 mg total) by mouth 2 (two) times daily. 09/12/17   [name], DPM  gabapentin (NEURONTIN) 300 MG capsule Take 300 mg by mouth 3 (three) times daily as needed (neuropathy).    [provider]  ibuprofen (ADVIL,MOTRIN) 200 MG tablet Take 400 mg by mouth every 6 (six) hours as needed for headache or moderate pain.    [provider]  LEVEMIR FLEXTOUCH 100 UNIT/ML Pen Inject 5-20 Units into the skin at bedtime as needed (high blood sugar).  10/12/16   [provider]  losartan-hydrochlorothiazide (HYZAAR) 100-25 MG tablet Take 1 tablet by mouth daily.  07/21/16   [provider]  metroNIDAZOLE (FLAGYL) 500 MG tablet Take 500 mg by mouth 3 (three) times daily.    [provider]  Multiple Vitamin (MULTIVITAMIN WITH MINERALS) TABS tablet Take 1 tablet by mouth 2 (two) times a week.    [provider]  nebivolol (BYSTOLIC) 10 MG tablet Take 10 mg by mouth daily.    [provider]  promethazine (PHENERGAN) 12.5 MG tablet Take 1 tablet (12.5 mg total) by mouth every 6 (six) hours as needed for nausea or vomiting. 07/02/17   [name], DPM  spironolactone (ALDACTONE) 25 MG tablet Take 25 mg by mouth daily.    [provider]  sulfamethoxazole-trimethoprim (BACTRIM DS,SEPTRA DS) 800-160 MG tablet Take 1 tablet by mouth 2 (two) times daily. 07/01/18   [name], DPM    Past Medical History: Past Medical History:  Diagnosis Date  . A-fib ([place])   . Arthritis   . CKD (chronic kidney disease)   . Constipation   .  Diabetes mellitus without complication ([place])   . Dysrhythmia    a-fib  - 3 episodes first of 2018. None since starting Bystolic, (01/15/35 [name] OV notes patient reported home episodes where heart rate felt irregular. Had PACs, PVCs on EKG.)  . Hemorrhoid    internal  . Hypertension   . Neuropathy   . Sleep apnea    can't use cpap    Past Surgical History: Past Surgical History:  Procedure Laterality Date  . CARDIOVASCULAR STRESS TEST     05/01/17 [facility]): No ischemia. Fixed defect in the inferior segment most likely from scar from old MI.  Marked hypokinetic inferior segment of LV with EF 37%.   . EXOSTECTECTOMY TOE Right 07/02/2017   Procedure: TORSAL EXOSTECTOMY FIFTH METARSAL BASE WOUND DEBRIDMENT AND GRAFT APPLICATION;  Surgeon: [name], DPM;  Location: [place];  Service: Podiatry;  Laterality: Right;  . FOOT TENDON SURGERY Right    spur removed  . GRAFT APPLICATION Right 03/09/761   Procedure: GRAFT APPLICATION;  Surgeon: [name], DPM;  Location: [place];  Service: Podiatry;  Laterality: Right;  . ROTATOR CUFF REPAIR Right   . WOUND DEBRIDEMENT Right 07/02/2017   Procedure: DEBRIDEMENT WOUND;  Surgeon: [name], DPM;  Location: [place];  Service: Podiatry;  Laterality: Right;    Family History: Family History  Problem Relation Age of Onset  . Alzheimer's disease Mother   . Congestive Heart Failure Mother   . Cancer Father     Social History: Social History   Socioeconomic History  . Marital status: Single    Spouse name: Not on file  . Number of children: Not on file  . Years of education: Not on file  . Highest education level: Not on file  Occupational History  . Not on file  Social Needs  . Financial resource strain: Not on file  . Food insecurity:    Worry: Not on file    Inability: Not on file  . Transportation needs:    Medical: Not on file    Non-medical: Not on file  Tobacco Use  . Smoking status: Former Smoker    Types:  Cigars  . Smokeless tobacco: Never Used  Substance and Sexual Activity  . Alcohol use: No    Alcohol/week: 0.0 standard drinks  . Drug use: No  . Sexual activity: Not on file  Lifestyle  . Physical activity:    Days per week: Not on file    Minutes per session: Not on file  . Stress: Not on file  Relationships  . Social connections:    Talks on phone: Not on file    Gets together: Not on file    Attends religious service: Not on file    Active member of club or organization: Not on file    Attends meetings of clubs or organizations: Not on file    Relationship status: Not on file  Other Topics Concern  . Not on file  Social History Narrative  . Not on file    Allergies:  Allergies  Allergen Reactions  . No Known Allergies     Objective:    Vital Signs:        Weight change: There were no vitals filed for this visit.  Intake/Output:  No intake or output data in the 24 hours ending 12/02/18 1116    Physical Exam    General: Appears fatigued. No resp difficulty HEENT: normal Neck: supple. JVP 6-7. Carotids 2+ bilat; no bruits. No lymphadenopathy or thyromegaly appreciated. Cor: PMI nondisplaced. Distant heart sounds, Regular rate & rhythm with ectopy. No rubs, gallops or murmurs. Lungs: clear Abdomen: soft, nontender, nondistended. No hepatosplenomegaly. No bruits or masses. Good bowel sounds. Extremities: no cyanosis, clubbing, rash, edema, warm Neuro: alert & orientedx3, cranial nerves grossly intact. moves all 4 extremities w/o difficulty. Affect pleasant   Telemetry   NSR with frequent PVCs (bigeminy and trigeminy), at least 30%. Personally reviewed.   EKG    Pending  Labs   Basic Metabolic Panel: No results for input(s): NA, K, CL, CO2, GLUCOSE, BUN, CREATININE, CALCIUM, MG, PHOS in the last 168  hours.  Liver Function Tests: No results for input(s): AST, ALT, ALKPHOS, BILITOT, PROT, ALBUMIN in the last 168 hours. No results for input(s):  LIPASE, AMYLASE in the last 168 hours. No results for input(s): AMMONIA in the last 168 hours.  CBC: No results for input(s): WBC, NEUTROABS, HGB, HCT, MCV, PLT in the last 168 hours.  Cardiac Enzymes: No results for input(s): CKTOTAL, CKMB, CKMBINDEX, TROPONINI in the last 168 hours.  BNP: BNP (last 3 results) No results for input(s): BNP in the last 8760 hours.  ProBNP (last 3 results) No results for input(s): PROBNP in the last 8760 hours.   CBG: No results for input(s): GLUCAP in the last 168 hours.  Coagulation Studies: No results for input(s): LABPROT, INR in the last 72 hours.   Imaging    No results found.   Medications:     Current Medications:    Infusions:      Patient Profile   NYAIRE DENBLEYKER is a 67 y.o. male with a history of DM2, neuropathy, HTN, CKD 3, OSA, and hyperlipidemia. He has newly diagnosed mod to severe MR and acute systolic HF with EF 29-79%. He also has frequent PVCs.   Presented to [facility] on 12/22 with CP. Found to have newly reduced EF 20-25%, mod to severe MR, and frequent PVCs. Transferred to [facility] on 12/23 for ischemic work up.   Assessment/Plan   1. Acute systolic HF. Unclear etiology. Needs LHC. Could also be PVC induced. He may also have OSA. See below.  - EF 20-25% on echo at [place] looks okay. Diuresed 10 lbs with IV lasix at [place] 20 mg IV lasix x1. Can probably transition to PO tomorrow based on cath results.  - Continue coreg 6.25 mg BID for now. He does not sound low output to me. - Stop lisinopril. Start losartan 25 mg daily and plan to transition back to [facility] after 36 hour washout (received lisinopril this am).  - Plan to start spiro tomorrow if creatinine remains stable after LHC. - He will need a R/LHC. Will schedule for tomorrow morning. Denies any chest pain, but says he had mid sternal "tickling"  2. Frequent PVCs - At least 30% PVCs on tele, may be causing or contributing to  HF - Will need sleep study. See below. - May need to add amio.  3. Mod to severe MR - Noted on Echo at [place].  4. ?OSA - Previously diagnosed with OSA in 1969, but has not been using CPAP for some time. He has lost 30-35 lbs recently and is unsure if he still snores. - Will need outpatient sleep study  5. CKD 3 - Creatinine 1.6 > 1.50 today. Monitor closely with diuresis and LHC tomorrow.   6. Hyperlipemia - Continue statin  7. HTN - Elevated with SBP 140s. Will plan to treat with HF medications.   8. Depression - Has been depressed since mother passed away 6 months ago. Would likely benefit from SSRI. Per primary.   Medication concerns reviewed with patient and pharmacy team. Barriers identified: poor health literacy, ?noncompliance with medication  Addendum: Start IV amio for frequent PVCs. Scheduled for [facility] tomorrow morning 7:30 am. Orders placed.   Length of Stay: [place], NP  12/02/2018, 11:16 AM  Advanced Heart Failure Team Pager [phone] (M-F; [place])  Please contact [facility] for night-coverage after hours (4p -7a ) and weekends on [email or web address]  67 y/o male with probable  OSA transferred from [facility] for further evaluation of new onset systolic HF with EF 84-83%. 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 Plan R/L cath today. Start amio for PVC suppression.   Glori Bickers, MD  5:15 PM

## 2018-12-02 NOTE — H&P (Signed)
History and Physical    Aaron Mclean SVX:[id number] DOB: August 18, 1951 DOA: 12/02/2018  PCP: Marco Collie, MD Consultants:  Otho Perl - cardiology; Rosana Hoes - urology Patient coming from:  Home - lives alone; NOK:  ?  Chief Complaint:  CP, SOB  HPI: Aaron Mclean is a 67 y.o. male with medical history significant of DM and recent diagnosis of CHF presenting to [facility] with CP and SOB.  He presented to [facility] with "tickling in the center of my chest and couldn't get enough air."  His symptoms started right after he started [facility] - every time he took it it had a bad side effect and he felt it "was too harsh."    Symptoms started maybe 2 weeks ago.  He is "very susceptible to medicines".  He presented to [place] last night and today he feels pretty good.  He denies CP and SOB at this time.   Hospital Course: Transfer from [place], as per Dr. [name]:  Following admission, echocardiogram noted global hypokinesis of the left ventricle with an ejection fraction of 20-25% and moderate to severe mitral regurg. Seen by Cardiology who felt patient needed ischemic workup. Patient initially reluctant to transfer to [facility] for cardiac catheterization given concerns about who would watch his dog. Overnight, patient diuresed over 3 L and down almost 10 lb. Cardiology followed up this morning and patient more amenable to transfer. Cardiology feels that this may be PVC induced cardiomyopathy brought on by it undiagnosed obstructive sleep apnea. With diuresis, his renal function has slightly improved who, more reflective of his baseline.   Review of Systems: As per HPI; otherwise review of systems reviewed and negative.   Ambulatory Status:  Ambulates without assistance  Past Medical History:  Diagnosis Date  . A-fib ([place])   . Arthritis   . CKD (chronic kidney disease)   . Constipation   . Diabetes mellitus without complication ([place])   . Dysrhythmia    a-fib  - 3 episodes first of 2018. None  since starting Bystolic, (01/13/29 [name] OV notes patient reported home episodes where heart rate felt irregular. Had PACs, PVCs on EKG.)  . Hemorrhoid    internal  . Hypertension   . Neuropathy   . Sleep apnea    can't use cpap    Past Surgical History:  Procedure Laterality Date  . CARDIOVASCULAR STRESS TEST     05/01/17 [facility]): No ischemia. Fixed defect in the inferior segment most likely from scar from old MI. Marked hypokinetic inferior segment of LV with EF 37%.   . EXOSTECTECTOMY TOE Right 07/02/2017   Procedure: TORSAL EXOSTECTOMY FIFTH METARSAL BASE WOUND DEBRIDMENT AND GRAFT APPLICATION;  Surgeon: [name], DPM;  Location: [place];  Service: Podiatry;  Laterality: Right;  . FOOT TENDON SURGERY Right    spur removed  . GRAFT APPLICATION Right 0/76/2263   Procedure: GRAFT APPLICATION;  Surgeon: [name], DPM;  Location: [place];  Service: Podiatry;  Laterality: Right;  . ROTATOR CUFF REPAIR Right   . WOUND DEBRIDEMENT Right 07/02/2017   Procedure: DEBRIDEMENT WOUND;  Surgeon: [name], DPM;  Location: [place];  Service: Podiatry;  Laterality: Right;    Social History   Socioeconomic History  . Marital status: Single    Spouse name: Not on file  . Number of children: Not on file  . Years of education: Not on file  . Highest education level: Not on file  Occupational History  . Not on file  Social  Needs  . Financial resource strain: Not on file  . Food insecurity:    Worry: Not on file    Inability: Not on file  . Transportation needs:    Medical: Not on file    Non-medical: Not on file  Tobacco Use  . Smoking status: Former Smoker    Types: Cigars  . Smokeless tobacco: Never Used  Substance and Sexual Activity  . Alcohol use: No    Alcohol/week: 0.0 standard drinks  . Drug use: No  . Sexual activity: Not on file  Lifestyle  . Physical activity:    Days per week: Not on file    Minutes per session: Not on file  . Stress: Not on  file  Relationships  . Social connections:    Talks on phone: Not on file    Gets together: Not on file    Attends religious service: Not on file    Active member of club or organization: Not on file    Attends meetings of clubs or organizations: Not on file    Relationship status: Not on file  . Intimate partner violence:    Fear of current or ex partner: Not on file    Emotionally abused: Not on file    Physically abused: Not on file    Forced sexual activity: Not on file  Other Topics Concern  . Not on file  Social History Narrative  . Not on file    Allergies  Allergen Reactions  . No Known Allergies     Family History  Problem Relation Age of Onset  . Alzheimer's disease Mother   . Congestive Heart Failure Mother   . Cancer Father     Prior to Admission medications   Medication Sig Start Date End Date Taking? Authorizing Provider  acetaminophen (TYLENOL) 325 MG tablet Take 650 mg by mouth 3 (three) times daily as needed for moderate pain or headache.    [provider]  amoxicillin-clavulanate (AUGMENTIN) 875-125 MG tablet Take 1 tablet by mouth 2 (two) times daily. 08/16/18   [name], DPM  aspirin EC 81 MG tablet Take 81 mg by mouth daily.     [provider]  docusate sodium (COLACE) 100 MG capsule Take 1 capsule (100 mg total) by mouth 2 (two) times daily. 07/02/17   [name], DPM  doxycycline (VIBRA-TABS) 100 MG tablet Take 1 tablet (100 mg total) by mouth 2 (two) times daily. 09/12/17   [name], DPM  gabapentin (NEURONTIN) 300 MG capsule Take 300 mg by mouth 3 (three) times daily as needed (neuropathy).    [provider]  ibuprofen (ADVIL,MOTRIN) 200 MG tablet Take 400 mg by mouth every 6 (six) hours as needed for headache or moderate pain.    [provider]  LEVEMIR FLEXTOUCH 100 UNIT/ML Pen Inject 5-20 Units into the skin at bedtime as needed (high blood sugar).  10/12/16   [provider]    losartan-hydrochlorothiazide (HYZAAR) 100-25 MG tablet Take 1 tablet by mouth daily.  07/21/16   [provider]  metroNIDAZOLE (FLAGYL) 500 MG tablet Take 500 mg by mouth 3 (three) times daily.    [provider]  Multiple Vitamin (MULTIVITAMIN WITH MINERALS) TABS tablet Take 1 tablet by mouth 2 (two) times a week.    [provider]  nebivolol (BYSTOLIC) 10 MG tablet Take 10 mg by mouth daily.    [provider]  promethazine (PHENERGAN) 12.5 MG tablet Take 1 tablet (12.5 mg total)  by mouth every 6 (six) hours as needed for nausea or vomiting. 07/02/17   [name], DPM  spironolactone (ALDACTONE) 25 MG tablet Take 25 mg by mouth daily.    [provider]  sulfamethoxazole-trimethoprim (BACTRIM DS,SEPTRA DS) 800-160 MG tablet Take 1 tablet by mouth 2 (two) times daily. 07/01/18   [name], DPM    Physical Exam: Vitals:   12/02/18 1131 12/02/18 1133 12/02/18 1151  BP: (!) 145/88    Pulse: (!) 43    Resp: 17    Temp: 97.8 F (36.6 C)    TempSrc: Oral    SpO2: 99%  99%  Weight:  80.5 kg   Height:  6\' 3"  (1.905 m)      General:  Appears calm and comfortable and is NAD; very conversant Eyes:   EOMI, normal lids, iris ENT:  grossly normal hearing, lips & tongue, mmm Neck:  no LAD, masses or thyromegaly Cardiovascular:  RRR, no m/r/g. No LE edema.  Respiratory:   CTA bilaterally with no wheezes/rales/rhonchi.  Normal respiratory effort on RA. Abdomen:  soft, NT, ND, NABS Back:   normal alignment, no CVAT Skin:  no rash or induration seen on limited exam Musculoskeletal:  grossly normal tone BUE/BLE, good ROM, no bony abnormality Psychiatric:  grossly normal mood and affect, speech fluent and appropriate, AOx3 Neurologic:  CN 2-12 grossly intact, moves all extremities in coordinated fashion, sensation intact    Radiological Exams on Admission: No results found.  EKG: not done at [facility]; at [facility], NSR with recurrent PVCs   Labs  on Admission: I have personally reviewed the available labs and imaging studies at the time of the admission.  Pertinent labs:   A1c 5.9 LDL 122, HDL 38   Assessment/Plan Principal Problem:   Acute systolic CHF (congestive heart failure) (HCC) Active Problems:   OSA on CPAP   DM (diabetes mellitus), type 2, uncontrolled (HCC)   CKD (chronic kidney disease) stage 3, GFR 30-59 ml/min (HCC)   Essential hypertension   Dyslipidemia   Acute CHF -Patient with recent diagnosis of CHF presenting to [facility] with worsening SOB and chest pain -Echo with advanced systolic heart failure, EF 30-35%, with cardiomyopathy and moderate to severe mitral regurg -He was found to have recurrent PVCs and there was concern for PVC-induced cardiomyopathy -Patient was transferred to [facility] for cardiac catheterization -Will admit with telemetry -Appears to be fairly euvolemic at this time; will give one additional dose of Lasix tonight -NPO after MN for cath tomorrow -Continue lisinopril and Coreg -CHF order set utilized -Repeat EKG in AM  OSA on CPAP -Suspect undiagnosed OSA, placed on CPAP in the hospital -Needs outpatient sleep study  DM -A1c 5.9, indicating good control -He uses Victoza just a few times weekly -Hold medications -Will cover with moderate-scale SSI  HTN -Previously on Bystolic -This was changed to Coreg while at [facility] -Lisinopril low-dose was also added -He also reports having taken Entresto, but reports significant side effects resulting from this medication  Stage 3 CKD -Now stable, as per OSH -Will check BMP in AM  HLD -LDL 122 -Started on Lipitor at [facility]   DVT prophylaxis:  Lovenox  Code Status:  Full - confirmed with patient; NOTE: this was changed from [facility], where he was DNR - he reports that this was because he was depressed and hopeless at the time Family Communication: None present Disposition Plan:  Home once clinically improved Consults called: Cardiology  Admission  status: Admit - It is my  clinical opinion that admission to INPATIENT is reasonable and necessary because of the expectation that this patient will require hospital care that crosses at least 2 midnights to treat this condition based on the medical complexity of the problems presented.  Given the aforementioned information, the predictability of an adverse outcome is felt to be significant.    Karmen Bongo MD [facility]  If note is complete, please contact covering daytime or nighttime physician. [email or web address] Password [facility]  12/02/2018, 1:57 PM

## 2018-12-02 NOTE — Progress Notes (Signed)
Pt pulled out TRB also pulled out his iv on left arm..pt confused. NO bleeding on incision site rt radial, level 0. Pressure dsg applied. Monitored closely for bleeding

## 2018-12-02 NOTE — Progress Notes (Signed)
Patient pulled IV's out and put in pocket handed to physical therapist because he didn't want any one to think he was an IV drug user. Patient refused to let me restart until he talked to dr. Patient keeps insisting he is leaving and need to catch a bus or taxi back to [place].  Kyshon Tolliver, Tivis Ringer, RN

## 2018-12-02 NOTE — Evaluation (Signed)
Physical Therapy Evaluation Patient Details Name: Aaron Mclean MRN: [id number] DOB: [birth year] Today's Date: 12/02/2018   History of Present Illness  67 y.o. male with a history of DM2, neuropathy, HTN, CKD 3, CVA vs TIA 05/2018, and hyperlipidemia. He has newly diagnosed mod to severe MR and acute systolic HF with EF 78-24%. He also has frequent PVCs and suspected OSA  Clinical Impression  Patient seen for therapy assessment.  Mobilizing well with no noted focal deficits or DOE at this time. Vss with activity on room air, saturations 97% and HR 50s-60s with activity.  No further acute PT needs. Will sign off.  OF NOTE: some cognitive concerns that may need to be addressed for safe discharge  Patient with some noted confusion during session. Appears to be poor historian. OF NOTE: patient indicated that "some lady was real nice and let me stay in her bed here" referring to the hospital bed. And he also states " I didn't want people to think i was doing drugs to i carefully took these out of my arms" referring to the IVs that had been placed by nursing. Patient removed them and placed them in his pockets. Patient also states that he has an oxygen maker at home that use to belong to his aunt, that he wasn't to hook it up and turn it all the way up and wear it around the house.    Follow Up Recommendations Supervision/Assistance - 24 hour(due to cognitive concerns)    Equipment Recommendations  None recommended by PT    Recommendations for Other Services       Precautions / Restrictions Precautions Precautions: Fall      Mobility  Bed Mobility Overal bed mobility: Modified Independent                Transfers Overall transfer level: Needs assistance Equipment used: None Transfers: Sit to/from Stand Sit to Stand: Supervision         General transfer comment: no physical assist required, supervision for safeyt due to cognitive concerns1  Ambulation/Gait Ambulation/Gait  assistance: Supervision Gait Distance (Feet): 320 Feet Assistive device: None Gait Pattern/deviations: Drifts right/left;Trunk flexed     General Gait Details: modest instbaility but no physical assist required. Saturations stable with activity, no DOE at this time. HR 60s- Saturations >95%  Stairs            Wheelchair Mobility    Modified Rankin (Stroke Patients Only)       Balance Overall balance assessment: Mild deficits observed, not formally tested                                           Pertinent Vitals/Pain Pain Assessment: No/denies pain    Home Living Family/patient expects to be discharged to:: Private residence Living Arrangements: Alone   Type of Home: House Home Access: Stairs to enter Entrance Stairs-Rails: None Entrance Stairs-Number of Steps: 3 Home Layout: One level Home Equipment: Cane - single point      Prior Function Level of Independence: Independent with assistive device(s);Independent         Comments: no cane in the house most of the time     Hand Dominance   Dominant Hand: Right    Extremity/Trunk Assessment   Upper Extremity Assessment Upper Extremity Assessment: Overall WFL for tasks assessed    Lower Extremity Assessment Lower Extremity Assessment: RLE deficits/detail(decreased  coordination) RLE Deficits / Details: wears a darko show on RLW RLE Coordination: decreased fine motor;decreased gross motor       Communication   Communication: No difficulties  Cognition Arousal/Alertness: Awake/alert Behavior During Therapy: WFL for tasks assessed/performed Overall Cognitive Status: No family/caregiver present to determine baseline cognitive functioning Area of Impairment: Orientation;Memory;Awareness                 Orientation Level: Disoriented to;Place;Time;Situation   Memory: Decreased short-term memory     Awareness: Emergent   General Comments: patient with some noted confusion  during session. Appears to be poor historian. OF NOTE: patient indicated that "some lady was real nice and let me stay in her bed here" referring to the hospital bed. And he also states " I didn't want people to think i was doing drugs to i carefully took these out of my arms" referring to the IVs that had been placed by nursing. Patient removed them and placed them in his pockets.       General Comments      Exercises     Assessment/Plan    PT Assessment Patent does not need any further PT services  PT Problem List         PT Treatment Interventions      PT Goals (Current goals can be found in the Care Plan section)  Acute Rehab PT Goals Patient Stated Goal:   PT Goal Formulation: All assessment and education complete, DC therapy    Frequency     Barriers to discharge        Co-evaluation               AM-PAC PT "6 Clicks" Mobility  Outcome Measure Help needed turning from your back to your side while in a flat bed without using bedrails?: None Help needed moving from lying on your back to sitting on the side of a flat bed without using bedrails?: None Help needed moving to and from a bed to a chair (including a wheelchair)?: None Help needed standing up from a chair using your arms (e.g., wheelchair or bedside chair)?: None Help needed to walk in hospital room?: None Help needed climbing 3-5 steps with a railing? : A Little 6 Click Score: 23    End of Session   Activity Tolerance: Patient tolerated treatment well Patient left: in bed;with call bell/phone within reach;with bed alarm set Nurse Communication: Mobility status PT Visit Diagnosis: Difficulty in walking, not elsewhere classified (R26.2)    Time: 1324-4010 PT Time Calculation (min) (ACUTE ONLY): 17 min   Charges:   PT Evaluation $PT Eval Moderate Complexity: 1 Mod          Alben Deeds, PT DPT  Board Certified Neurologic Specialist Acute Rehabilitation Services Pager [phone] Office  [phone]   Duncan Dull 12/02/2018, 3:43 PM

## 2018-12-02 NOTE — Interval H&P Note (Signed)
History and Physical Interval Note:  12/02/2018 5:20 PM  Aaron Mclean  has presented today for surgery, with the diagnosis of chf  The various methods of treatment have been discussed with the patient and family. After consideration of risks, benefits and other options for treatment, the patient has consented to  Procedure(s): RIGHT/LEFT HEART CATH AND CORONARY ANGIOGRAPHY (N/A) and possible coronary angioplasty as a surgical intervention .  The patient's history has been reviewed, patient examined, no change in status, stable for surgery.  I have reviewed the patient's chart and labs.  Questions were answered to the patient's satisfaction.     Oluwaseun Bruyere

## 2018-12-03 ENCOUNTER — Encounter (HOSPITAL_COMMUNITY): Payer: Self-pay | Admitting: Internal Medicine

## 2018-12-03 DIAGNOSIS — I10 Essential (primary) hypertension: Secondary | ICD-10-CM

## 2018-12-03 DIAGNOSIS — N183 Chronic kidney disease, stage 3 (moderate): Secondary | ICD-10-CM

## 2018-12-03 DIAGNOSIS — E785 Hyperlipidemia, unspecified: Secondary | ICD-10-CM

## 2018-12-03 DIAGNOSIS — G4733 Obstructive sleep apnea (adult) (pediatric): Secondary | ICD-10-CM

## 2018-12-03 DIAGNOSIS — Z9989 Dependence on other enabling machines and devices: Secondary | ICD-10-CM

## 2018-12-03 LAB — MAGNESIUM: Magnesium: 1.7 mg/dL (ref 1.7–2.4)

## 2018-12-03 LAB — BASIC METABOLIC PANEL
Anion gap: 11 (ref 5–15)
BUN: 30 mg/dL — ABNORMAL HIGH (ref 8–23)
CO2: 23 mmol/L (ref 22–32)
Calcium: 8.7 mg/dL — ABNORMAL LOW (ref 8.9–10.3)
Chloride: 103 mmol/L (ref 98–111)
Creatinine, Ser: 1.71 mg/dL — ABNORMAL HIGH (ref 0.61–1.24)
GFR calc Af Amer: 47 mL/min — ABNORMAL LOW (ref >60)
GFR, EST NON AFRICAN AMERICAN: 40 mL/min — AB (ref >60)
Glucose, Bld: 147 mg/dL — ABNORMAL HIGH (ref 70–99)
Potassium: 3.5 mmol/L (ref 3.5–5.1)
Sodium: 137 mmol/L (ref 135–145)

## 2018-12-03 LAB — HIV ANTIBODY (ROUTINE TESTING W REFLEX): HIV Screen 4th Generation wRfx: NONREACTIVE

## 2018-12-03 LAB — PROTIME-INR
INR: 1.23
Prothrombin Time: 15.3 seconds — ABNORMAL HIGH (ref 11.4–15.2)

## 2018-12-03 LAB — GLUCOSE, CAPILLARY
Glucose-Capillary: 136 mg/dL — ABNORMAL HIGH (ref 70–99)
Glucose-Capillary: 140 mg/dL — ABNORMAL HIGH (ref 70–99)

## 2018-12-03 MED ORDER — SPIRONOLACTONE 12.5 MG HALF TABLET: 12.5000 mg | ORAL_TABLET | Freq: Every day | ORAL | Status: DC

## 2018-12-03 MED ORDER — AMIODARONE HCL 200 MG PO TABS
400.0000 mg | ORAL_TABLET | Freq: Two times a day (BID) | ORAL | Status: DC
  Administered 2018-12-03: 400 mg via ORAL
  Filled 2018-12-03: qty 2

## 2018-12-03 MED ORDER — CARVEDILOL 3.125 MG PO TABS: 3.1250 mg | ORAL_TABLET | Freq: Two times a day (BID) | ORAL | Status: DC

## 2018-12-03 MED ORDER — CARVEDILOL 3.125 MG PO TABS: 3.1250 mg | ORAL_TABLET | Freq: Two times a day (BID) | ORAL | 1 refills | Status: DC

## 2018-12-03 MED ORDER — FUROSEMIDE 40 MG PO TABS: 40.0000 mg | ORAL_TABLET | Freq: Every day | ORAL | 1 refills | Status: DC | PRN

## 2018-12-03 MED ORDER — AMIODARONE HCL 400 MG PO TABS: 400.0000 mg | ORAL_TABLET | Freq: Two times a day (BID) | ORAL | 0 refills | Status: DC

## 2018-12-03 MED ORDER — MIDAZOLAM HCL 2 MG/2ML IJ SOLN
INTRAMUSCULAR | Status: AC
  Filled 2018-12-03: qty 2

## 2018-12-03 MED ORDER — DOCUSATE SODIUM 100 MG PO CAPS: 100.0000 mg | ORAL_CAPSULE | Freq: Two times a day (BID) | ORAL | 0 refills | Status: DC

## 2018-12-03 MED ORDER — POTASSIUM CHLORIDE CRYS ER 20 MEQ PO TBCR
20.0000 meq | EXTENDED_RELEASE_TABLET | Freq: Once | ORAL | Status: AC
  Administered 2018-12-03: 20 meq via ORAL
  Filled 2018-12-03: qty 1

## 2018-12-03 MED ORDER — ATORVASTATIN CALCIUM 80 MG PO TABS: 80.0000 mg | ORAL_TABLET | Freq: Every day | ORAL | Status: DC

## 2018-12-03 MED ORDER — LOSARTAN POTASSIUM 25 MG PO TABS: 25.0000 mg | ORAL_TABLET | Freq: Every day | ORAL | 0 refills | Status: DC

## 2018-12-03 MED ORDER — HEPARIN (PORCINE) IN NACL 1000-0.9 UT/500ML-% IV SOLN
INTRAVENOUS | Status: AC
  Filled 2018-12-03: qty 500

## 2018-12-03 MED ORDER — FENTANYL CITRATE (PF) 100 MCG/2ML IJ SOLN
INTRAMUSCULAR | Status: AC
  Filled 2018-12-03: qty 2

## 2018-12-03 MED ORDER — MAGNESIUM SULFATE 4 GM/100ML IV SOLN
4.0000 g | Freq: Once | INTRAVENOUS | Status: AC
  Administered 2018-12-03: 4 g via INTRAVENOUS
  Filled 2018-12-03: qty 100

## 2018-12-03 MED ORDER — PANTOPRAZOLE SODIUM 40 MG PO TBEC: 40.0000 mg | DELAYED_RELEASE_TABLET | Freq: Every day | ORAL | 0 refills | Status: DC

## 2018-12-03 MED ORDER — PANTOPRAZOLE SODIUM 40 MG PO TBEC
40.0000 mg | DELAYED_RELEASE_TABLET | Freq: Every day | ORAL | Status: DC
  Administered 2018-12-03: 40 mg via ORAL
  Filled 2018-12-03: qty 1

## 2018-12-03 MED ORDER — ASPIRIN EC 81 MG PO TBEC: 81.0000 mg | DELAYED_RELEASE_TABLET | Freq: Every day | ORAL | 0 refills | Status: DC

## 2018-12-03 MED ORDER — ATORVASTATIN CALCIUM 80 MG PO TABS: 80.0000 mg | ORAL_TABLET | Freq: Every day | ORAL | 1 refills | Status: DC

## 2018-12-03 MED ORDER — SPIRONOLACTONE 25 MG PO TABS: 12.5000 mg | ORAL_TABLET | Freq: Every day | ORAL | 0 refills | Status: DC

## 2018-12-03 MED ORDER — LIDOCAINE HCL (PF) 1 % IJ SOLN
INTRAMUSCULAR | Status: AC
  Filled 2018-12-03: qty 30

## 2018-12-03 MED FILL — AMIODARONE HCL 200 MG TABS: 200 | 30 days supply | Qty: 120 | Fill #0

## 2018-12-03 MED FILL — FUROSEMIDE 40 MG TABLET: 40 | 30 days supply | Qty: 30 | Fill #0

## 2018-12-03 MED FILL — ASPIRIN LOW DOSE 81 MG TBEC: 81 | 30 days supply | Qty: 30 | Fill #0

## 2018-12-03 MED FILL — LOSARTAN POTASSIUM 25 MG TA: 25 | 30 days supply | Qty: 30 | Fill #0

## 2018-12-03 MED FILL — ATORVASTATIN CALCIUM 80 MG: 80 | 30 days supply | Qty: 30 | Fill #0

## 2018-12-03 MED FILL — DOK 100 MG CAPS: 100 | 15 days supply | Qty: 30 | Fill #0

## 2018-12-03 MED FILL — CARVEDILOL 3.125 MG TABLET: 3.125 | 30 days supply | Qty: 60 | Fill #0

## 2018-12-03 MED FILL — PANTOPRAZOLE SOD DR 40 MG T: 40 | 30 days supply | Qty: 30 | Fill #0

## 2018-12-03 NOTE — Plan of Care (Signed)
  Problem: Clinical Measurements: Goal: Will remain free from infection Outcome: Progressing   Problem: Clinical Measurements: Goal: Diagnostic test results will improve Outcome: Progressing   Problem: Coping: Goal: Level of anxiety will decrease Outcome: Progressing

## 2018-12-03 NOTE — Discharge Summary (Signed)
Physician Discharge Summary  Aaron Mclean HKV:[id number] DOB: [birth year] DOA: 12/02/2018  PCP: Marco Collie, MD  Admit date: 12/02/2018 Discharge date: 12/03/2018  Admitted From: Home.  Disposition:  HOme.   Recommendations for Outpatient Follow-up:  1. Follow up with PCP in 1-2 weeks 2. Please obtain BMP/CBC in one week 3. Please follow up with Heart failure clinic as recommended.  4. Please follow up with pulmonology for outpatient sleep study.  5. Follow u with cardiac rehab as recommended.    Home Health:yes, and cardiac rehab.   Discharge Condition:Guarded.  CODE STATUS: full code.  Diet recommendation: Heart Healthy    Brief/Interim Summary: Aaron Mclean is a 67 y.o. male with medical history significant of DM and recent diagnosis of CHF presenting to [facility] with CP and SOB.  Transfer from [place], as per Dr. [name]:  Following admission, echocardiogram noted global hypokinesis of the left ventricle with an ejection fraction of 20-25% and moderate to severe mitral regurg. Seen by Cardiology who felt patient needed ischemic workup. Cardiology feels that this may be PVC induced cardiomyopathy brought on by it undiagnosed obstructive sleep apnea. With diuresis, his renal function has slightly improved who, more reflective of his baseline.  He underwent cardiac cath on 12/23 showing   R/LHC 12/02/18:  Prox RCA to Mid RCA lesion is 80% stenosed.  Mid RCA lesion is 60% stenosed.  Dist RCA lesion is 20% stenosed with 20% stenosed side branch in Post Atrio.  Prox Cx lesion is 30% stenosed.  Mid Cx lesion is 100% stenosed.  Ost 2nd Mrg to 2nd Mrg lesion is 70% stenosed.  Ost LM lesion is 30% stenosed.  Prox LAD lesion is 30% stenosed.  Mid LAD to Dist LAD lesion is 60% stenosed.  Cardiology suspect PVC induced CM, started him IV amiodarone and transitioned to oral on discharge. Plan for outpatient PCI of RCA.  Further management medically with aspirin,  statin, coreg, losartan and spironolactone.  Discharge Diagnoses:  Principal Problem:   Acute systolic CHF (congestive heart failure) (HCC) Active Problems:   OSA on CPAP   DM (diabetes mellitus), type 2, uncontrolled (HCC)   CKD (chronic kidney disease) stage 3, GFR 30-59 ml/min (HCC)   Essential hypertension   Dyslipidemia  Acute systolic CHF: LHC 56/43 showed 3v CAD, but out of proportion to EF. HE appears compensated, no sob or chest pain currently.  Prn lasix as needed for pedal edema and fluid overload as per cardiology recommendations.    CAD: 3 vessel CAD ON cardiac cath.  Cardiology to consider outpatient PCI to RCA once more stable.  Continue with aspirin, statin, coreg, losartan and spironolactone.    Mod to severe MR.  Further management as per cardiology.    H/o OSA:  NON COMPLIANT to CPAP.  Recommend outpatient follow up with pulm for outpatient sleep study.    Frequent PVC'S Suspect related to CM.  He was started on amiodarone 400 mg BID, recommend outpatient follow up with Heart failure clinic as recommended.    Stage 3 CKD: Creatinine appears to be at baseline.    Hypertension:  Well controlled.    GERD:  Started the patient on PPI.    Hyperlipidemia:  Increased the statin to 80 mg daily.    Depression:  Would recommend follow up with outpatient psychiatry for medications.     Discharge Instructions   Allergies as of 12/03/2018   No Known Allergies     Medication List    STOP taking  these medications   LEVEMIR FLEXTOUCH 100 UNIT/ML Pen Generic drug:  Insulin Detemir   lisinopril 2.5 MG tablet Commonly known as:  PRINIVIL,ZESTRIL   nebivolol 10 MG tablet Commonly known as:  BYSTOLIC     TAKE these medications   acetaminophen 325 MG tablet Commonly known as:  TYLENOL Take 650 mg by mouth 3 (three) times daily as needed for moderate pain or headache.   amiodarone 400 MG tablet Commonly known as:  PACERONE Take 1  tablet (400 mg total) by mouth 2 (two) times daily.   aspirin EC 81 MG tablet Take 1 tablet (81 mg total) by mouth daily.   atorvastatin 80 MG tablet Commonly known as:  LIPITOR Take 1 tablet (80 mg total) by mouth daily. Start taking on:  December 04, 2018 What changed:    medication strength  how much to take   carvedilol 3.125 MG tablet Commonly known as:  COREG Take 1 tablet (3.125 mg total) by mouth 2 (two) times daily with a meal. What changed:    medication strength  how much to take   docusate sodium 100 MG capsule Commonly known as:  COLACE Take 1 capsule (100 mg total) by mouth 2 (two) times daily.   furosemide 40 MG tablet Commonly known as:  LASIX Take 1 tablet (40 mg total) by mouth daily as needed for fluid or edema.   gabapentin 300 MG capsule Commonly known as:  NEURONTIN Take 300 mg by mouth 3 (three) times daily as needed (neuropathy).   liraglutide 18 MG/3ML Sopn Commonly known as:  VICTOZA Inject 1.2 mg into the skin daily.   losartan 25 MG tablet Commonly known as:  COZAAR Take 1 tablet (25 mg total) by mouth daily. Start taking on:  December 04, 2018   multivitamin with minerals Tabs tablet Take 1 tablet by mouth 2 (two) times a week.   pantoprazole 40 MG tablet Commonly known as:  PROTONIX Take 1 tablet (40 mg total) by mouth daily. Start taking on:  December 04, 2018   promethazine 12.5 MG tablet Commonly known as:  PHENERGAN Take 1 tablet (12.5 mg total) by mouth every 6 (six) hours as needed for nausea or vomiting.   spironolactone 25 MG tablet Commonly known as:  ALDACTONE Take 0.5 tablets (12.5 mg total) by mouth at bedtime.            Durable Medical Equipment  (From admission, onward)         Start     Ordered   12/03/18 1014  Heart failure home health orders  (Heart failure home health orders / Face to face)  Once    Comments:  Heart Failure Follow-up Care:  Verify follow-up appointments per Patient Discharge  Instructions. Confirm transportation arranged. Reconcile home medications with discharge medication list. Remove discontinued medications from use. Assist patient/caregiver to manage medications using pill box. Reinforce low sodium food selection Assessments: Vital signs and oxygen saturation at each visit. Assess home environment for safety concerns, caregiver support and availability of low-sodium foods. Consult Education officer, museum, PT/OT, Dietitian, and CNA based on assessments. Perform comprehensive cardiopulmonary assessment. Notify MD for any change in condition or weight gain of 3 pounds in one day or 5 pounds in one week with symptoms. Daily Weights and Symptom Monitoring: Ensure patient has access to scales. Teach patient/caregiver to weigh daily before breakfast and after voiding using same scale and record.    Teach patient/caregiver to track weight and symptoms and when to notify Provider.  Activity: Develop individualized activity plan with patient/caregiver.  Please check BMET in 1 week and fax to HF clinic. Lots of social barriers. Please have CSW eval.  Question Answer Comment  Heart Failure Follow-up Care [facility] at [phone]   Obtain the following labs Other see comments   Lab frequency Other see comments   Fax lab results to AHF Clinic at [phone]   Diet Low Sodium Heart Healthy   Fluid restrictions: 2000 mL Fluid   Consult: Social work   Consult: [facility] Diuretic Protocol to be used by [place] only ( to be ordered by Heart Failure Team Providers Only) Yes      12/03/18 1015         Follow-up Information    [facility] Follow up on 12/13/2018.   Specialty:  Cardiology Why:  at 8:30 am, parking code [id number]. Please remember to bring all medications. Park under hospital off [address] Look for "heart and vascular" sign. Call clinic if you have  problems with transportation or medications.  Contact information: [address] [id number] [place] [phone]       [facility] Follow up.   Specialty:  Home Health Services Why:  [facility]- disease management- they will call you to set up home visits Contact information: [address] [place] [address] [phone]          No Known Allergies  Consultations:  Heart failure    Procedures/Studies:  No results found. Echocardiogram Cardiac catheterization.)    Subjective: No chest pain or sob, no nausea or vomiting.   Discharge Exam: Vitals:   12/03/18 0449 12/03/18 1200  BP: 106/71 124/84  Pulse: 85 70  Resp: 18   Temp: 97.6 F (36.4 C) 98 F (36.7 C)  SpO2: 100% 100%   Vitals:   12/02/18 1836 12/03/18 0019 12/03/18 0449 12/03/18 1200  BP: (!) 144/72 119/79 106/71 124/84  Pulse: (!) 58 71 85 70  Resp: (!) 21 18 18    Temp: 97.6 F (36.4 C) 98.2 F (36.8 C) 97.6 F (36.4 C) 98 F (36.7 C)  TempSrc: Oral Oral Oral Oral  SpO2: 97% 96% 100% 100%  Weight:   81.7 kg   Height:        General: Pt is alert, awake, not in acute distress Cardiovascular: RRR, S1/S2 +,  Respiratory: CTA bilaterally, no wheezing, no rhonchi Abdominal: Soft, NT, ND, bowel sounds + Extremities: no edema, no cyanosis    The results of significant diagnostics from this hospitalization (including imaging, microbiology, ancillary and laboratory) are listed below for reference.     Microbiology: No results found for this or any previous visit (from the past 240 hour(s)).   Labs: BNP (last 3 results) No results for input(s): BNP in the last 8760 hours. Basic Metabolic Panel: Recent Labs  Lab 12/02/18 1847 12/03/18 0408  NA  --  137  K  --  3.5  CL  --  103  CO2  --  23  GLUCOSE  --  147*  BUN  --  30*  CREATININE 1.79* 1.71*  CALCIUM  --  8.7*  MG  --  1.7   Liver Function Tests: No results for  input(s): AST, ALT, ALKPHOS, BILITOT, PROT, ALBUMIN in the last 168 hours. No results for input(s): LIPASE, AMYLASE in the last 168 hours. No results for input(s): AMMONIA  in the last 168 hours. CBC: Recent Labs  Lab 12/02/18 1847  WBC 5.5  NEUTROABS 3.5  HGB 12.8*  HCT 37.7*  MCV 81.4  PLT 229   Cardiac Enzymes: No results for input(s): CKTOTAL, CKMB, CKMBINDEX, TROPONINI in the last 168 hours. BNP: Invalid input(s): POCBNP CBG: Recent Labs  Lab 12/02/18 1222 12/02/18 1653 12/02/18 2135 12/03/18 0724 12/03/18 1157  GLUCAP 131* 139* 124* 136* 140*   D-Dimer No results for input(s): DDIMER in the last 72 hours. Hgb A1c No results for input(s): HGBA1C in the last 72 hours. Lipid Profile No results for input(s): CHOL, HDL, LDLCALC, TRIG, CHOLHDL, LDLDIRECT in the last 72 hours. Thyroid function studies No results for input(s): TSH, T4TOTAL, T3FREE, THYROIDAB in the last 72 hours.  Invalid input(s): FREET3 Anemia work up No results for input(s): VITAMINB12, FOLATE, FERRITIN, TIBC, IRON, RETICCTPCT in the last 72 hours. Urinalysis No results found for: COLORURINE, APPEARANCEUR, LABSPEC, [place], GLUCOSEU, HGBUR, BILIRUBINUR, KETONESUR, PROTEINUR, UROBILINOGEN, NITRITE, LEUKOCYTESUR Sepsis Labs Invalid input(s): PROCALCITONIN,  WBC,  LACTICIDVEN Microbiology No results found for this or any previous visit (from the past 240 hour(s)).   Time coordinating discharge: 42 minutes  SIGNED:   Hosie Poisson, MD  [facility] 12/03/2018, 1:02 PM Pager   If 7PM-7AM, please contact night-coverage [email or web address] Password [email or web address]

## 2018-12-03 NOTE — Care Management Note (Signed)
Case Management Note Cross Coverage 3E CM Marvetta Gibbons RN [phone]  Patient Details  Name: Aaron Mclean MRN: [id number] Date of Birth: 1951/03/14  Subjective/Objective:   Pt admitted with acute HF                 Action/Plan: PTA pt lived at home, orders placed for [facility] management- CM spoke with pt at bedside- list provided to pt per CMS website for choice with star ratings, per pt he does not have a preference for agency- per pt he goes to the [facility] and sees PA- Engineer, maintenance. Referral called to [facility] with [facility] for [facility] referral - referral has been accepted.   Expected Discharge Date:                  Expected Discharge Plan:  [place]  In-House Referral:  NA  Discharge planning Services  CM Consult  Post Acute Care Choice:  Home Health Choice offered to:  Patient  DME Arranged:    DME Agency:     HH Arranged:  RN, Disease Management [place] Agency:  [place]  Status of Service:  Completed, signed off  If discussed at [place] of Stay Meetings, dates discussed:    Discharge Disposition: Home/home health   Additional Comments:  Dawayne Patricia, RN 12/03/2018, 12:28 PM

## 2018-12-03 NOTE — Progress Notes (Addendum)
Advanced Heart Failure Rounding Note  PCP-Cardiologist: No primary care provider on file.   Subjective:    Went for [facility] yesterday, which showed 3v disease out of proportion to CM. Thought to be mixed ischemic/nonischemic CM due to PVCs. Mod reduced CO.   On IV amio for PVC suppression. He continues to have 21-30 PVCs/min. Mag 1.7, K 3.5. SBP 100-110s.   He is tearful and wants to go home to check on his dog. Denies SOB or CP. Complaining of chest "tickling" after drinking coffee.  R/LHC 12/02/18:  Prox RCA to Mid RCA lesion is 80% stenosed.  Mid RCA lesion is 60% stenosed.  Dist RCA lesion is 20% stenosed with 20% stenosed side branch in Post Atrio.  Prox Cx lesion is 30% stenosed.  Mid Cx lesion is 100% stenosed.  Ost 2nd Mrg to 2nd Mrg lesion is 70% stenosed.  Ost LM lesion is 30% stenosed.  Prox LAD lesion is 30% stenosed.  Mid LAD to Dist LAD lesion is 60% stenosed. Findings: Ao = 132/68 (89) LV = 131/22 RA = 5 RV = 55/6 PA = 53/19 (34) PCW = 23 (v waves to 36) Fick cardiac output/index = 4.4/2.1 PVR = 2.5 WU Ao sat = 98% PA sat = 68%, 61% Assessment: 1. 3v CAD  2. 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 Moderately elevated filling pressures with moderately reduced cardiac output Plan/Discussion: He has significant CAD but LV dysfunction out of proportion to CAD. Suspect severe PVC-induced CM. Will start amiodarone to suppress PVCs and hopefully EF will recover. Once more stable can consider outpatient PCI of RCA. Will treat remainder of CAD medically.   Objective:   Weight Range: 81.7 kg Body mass index is 22.52 kg/m.   Vital Signs:   Temp:  [97.6 F (36.4 C)-98.2 F (36.8 C)] 97.6 F (36.4 C) (12/24 0449) Pulse Rate:  [26-85] 85 (12/24 0449) Resp:  [11-71] 18 (12/24 0449) BP: (85-145)/(64-99) 106/71 (12/24 0449) SpO2:  [0 %-100 %] 100 % (12/24 0449) FiO2 (%):  [21 %] 21 % (12/23 1151) Weight:  [80.5 kg-81.7 kg] 81.7 kg  (12/24 0449) Last BM Date: 12/01/18  Weight change: Filed Weights   12/02/18 1133 12/03/18 0449  Weight: 80.5 kg 81.7 kg    Intake/Output:   Intake/Output Summary (Last 24 hours) at 12/03/2018 0914 Last data filed at 12/03/2018 0616 Gross per 24 hour  Intake 360 ml  Output 1025 ml  Net -665 ml      Physical Exam    General: Tearful. No resp difficulty HEENT: Normal Neck: Supple. JVP 5-6. Carotids 2+ bilat; no bruits. No lymphadenopathy or thyromegaly appreciated. Cor: PMI nondisplaced. Regular rate & rhythm with frequent ectopy. 2/6 MR Lungs: Clear Abdomen: Soft, nontender, nondistended. No hepatosplenomegaly. No bruits or masses. Good bowel sounds. Extremities: No cyanosis, clubbing, rash, edema Neuro: Alert & orientedx3, cranial nerves grossly intact. moves all 4 extremities w/o difficulty. Affect pleasant   Telemetry   NSR 70s with frequent PVCs (21-30/min). Personally reviewed.   EKG    No new tracings.   Labs    CBC Recent Labs    12/02/18 1847  WBC 5.5  NEUTROABS 3.5  HGB 12.8*  HCT 37.7*  MCV 81.4  PLT 195   Basic Metabolic Panel Recent Labs    12/02/18 1847 12/03/18 0408  NA  --  137  K  --  3.5  CL  --  103  CO2  --  23  GLUCOSE  --  147*  BUN  --  30*  CREATININE 1.79* 1.71*  CALCIUM  --  8.7*  MG  --  1.7   Liver Function Tests No results for input(s): AST, ALT, ALKPHOS, BILITOT, PROT, ALBUMIN in the last 72 hours. No results for input(s): LIPASE, AMYLASE in the last 72 hours. Cardiac Enzymes No results for input(s): CKTOTAL, CKMB, CKMBINDEX, TROPONINI in the last 72 hours.  BNP: BNP (last 3 results) No results for input(s): BNP in the last 8760 hours.  ProBNP (last 3 results) No results for input(s): PROBNP in the last 8760 hours.   D-Dimer No results for input(s): DDIMER in the last 72 hours. Hemoglobin A1C No results for input(s): HGBA1C in the last 72 hours. Fasting Lipid Panel No results for input(s): CHOL, HDL,  LDLCALC, TRIG, CHOLHDL, LDLDIRECT in the last 72 hours. Thyroid Function Tests No results for input(s): TSH, T4TOTAL, T3FREE, THYROIDAB in the last 72 hours.  Invalid input(s): FREET3  Other results:   Imaging     No results found.   Medications:     Scheduled Medications: . aspirin EC  81 mg Oral Daily  . atorvastatin  20 mg Oral Daily  . carvedilol  6.25 mg Oral BID WC  . enoxaparin (LOVENOX) injection  40 mg Subcutaneous Q24H  . furosemide  20 mg Intravenous Once  . insulin aspart  0-15 Units Subcutaneous TID WC  . insulin aspart  0-5 Units Subcutaneous QHS  . losartan  25 mg Oral Daily  . sodium chloride flush  3 mL Intravenous Q12H  . sodium chloride flush  3 mL Intravenous Q12H     Infusions: . sodium chloride    . sodium chloride    . amiodarone 30 mg/hr (12/02/18 2100)     PRN Medications:  sodium chloride, sodium chloride, acetaminophen, ondansetron (ZOFRAN) IV, sodium chloride flush, sodium chloride flush    Patient Profile   Aaron Mclean is a 67 y.o. male with a history of DM2, neuropathy, HTN, CKD 3, OSA, and hyperlipidemia. He has newly diagnosed mod to severe MR and acute systolic HF with EF 96-29%. He also has frequent PVCs.   Presented to [facility] on 12/22 with CP. Found to have newly reduced EF 20-25%, mod to severe MR, and frequent PVCs. Transferred to [facility] on 12/23 for ischemic work up.   Assessment/Plan   1. Acute systolic HF.  [place] 12/23 showed 3v CAD, but out of proportion to EF. Thought to be mixed ICM and NICM with frequent PVCs. He may also have OSA. - EF 20-25% on echo at [facility] - Volume stable on exam and on RHC yesterday - Will not start lasix today. Can probably take PRN or a few days/week for home. Creatinine up 1.5 > 1.7 - Continue coreg 6.25 mg BID. Will not increase with marginal CO. - Continue losartan 25 mg daily. Will not switch to [facility] today with SBP 100-110s. - Start spiro 12.5 mg daily. - Consult  cardiac rehab and dietician. He has poor health literacy and needs a lot of reinforcement.  2. Frequent PVCs - At least 30% PVCs on tele, may be causing or contributing to HF.  - Will need sleep study. See below. - Amio did not seem to make a difference in PVCs. Continue IV amio. May need to consult EP vs have him see them as an outpatient. Will discuss with MD. - Mag 1.7 today. Supp. Recheck tomorrow am. K 3.5. Supp 20 meq + add spiro as above.  3. 3 vessel CAD - [facility] 12/23: Prox RCA to Mid RCA lesion is 80% stenosed. Mid RCA lesion is 60% stenosed. Dist RCA lesion is 20% stenosed with 20% stenosed side branch in Post Atrio. Prox Cx lesion is 30% stenosed. Mid Cx lesion is 100% stenosed. Ost 2nd Mrg to 2nd Mrg lesion is 70% stenosed. Ost LM lesion is 30% stenosed. Prox LAD lesion is 30% stenosed. Mid LAD to Dist LAD lesion is 60% stenosed. - Will consider outpatient PCI of RCA once more stable. Treat remainder of CAD medically. - Continue ASA. Increase statin. Continue BB.  4. Mod to severe MR - Noted on Echo at [place]. No change.   5. ?OSA - Previously diagnosed with OSA in 1969, but has not been using CPAP for some time. He has lost 30-35 lbs recently and is unsure if he still snores. - Will need outpatient sleep study. No change.   6. CKD 3 - Creatinine 1.6 > 1.50 > 1.71 today. Hold diuresis today. Monitor closely.   7. Hyperlipemia - Increase statin as above for CAD  8. HTN - SBP 100-110s today.   9. Depression - Has been depressed since mother passed away 6 months ago. Would likely benefit from SSRI. Per primary.  10. Hypomagnesium - Mag 1.7 today. Supp. Recheck tomorrow am.  11. ?GERD - Complaining of "tickling" in chest after drinking. Start PPI to see if it helps. Defer further management to primary team.   Medication concerns reviewed with patient and pharmacy team. Barriers identified: poor health literacy, ?noncompliance with medication.   He is really  concerned about his dog and wants to go home today. I will discuss with MD. May be able to get him home today with close follow up in HF clinic. Will need TOC for medications.   Heart failure team will sign off as of 12/03/18. Unable to provide scale for him today as we are out of them in clinic. Encouraged him to purchase one. Home health RN and CSW orders placed. Meds through TOC. Dr Karleen Hampshire aware and insurance info given to [place].  HF Medication Recommendations for Home: Amio 400 mg BID ASA 81 mg daily  Atorvastatin 80 mg daily Coreg 3.125 mg BID Lasix 40 mg daily (start tomorrow) Losartan 25 mg daily Spiro 12.5 mg daily  Other recommendations (Labs,testing, etc): BMET in 1 week. Needs EKG at follow up. Needs to see CSW at follow up.   Follow up as an outpatient: 12/13/2018 8:30 am  Discussed the above with Dr Haroldine Laws. [place] aware that he is stable for DC and will need meds through TOC.  Length of Stay: [place], NP  12/03/2018, 9:14 AM  Advanced Heart Failure Team Pager [phone] (M-F; 7a - 4p)  Please contact [facility] for night-coverage after hours (4p -7a ) and weekends on [email or web address]  Patient seen and examined with the above-signed Advanced Practice Provider and/or Housestaff. I personally reviewed laboratory data, imaging studies and relevant notes. I independently examined the patient and formulated the important aspects of the plan. I have edited the note to reflect any of my changes or salient points. I have personally discussed the plan with the patient and/or family.  Results of cath reviewed with him. He has significant RCA disease but suspect main issue is PVC cardiomyopathy. Have started amio for PVC suppression. Wants to go home today so will let him go with close f/u. If/wehn LV function improves will consider PCI RCA. D/c meds reviewed personally.  Ok as written above.   Glori Bickers, MD  1:39 PM

## 2018-12-03 NOTE — Progress Notes (Signed)
[id number] Assisted patient out of bed to door x2, patient being discharged, so attempted CHF patient education. CHF book, activity guidelines, low sodium and diabetic diet given. Reviewed daily weights, signs and symptoms, and when to call MD/911. Pt states he's absent minded and was unable to repeat back to me when to call MD for weight gain.  Needs reinforcement. Sol Passer, MS, ACSM CEP

## 2018-12-03 NOTE — Progress Notes (Signed)
CSW met with patient for transportation needs. Patient will be going home to [facility] and taxi would be $55-$60. Patient reports he would be able to pay and that he has cash. Patient made sexual remark to CSW in which CSW left.   CSW notified RN of patient's plan to self pay. RN was given [employer] number of [phone] to call when DC summary is in.   CSW signing off.   [place], [place]

## 2018-12-03 NOTE — Progress Notes (Signed)
Clinical Social Worker following patient for support and discharge needs. Patient was needing a ride home since patient only had enough cash to pay for his medication and not a car ride to [place]. CSW gave patient a cab voucher back home and voucher was given to RN to call when patient was ready for discharge . CSW signing off as patient social work needs have been met.   Rhea Pink, MSW,  [place]

## 2018-12-03 NOTE — Progress Notes (Signed)
Pt awake oriented x 3. With periods of forgetfulness. Verbalized his desire to  go home because he is worried about his dog. Returned to bed and slept. Observe closely

## 2018-12-03 NOTE — Plan of Care (Signed)
Nutrition Education Note  RD consulted for nutrition education regarding new onset CHF.  Spoke with pt at bedside, who was very tangential at time of visit. He is fixated on going home to see his dog; he reports "I never want to come back to the hospital and leave him this long". Education session was focused on self-management to prevent further hospitalizations and complications.   Pt fixated on the "perfect diet" and "vitamins, like vitamin B-4". Focused on consuming a variety of foods (fresh fruits, vegetables, lean proteins, and whole grains). Also discussed ways to flavor food other than salt as well as common high sodium convenience foods.   Case discussed with RN and MD; discussed that pt will likely not retain education and will likely need reinforcement.  RD provided "Low Sodium Nutrition Therapy" handout from the Academy of Nutrition and Dietetics. Reviewed patient's dietary recall. Provided examples on ways to decrease sodium intake in diet. Discouraged intake of processed foods and use of salt shaker. Encouraged fresh fruits and vegetables as well as whole grain sources of carbohydrates to maximize fiber intake.   RD discussed why it is important for patient to adhere to diet recommendations, and emphasized the role of fluids, foods to avoid, and importance of weighing self daily. Teach back method used.  Expect fair to poor compliance.  Body mass index is 22.52 kg/m. Pt meets criteria for normal weight range based on current BMI.  Current diet order is heart healthy, patient is consuming approximately 100% of meals at this time. Labs and medications reviewed. No further nutrition interventions warranted at this time. RD contact information provided. If additional nutrition issues arise, please re-consult RD.   Antonieta Slaven A. Jimmye Norman, RD, LDN, CDE Pager: [phone] After hours Pager: [phone]

## 2018-12-09 ENCOUNTER — Other Ambulatory Visit: Payer: Self-pay | Admitting: Pharmacist

## 2018-12-09 NOTE — Patient Outreach (Signed)
  [place] [facility]) Care Management  [place]  12/09/2018  TAVIS KRING 06-30-1951 [id number]   Reason for referral: Discharge medication management  67 year old M outreached by [place] services for a 30 day post discharge medication review.  PMHx includes, but not limited to, hypertension, heart fialure, dyslipidemia, CKD3, and diabetes.   Unsuccessful telephone call attempt #1 to patient.   Unable to leave message  Plan: Will route note to [facility] pharmacist, Joetta Manners, who will follow up with patient regarding post discharge medication review in 3-4 business days.   Gwenlyn Found, Sherian Rein D PGY1 Pharmacy Resident  Phone [phone] 12/09/2018   9:46 AM

## 2018-12-12 ENCOUNTER — Other Ambulatory Visit: Payer: Self-pay

## 2018-12-12 ENCOUNTER — Ambulatory Visit: Payer: Self-pay

## 2018-12-12 NOTE — Patient Outreach (Addendum)
[place] [facility]) Care Management  [place]  12/12/2018  Aaron Mclean 1951/05/03 [id number]   Reason for call: 30 day post discharge medication review.  Unsuccessful telephone call attempt # 2 to patient.   Unable to leave message.  Wireless customer unavailable and unable to leave a message.   Plan:  I will make another outreach attempt in 3-4 business days.  Joetta Manners, PharmD Clinical Pharmacist [place] [phone]

## 2018-12-13 ENCOUNTER — Inpatient Hospital Stay (HOSPITAL_COMMUNITY): Admit: 2018-12-13 | Payer: PPO

## 2018-12-17 ENCOUNTER — Other Ambulatory Visit: Payer: Self-pay

## 2018-12-17 ENCOUNTER — Ambulatory Visit: Payer: Self-pay

## 2018-12-17 NOTE — Patient Outreach (Signed)
[place] [facility]) Care Management  [place]  12/17/2018  PREM COYKENDALL 01-Mar-1951 [id number]  Reason for call: 30 day post discharge medication review.  Unsuccessful telephone call attempt # 3 to patient.   Unable to leave message.  Wireless customer unavailable and unable to leave a message.  Plan: Will make final call attempt on 12/20/18 and will close case if no contact made.   Joetta Manners, PharmD Clinical Pharmacist [place] [phone]

## 2018-12-18 ENCOUNTER — Encounter: Payer: Self-pay | Admitting: Sports Medicine

## 2018-12-18 ENCOUNTER — Ambulatory Visit (INDEPENDENT_AMBULATORY_CARE_PROVIDER_SITE_OTHER): Payer: PPO | Admitting: Sports Medicine

## 2018-12-18 VITALS — BP 134/75 | HR 61 | Temp 96.1°F | Resp 16

## 2018-12-18 DIAGNOSIS — M86171 Other acute osteomyelitis, right ankle and foot: Secondary | ICD-10-CM | POA: Diagnosis not present

## 2018-12-18 DIAGNOSIS — Z899 Acquired absence of limb, unspecified: Secondary | ICD-10-CM | POA: Diagnosis not present

## 2018-12-18 DIAGNOSIS — E1142 Type 2 diabetes mellitus with diabetic polyneuropathy: Secondary | ICD-10-CM | POA: Diagnosis not present

## 2018-12-18 NOTE — Progress Notes (Signed)
Subjective: Aaron Mclean is a 68 y.o. male patient seen today in office for follow-up right foot exam.  Patient reports that the antibiotics healed his foot and help immediately in the hole is now closed.  Patient states that he has not been back to the office because he was admitted to the hospital and was diagnosed with congestive heart failure and underwent a special study.  Patient reports that now he is out of the hospital and he wanted to drop it today as a walk-in to let him know that he is doing well.  Patient denies redness warmth swelling drainage reopening or re-ulceration or any other constitutional symptoms at this time.    Fasting blood sugar 99 today A1c unknown  Patient Active Problem List   Diagnosis Date Noted  . Acute systolic CHF (congestive heart failure) ([place]) 12/02/2018  . OSA on CPAP 12/02/2018  . DM (diabetes mellitus), type 2, uncontrolled ([place]) 12/02/2018  . CKD (chronic kidney disease) stage 3, GFR 30-59 ml/min (H[facility]) 12/02/2018  . Essential hypertension 12/02/2018  . Dyslipidemia 12/02/2018  . Benign prostatic hyperplasia with urinary obstruction 09/20/2015  . Family history of malignant neoplasm of prostate 09/20/2015  . Excessive urination at night 09/20/2015    Current Outpatient Medications on File Prior to Visit  Medication Sig Dispense Refill  . amiodarone (PACERONE) 400 MG tablet Take 1 tablet (400 mg total) by mouth 2 (two) times daily. 60 tablet 0  . aspirin EC 81 MG tablet Take 1 tablet (81 mg total) by mouth daily. 30 tablet 0  . atorvastatin (LIPITOR) 80 MG tablet Take 1 tablet (80 mg total) by mouth daily. 30 tablet 1  . carvedilol (COREG) 3.125 MG tablet Take 1 tablet (3.125 mg total) by mouth 2 (two) times daily with a meal. 60 tablet 1  . docusate sodium (COLACE) 100 MG capsule Take 1 capsule (100 mg total) by mouth 2 (two) times daily. 10 capsule 0  . furosemide (LASIX) 40 MG tablet Take 1 tablet (40 mg total) by mouth daily as needed for fluid  or edema. 30 tablet 1  . Insulin Degludec-Liraglutide (XULTOPHY [place]) Inject into the skin. Samples, pt unable to provide dose.    . liraglutide (VICTOZA) 18 MG/3ML SOPN Inject 1.2 mg into the skin daily.    Marland Kitchen losartan (COZAAR) 25 MG tablet Take 1 tablet (25 mg total) by mouth daily. 30 tablet 0  . pantoprazole (PROTONIX) 40 MG tablet Take 1 tablet (40 mg total) by mouth daily. 30 tablet 0  . spironolactone (ALDACTONE) 25 MG tablet Take 0.5 tablets (12.5 mg total) by mouth at bedtime. 30 tablet 0   No current facility-administered medications on file prior to visit.     Allergies  Allergen Reactions  . Sertraline Rash    Objective: There were no vitals filed for this visit.  General: No acute distress, AAOx3  Right foot: Healed ulceration plantar lateral foot fourth metatarsal, no increased redness warmth or swelling at first through third toes no acute signs of osteomyelitis unchanged amputation status and neuropathy.  Vascular status within normal limits with no acute compromise.  Assessment and Plan:  Problem List Items Addressed This Visit    None    Visit Diagnoses    Hx of amputation    -  Primary   Acute osteomyelitis of right ankle or foot ([place])       Resolved   Diabetic polyneuropathy associated with type 2 diabetes mellitus ([place])         -  Patient seen and evaluated -Discussed with patient long-term care for resolved osteomyelitis and importance of daily foot inspection -Continue with diabetic shoes and offloading padding to prevent recurrence of rubbing ulceration -Patient to return to office in 3 months for routine diabetic foot exam and foot care  [name], DPM

## 2018-12-20 ENCOUNTER — Ambulatory Visit: Payer: Self-pay

## 2018-12-20 ENCOUNTER — Other Ambulatory Visit: Payer: Self-pay

## 2018-12-20 NOTE — Patient Outreach (Signed)
[place] [facility]) Care Management [place]  12/20/2018  Aaron Mclean 1951/04/07 [id number]  Reason for referral: post discharge medication review  [facility] pharmacy case is being closed due to the following reasons:  We have been unable to establish and/or maintain contact with the patient.  Patient has been provided [facility] CM contact information if assistance needed in the future.    Thank you for allowing [facility] pharmacy to be involved in this patient's care.    Joetta Manners, PharmD Clinical Pharmacist [place] [phone]

## 2018-12-26 DIAGNOSIS — R0602 Shortness of breath: Secondary | ICD-10-CM | POA: Diagnosis not present

## 2018-12-26 DIAGNOSIS — I5030 Unspecified diastolic (congestive) heart failure: Secondary | ICD-10-CM | POA: Diagnosis not present

## 2018-12-26 DIAGNOSIS — G4733 Obstructive sleep apnea (adult) (pediatric): Secondary | ICD-10-CM | POA: Diagnosis not present

## 2018-12-26 DIAGNOSIS — F419 Anxiety disorder, unspecified: Secondary | ICD-10-CM | POA: Diagnosis not present

## 2019-02-21 DIAGNOSIS — Z0000 Encounter for general adult medical examination without abnormal findings: Secondary | ICD-10-CM | POA: Diagnosis not present

## 2019-02-21 DIAGNOSIS — E1169 Type 2 diabetes mellitus with other specified complication: Secondary | ICD-10-CM | POA: Diagnosis not present

## 2019-02-21 DIAGNOSIS — I5030 Unspecified diastolic (congestive) heart failure: Secondary | ICD-10-CM | POA: Diagnosis not present

## 2019-02-21 DIAGNOSIS — E1140 Type 2 diabetes mellitus with diabetic neuropathy, unspecified: Secondary | ICD-10-CM | POA: Diagnosis not present

## 2019-02-21 DIAGNOSIS — Z139 Encounter for screening, unspecified: Secondary | ICD-10-CM | POA: Diagnosis not present

## 2019-02-21 DIAGNOSIS — N390 Urinary tract infection, site not specified: Secondary | ICD-10-CM | POA: Diagnosis not present

## 2019-02-21 DIAGNOSIS — I130 Hypertensive heart and chronic kidney disease with heart failure and stage 1 through stage 4 chronic kidney disease, or unspecified chronic kidney disease: Secondary | ICD-10-CM | POA: Diagnosis not present

## 2019-02-21 DIAGNOSIS — R413 Other amnesia: Secondary | ICD-10-CM | POA: Diagnosis not present

## 2019-02-21 DIAGNOSIS — R630 Anorexia: Secondary | ICD-10-CM | POA: Diagnosis not present

## 2019-02-21 DIAGNOSIS — Z1331 Encounter for screening for depression: Secondary | ICD-10-CM | POA: Diagnosis not present

## 2019-02-24 DIAGNOSIS — I509 Heart failure, unspecified: Secondary | ICD-10-CM | POA: Diagnosis not present

## 2019-02-24 DIAGNOSIS — N390 Urinary tract infection, site not specified: Secondary | ICD-10-CM | POA: Diagnosis not present

## 2019-02-24 DIAGNOSIS — I5020 Unspecified systolic (congestive) heart failure: Secondary | ICD-10-CM | POA: Diagnosis not present

## 2019-02-24 DIAGNOSIS — S299XXA Unspecified injury of thorax, initial encounter: Secondary | ICD-10-CM | POA: Diagnosis not present

## 2019-02-24 DIAGNOSIS — I2510 Atherosclerotic heart disease of native coronary artery without angina pectoris: Secondary | ICD-10-CM | POA: Diagnosis not present

## 2019-02-24 DIAGNOSIS — K828 Other specified diseases of gallbladder: Secondary | ICD-10-CM | POA: Diagnosis not present

## 2019-02-24 DIAGNOSIS — E1122 Type 2 diabetes mellitus with diabetic chronic kidney disease: Secondary | ICD-10-CM | POA: Diagnosis not present

## 2019-02-24 DIAGNOSIS — N179 Acute kidney failure, unspecified: Secondary | ICD-10-CM | POA: Diagnosis not present

## 2019-02-24 DIAGNOSIS — N183 Chronic kidney disease, stage 3 (moderate): Secondary | ICD-10-CM | POA: Diagnosis not present

## 2019-02-24 DIAGNOSIS — I130 Hypertensive heart and chronic kidney disease with heart failure and stage 1 through stage 4 chronic kidney disease, or unspecified chronic kidney disease: Secondary | ICD-10-CM | POA: Diagnosis not present

## 2019-02-24 DIAGNOSIS — G4733 Obstructive sleep apnea (adult) (pediatric): Secondary | ICD-10-CM | POA: Diagnosis not present

## 2019-02-24 DIAGNOSIS — S069X9A Unspecified intracranial injury with loss of consciousness of unspecified duration, initial encounter: Secondary | ICD-10-CM | POA: Diagnosis not present

## 2019-02-24 DIAGNOSIS — M79671 Pain in right foot: Secondary | ICD-10-CM | POA: Diagnosis not present

## 2019-02-24 DIAGNOSIS — I5043 Acute on chronic combined systolic (congestive) and diastolic (congestive) heart failure: Secondary | ICD-10-CM | POA: Diagnosis not present

## 2019-02-24 DIAGNOSIS — E119 Type 2 diabetes mellitus without complications: Secondary | ICD-10-CM | POA: Diagnosis not present

## 2019-02-24 DIAGNOSIS — Z87891 Personal history of nicotine dependence: Secondary | ICD-10-CM | POA: Diagnosis not present

## 2019-02-24 DIAGNOSIS — R0789 Other chest pain: Secondary | ICD-10-CM | POA: Diagnosis not present

## 2019-02-24 DIAGNOSIS — Z888 Allergy status to other drugs, medicaments and biological substances status: Secondary | ICD-10-CM | POA: Diagnosis not present

## 2019-02-24 DIAGNOSIS — Z79899 Other long term (current) drug therapy: Secondary | ICD-10-CM | POA: Diagnosis not present

## 2019-02-24 DIAGNOSIS — I110 Hypertensive heart disease with heart failure: Secondary | ICD-10-CM | POA: Diagnosis not present

## 2019-02-24 DIAGNOSIS — I340 Nonrheumatic mitral (valve) insufficiency: Secondary | ICD-10-CM | POA: Diagnosis not present

## 2019-02-24 DIAGNOSIS — F418 Other specified anxiety disorders: Secondary | ICD-10-CM | POA: Diagnosis not present

## 2019-02-24 DIAGNOSIS — I493 Ventricular premature depolarization: Secondary | ICD-10-CM | POA: Diagnosis not present

## 2019-02-24 DIAGNOSIS — E860 Dehydration: Secondary | ICD-10-CM | POA: Diagnosis not present

## 2019-02-24 DIAGNOSIS — Z881 Allergy status to other antibiotic agents status: Secondary | ICD-10-CM | POA: Diagnosis not present

## 2019-02-24 DIAGNOSIS — E785 Hyperlipidemia, unspecified: Secondary | ICD-10-CM | POA: Diagnosis not present

## 2019-02-24 DIAGNOSIS — R188 Other ascites: Secondary | ICD-10-CM | POA: Diagnosis not present

## 2019-02-24 DIAGNOSIS — S199XXA Unspecified injury of neck, initial encounter: Secondary | ICD-10-CM | POA: Diagnosis not present

## 2019-02-24 DIAGNOSIS — I5023 Acute on chronic systolic (congestive) heart failure: Secondary | ICD-10-CM | POA: Diagnosis not present

## 2019-02-24 DIAGNOSIS — R17 Unspecified jaundice: Secondary | ICD-10-CM | POA: Diagnosis not present

## 2019-02-24 DIAGNOSIS — E1142 Type 2 diabetes mellitus with diabetic polyneuropathy: Secondary | ICD-10-CM | POA: Diagnosis not present

## 2019-02-24 DIAGNOSIS — Z743 Need for continuous supervision: Secondary | ICD-10-CM | POA: Diagnosis not present

## 2019-02-24 DIAGNOSIS — R279 Unspecified lack of coordination: Secondary | ICD-10-CM | POA: Diagnosis not present

## 2019-02-24 DIAGNOSIS — R778 Other specified abnormalities of plasma proteins: Secondary | ICD-10-CM | POA: Diagnosis not present

## 2019-02-24 DIAGNOSIS — E1165 Type 2 diabetes mellitus with hyperglycemia: Secondary | ICD-10-CM | POA: Diagnosis not present

## 2019-02-24 DIAGNOSIS — R7989 Other specified abnormal findings of blood chemistry: Secondary | ICD-10-CM | POA: Diagnosis not present

## 2019-02-24 DIAGNOSIS — E7800 Pure hypercholesterolemia, unspecified: Secondary | ICD-10-CM | POA: Diagnosis not present

## 2019-02-24 DIAGNOSIS — I10 Essential (primary) hypertension: Secondary | ICD-10-CM | POA: Diagnosis not present

## 2019-02-25 DIAGNOSIS — R17 Unspecified jaundice: Secondary | ICD-10-CM

## 2019-02-25 DIAGNOSIS — E119 Type 2 diabetes mellitus without complications: Secondary | ICD-10-CM

## 2019-02-25 DIAGNOSIS — I340 Nonrheumatic mitral (valve) insufficiency: Secondary | ICD-10-CM

## 2019-02-25 DIAGNOSIS — I5023 Acute on chronic systolic (congestive) heart failure: Secondary | ICD-10-CM

## 2019-02-25 DIAGNOSIS — I493 Ventricular premature depolarization: Secondary | ICD-10-CM

## 2019-02-25 DIAGNOSIS — N183 Chronic kidney disease, stage 3 (moderate): Secondary | ICD-10-CM

## 2019-02-25 DIAGNOSIS — I2510 Atherosclerotic heart disease of native coronary artery without angina pectoris: Secondary | ICD-10-CM

## 2019-02-25 DIAGNOSIS — I5043 Acute on chronic combined systolic (congestive) and diastolic (congestive) heart failure: Secondary | ICD-10-CM

## 2019-02-25 DIAGNOSIS — I509 Heart failure, unspecified: Secondary | ICD-10-CM

## 2019-02-27 DIAGNOSIS — M79671 Pain in right foot: Secondary | ICD-10-CM | POA: Diagnosis not present

## 2019-02-27 DIAGNOSIS — K264 Chronic or unspecified duodenal ulcer with hemorrhage: Secondary | ICD-10-CM | POA: Diagnosis not present

## 2019-02-27 DIAGNOSIS — K209 Esophagitis, unspecified: Secondary | ICD-10-CM | POA: Diagnosis not present

## 2019-02-27 DIAGNOSIS — E1151 Type 2 diabetes mellitus with diabetic peripheral angiopathy without gangrene: Secondary | ICD-10-CM | POA: Diagnosis not present

## 2019-02-27 DIAGNOSIS — I5043 Acute on chronic combined systolic (congestive) and diastolic (congestive) heart failure: Secondary | ICD-10-CM | POA: Diagnosis not present

## 2019-02-27 DIAGNOSIS — N179 Acute kidney failure, unspecified: Secondary | ICD-10-CM | POA: Diagnosis not present

## 2019-02-27 DIAGNOSIS — Z89429 Acquired absence of other toe(s), unspecified side: Secondary | ICD-10-CM | POA: Diagnosis not present

## 2019-02-27 DIAGNOSIS — D62 Acute posthemorrhagic anemia: Secondary | ICD-10-CM | POA: Diagnosis not present

## 2019-02-27 DIAGNOSIS — E785 Hyperlipidemia, unspecified: Secondary | ICD-10-CM | POA: Diagnosis not present

## 2019-02-27 DIAGNOSIS — R262 Difficulty in walking, not elsewhere classified: Secondary | ICD-10-CM | POA: Diagnosis not present

## 2019-02-27 DIAGNOSIS — I2510 Atherosclerotic heart disease of native coronary artery without angina pectoris: Secondary | ICD-10-CM | POA: Diagnosis not present

## 2019-02-27 DIAGNOSIS — I361 Nonrheumatic tricuspid (valve) insufficiency: Secondary | ICD-10-CM | POA: Diagnosis not present

## 2019-02-27 DIAGNOSIS — G4733 Obstructive sleep apnea (adult) (pediatric): Secondary | ICD-10-CM | POA: Diagnosis not present

## 2019-02-27 DIAGNOSIS — I5022 Chronic systolic (congestive) heart failure: Secondary | ICD-10-CM | POA: Diagnosis not present

## 2019-02-27 DIAGNOSIS — E7800 Pure hypercholesterolemia, unspecified: Secondary | ICD-10-CM | POA: Diagnosis not present

## 2019-02-27 DIAGNOSIS — K269 Duodenal ulcer, unspecified as acute or chronic, without hemorrhage or perforation: Secondary | ICD-10-CM | POA: Diagnosis not present

## 2019-02-27 DIAGNOSIS — I493 Ventricular premature depolarization: Secondary | ICD-10-CM | POA: Diagnosis not present

## 2019-02-27 DIAGNOSIS — R0902 Hypoxemia: Secondary | ICD-10-CM | POA: Diagnosis not present

## 2019-02-27 DIAGNOSIS — K5791 Diverticulosis of intestine, part unspecified, without perforation or abscess with bleeding: Secondary | ICD-10-CM | POA: Diagnosis not present

## 2019-02-27 DIAGNOSIS — E1122 Type 2 diabetes mellitus with diabetic chronic kidney disease: Secondary | ICD-10-CM | POA: Diagnosis not present

## 2019-02-27 DIAGNOSIS — Z7189 Other specified counseling: Secondary | ICD-10-CM | POA: Diagnosis not present

## 2019-02-27 DIAGNOSIS — I340 Nonrheumatic mitral (valve) insufficiency: Secondary | ICD-10-CM | POA: Diagnosis not present

## 2019-02-27 DIAGNOSIS — Z66 Do not resuscitate: Secondary | ICD-10-CM | POA: Diagnosis not present

## 2019-02-27 DIAGNOSIS — L97513 Non-pressure chronic ulcer of other part of right foot with necrosis of muscle: Secondary | ICD-10-CM | POA: Diagnosis not present

## 2019-02-27 DIAGNOSIS — K319 Disease of stomach and duodenum, unspecified: Secondary | ICD-10-CM | POA: Diagnosis not present

## 2019-02-27 DIAGNOSIS — Z7982 Long term (current) use of aspirin: Secondary | ICD-10-CM | POA: Diagnosis not present

## 2019-02-27 DIAGNOSIS — F05 Delirium due to known physiological condition: Secondary | ICD-10-CM | POA: Diagnosis not present

## 2019-02-27 DIAGNOSIS — I428 Other cardiomyopathies: Secondary | ICD-10-CM | POA: Diagnosis not present

## 2019-02-27 DIAGNOSIS — Z743 Need for continuous supervision: Secondary | ICD-10-CM | POA: Diagnosis not present

## 2019-02-27 DIAGNOSIS — N183 Chronic kidney disease, stage 3 (moderate): Secondary | ICD-10-CM | POA: Diagnosis not present

## 2019-02-27 DIAGNOSIS — K449 Diaphragmatic hernia without obstruction or gangrene: Secondary | ICD-10-CM | POA: Diagnosis not present

## 2019-02-27 DIAGNOSIS — K922 Gastrointestinal hemorrhage, unspecified: Secondary | ICD-10-CM | POA: Diagnosis not present

## 2019-02-27 DIAGNOSIS — N390 Urinary tract infection, site not specified: Secondary | ICD-10-CM | POA: Diagnosis not present

## 2019-02-27 DIAGNOSIS — Z515 Encounter for palliative care: Secondary | ICD-10-CM | POA: Diagnosis not present

## 2019-02-27 DIAGNOSIS — Z794 Long term (current) use of insulin: Secondary | ICD-10-CM | POA: Diagnosis not present

## 2019-02-27 DIAGNOSIS — E119 Type 2 diabetes mellitus without complications: Secondary | ICD-10-CM | POA: Diagnosis not present

## 2019-02-27 DIAGNOSIS — R404 Transient alteration of awareness: Secondary | ICD-10-CM | POA: Diagnosis not present

## 2019-02-27 DIAGNOSIS — R17 Unspecified jaundice: Secondary | ICD-10-CM | POA: Diagnosis not present

## 2019-02-27 DIAGNOSIS — I130 Hypertensive heart and chronic kidney disease with heart failure and stage 1 through stage 4 chronic kidney disease, or unspecified chronic kidney disease: Secondary | ICD-10-CM | POA: Diagnosis not present

## 2019-02-27 DIAGNOSIS — N401 Enlarged prostate with lower urinary tract symptoms: Secondary | ICD-10-CM | POA: Diagnosis not present

## 2019-02-27 DIAGNOSIS — I129 Hypertensive chronic kidney disease with stage 1 through stage 4 chronic kidney disease, or unspecified chronic kidney disease: Secondary | ICD-10-CM | POA: Diagnosis not present

## 2019-02-27 DIAGNOSIS — I10 Essential (primary) hypertension: Secondary | ICD-10-CM | POA: Diagnosis not present

## 2019-02-27 DIAGNOSIS — F0390 Unspecified dementia without behavioral disturbance: Secondary | ICD-10-CM | POA: Diagnosis not present

## 2019-02-27 DIAGNOSIS — R0602 Shortness of breath: Secondary | ICD-10-CM | POA: Diagnosis not present

## 2019-02-27 DIAGNOSIS — R279 Unspecified lack of coordination: Secondary | ICD-10-CM | POA: Diagnosis not present

## 2019-02-27 DIAGNOSIS — Z9119 Patient's noncompliance with other medical treatment and regimen: Secondary | ICD-10-CM | POA: Diagnosis not present

## 2019-02-27 DIAGNOSIS — N138 Other obstructive and reflux uropathy: Secondary | ICD-10-CM | POA: Diagnosis not present

## 2019-02-27 DIAGNOSIS — K259 Gastric ulcer, unspecified as acute or chronic, without hemorrhage or perforation: Secondary | ICD-10-CM | POA: Diagnosis not present

## 2019-02-27 DIAGNOSIS — Z87891 Personal history of nicotine dependence: Secondary | ICD-10-CM | POA: Diagnosis not present

## 2019-02-27 DIAGNOSIS — K5731 Diverticulosis of large intestine without perforation or abscess with bleeding: Secondary | ICD-10-CM | POA: Diagnosis not present

## 2019-02-27 DIAGNOSIS — R58 Hemorrhage, not elsewhere classified: Secondary | ICD-10-CM | POA: Diagnosis not present

## 2019-02-27 DIAGNOSIS — R799 Abnormal finding of blood chemistry, unspecified: Secondary | ICD-10-CM | POA: Diagnosis not present

## 2019-02-27 DIAGNOSIS — I5023 Acute on chronic systolic (congestive) heart failure: Secondary | ICD-10-CM | POA: Diagnosis not present

## 2019-02-27 DIAGNOSIS — Z79899 Other long term (current) drug therapy: Secondary | ICD-10-CM | POA: Diagnosis not present

## 2019-02-27 DIAGNOSIS — N170 Acute kidney failure with tubular necrosis: Secondary | ICD-10-CM | POA: Diagnosis not present

## 2019-02-27 DIAGNOSIS — K921 Melena: Secondary | ICD-10-CM | POA: Diagnosis not present

## 2019-02-27 DIAGNOSIS — I509 Heart failure, unspecified: Secondary | ICD-10-CM | POA: Diagnosis not present

## 2019-02-27 DIAGNOSIS — D649 Anemia, unspecified: Secondary | ICD-10-CM | POA: Diagnosis not present

## 2019-02-27 DIAGNOSIS — E872 Acidosis: Secondary | ICD-10-CM | POA: Diagnosis not present

## 2019-02-28 ENCOUNTER — Other Ambulatory Visit: Payer: Self-pay | Admitting: *Deleted

## 2019-02-28 DIAGNOSIS — N183 Chronic kidney disease, stage 3 (moderate): Secondary | ICD-10-CM | POA: Diagnosis not present

## 2019-02-28 DIAGNOSIS — R262 Difficulty in walking, not elsewhere classified: Secondary | ICD-10-CM | POA: Diagnosis not present

## 2019-02-28 DIAGNOSIS — I2510 Atherosclerotic heart disease of native coronary artery without angina pectoris: Secondary | ICD-10-CM | POA: Diagnosis not present

## 2019-02-28 DIAGNOSIS — I5023 Acute on chronic systolic (congestive) heart failure: Secondary | ICD-10-CM | POA: Diagnosis not present

## 2019-02-28 NOTE — Patient Outreach (Addendum)
[facility]) Care Management  02/28/2019  [name] 1950-12-30 [id number]   Per PatientPing this patient admitted to skilled facility. Noted [name] is listed to be followed by [facility] in the community post SNF discharge.  [facility] will provide full case management services.   [facility] Care Management services are not appropriate at this time.   Plan to sign off. Will collaborate with [facility] as indicated.    Marthenia Rolling, MSN-Ed, RN,BSN [place] Acute Care Coordinator [phone]

## 2019-03-04 DIAGNOSIS — L97513 Non-pressure chronic ulcer of other part of right foot with necrosis of muscle: Secondary | ICD-10-CM | POA: Diagnosis not present

## 2019-03-11 DIAGNOSIS — L97513 Non-pressure chronic ulcer of other part of right foot with necrosis of muscle: Secondary | ICD-10-CM | POA: Diagnosis not present

## 2019-03-16 ENCOUNTER — Other Ambulatory Visit: Payer: Self-pay

## 2019-03-16 ENCOUNTER — Inpatient Hospital Stay (HOSPITAL_COMMUNITY): Payer: PPO

## 2019-03-16 ENCOUNTER — Encounter (HOSPITAL_COMMUNITY): Payer: Self-pay | Admitting: Internal Medicine

## 2019-03-16 ENCOUNTER — Inpatient Hospital Stay (HOSPITAL_COMMUNITY)
Admission: AD | Admit: 2019-03-16 | Discharge: 2019-03-27 | DRG: 377 | Disposition: A | Discharge status: Skilled Nursing Facility | Payer: PPO | Source: Other Acute Inpatient Hospital | Attending: Internal Medicine | Admitting: Internal Medicine

## 2019-03-16 DIAGNOSIS — S299XXA Unspecified injury of thorax, initial encounter: Secondary | ICD-10-CM | POA: Diagnosis not present

## 2019-03-16 DIAGNOSIS — K269 Duodenal ulcer, unspecified as acute or chronic, without hemorrhage or perforation: Secondary | ICD-10-CM | POA: Diagnosis not present

## 2019-03-16 DIAGNOSIS — E872 Acidosis: Secondary | ICD-10-CM | POA: Diagnosis not present

## 2019-03-16 DIAGNOSIS — R0609 Other forms of dyspnea: Secondary | ICD-10-CM | POA: Diagnosis not present

## 2019-03-16 DIAGNOSIS — K264 Chronic or unspecified duodenal ulcer with hemorrhage: Principal | ICD-10-CM | POA: Diagnosis present

## 2019-03-16 DIAGNOSIS — E1140 Type 2 diabetes mellitus with diabetic neuropathy, unspecified: Secondary | ICD-10-CM | POA: Diagnosis present

## 2019-03-16 DIAGNOSIS — D696 Thrombocytopenia, unspecified: Secondary | ICD-10-CM | POA: Diagnosis present

## 2019-03-16 DIAGNOSIS — K921 Melena: Secondary | ICD-10-CM | POA: Diagnosis not present

## 2019-03-16 DIAGNOSIS — K259 Gastric ulcer, unspecified as acute or chronic, without hemorrhage or perforation: Secondary | ICD-10-CM | POA: Diagnosis not present

## 2019-03-16 DIAGNOSIS — N139 Obstructive and reflux uropathy, unspecified: Secondary | ICD-10-CM | POA: Diagnosis present

## 2019-03-16 DIAGNOSIS — R0602 Shortness of breath: Secondary | ICD-10-CM | POA: Diagnosis not present

## 2019-03-16 DIAGNOSIS — D62 Acute posthemorrhagic anemia: Secondary | ICD-10-CM | POA: Diagnosis not present

## 2019-03-16 DIAGNOSIS — F0390 Unspecified dementia without behavioral disturbance: Secondary | ICD-10-CM | POA: Diagnosis not present

## 2019-03-16 DIAGNOSIS — G4733 Obstructive sleep apnea (adult) (pediatric): Secondary | ICD-10-CM

## 2019-03-16 DIAGNOSIS — E559 Vitamin D deficiency, unspecified: Secondary | ICD-10-CM | POA: Diagnosis not present

## 2019-03-16 DIAGNOSIS — Z9119 Patient's noncompliance with other medical treatment and regimen: Secondary | ICD-10-CM | POA: Diagnosis not present

## 2019-03-16 DIAGNOSIS — I130 Hypertensive heart and chronic kidney disease with heart failure and stage 1 through stage 4 chronic kidney disease, or unspecified chronic kidney disease: Secondary | ICD-10-CM | POA: Diagnosis not present

## 2019-03-16 DIAGNOSIS — I6521 Occlusion and stenosis of right carotid artery: Secondary | ICD-10-CM | POA: Diagnosis not present

## 2019-03-16 DIAGNOSIS — Z66 Do not resuscitate: Secondary | ICD-10-CM | POA: Diagnosis not present

## 2019-03-16 DIAGNOSIS — I493 Ventricular premature depolarization: Secondary | ICD-10-CM

## 2019-03-16 DIAGNOSIS — E1122 Type 2 diabetes mellitus with diabetic chronic kidney disease: Secondary | ICD-10-CM | POA: Diagnosis present

## 2019-03-16 DIAGNOSIS — I428 Other cardiomyopathies: Secondary | ICD-10-CM | POA: Diagnosis present

## 2019-03-16 DIAGNOSIS — E1151 Type 2 diabetes mellitus with diabetic peripheral angiopathy without gangrene: Secondary | ICD-10-CM | POA: Diagnosis present

## 2019-03-16 DIAGNOSIS — K228 Other specified diseases of esophagus: Secondary | ICD-10-CM | POA: Diagnosis not present

## 2019-03-16 DIAGNOSIS — E785 Hyperlipidemia, unspecified: Secondary | ICD-10-CM | POA: Diagnosis present

## 2019-03-16 DIAGNOSIS — M816 Localized osteoporosis [Lequesne]: Secondary | ICD-10-CM | POA: Diagnosis not present

## 2019-03-16 DIAGNOSIS — J069 Acute upper respiratory infection, unspecified: Secondary | ICD-10-CM | POA: Diagnosis not present

## 2019-03-16 DIAGNOSIS — Z79899 Other long term (current) drug therapy: Secondary | ICD-10-CM | POA: Diagnosis not present

## 2019-03-16 DIAGNOSIS — M2550 Pain in unspecified joint: Secondary | ICD-10-CM | POA: Diagnosis not present

## 2019-03-16 DIAGNOSIS — N390 Urinary tract infection, site not specified: Secondary | ICD-10-CM | POA: Diagnosis not present

## 2019-03-16 DIAGNOSIS — Z87891 Personal history of nicotine dependence: Secondary | ICD-10-CM | POA: Diagnosis not present

## 2019-03-16 DIAGNOSIS — K5731 Diverticulosis of large intestine without perforation or abscess with bleeding: Secondary | ICD-10-CM

## 2019-03-16 DIAGNOSIS — E11649 Type 2 diabetes mellitus with hypoglycemia without coma: Secondary | ICD-10-CM | POA: Diagnosis not present

## 2019-03-16 DIAGNOSIS — R7301 Impaired fasting glucose: Secondary | ICD-10-CM | POA: Diagnosis not present

## 2019-03-16 DIAGNOSIS — D649 Anemia, unspecified: Secondary | ICD-10-CM | POA: Diagnosis not present

## 2019-03-16 DIAGNOSIS — K3189 Other diseases of stomach and duodenum: Secondary | ICD-10-CM | POA: Diagnosis not present

## 2019-03-16 DIAGNOSIS — Z515 Encounter for palliative care: Secondary | ICD-10-CM

## 2019-03-16 DIAGNOSIS — E538 Deficiency of other specified B group vitamins: Secondary | ICD-10-CM | POA: Diagnosis not present

## 2019-03-16 DIAGNOSIS — I5043 Acute on chronic combined systolic (congestive) and diastolic (congestive) heart failure: Secondary | ICD-10-CM | POA: Diagnosis not present

## 2019-03-16 DIAGNOSIS — I959 Hypotension, unspecified: Secondary | ICD-10-CM | POA: Diagnosis not present

## 2019-03-16 DIAGNOSIS — Z7189 Other specified counseling: Secondary | ICD-10-CM | POA: Diagnosis not present

## 2019-03-16 DIAGNOSIS — I10 Essential (primary) hypertension: Secondary | ICD-10-CM | POA: Diagnosis present

## 2019-03-16 DIAGNOSIS — K449 Diaphragmatic hernia without obstruction or gangrene: Secondary | ICD-10-CM | POA: Diagnosis present

## 2019-03-16 DIAGNOSIS — B952 Enterococcus as the cause of diseases classified elsewhere: Secondary | ICD-10-CM | POA: Diagnosis present

## 2019-03-16 DIAGNOSIS — R0789 Other chest pain: Secondary | ICD-10-CM | POA: Diagnosis not present

## 2019-03-16 DIAGNOSIS — L89810 Pressure ulcer of head, unstageable: Secondary | ICD-10-CM | POA: Diagnosis present

## 2019-03-16 DIAGNOSIS — N200 Calculus of kidney: Secondary | ICD-10-CM | POA: Diagnosis not present

## 2019-03-16 DIAGNOSIS — E861 Hypovolemia: Secondary | ICD-10-CM | POA: Diagnosis present

## 2019-03-16 DIAGNOSIS — N401 Enlarged prostate with lower urinary tract symptoms: Secondary | ICD-10-CM | POA: Diagnosis present

## 2019-03-16 DIAGNOSIS — K319 Disease of stomach and duodenum, unspecified: Secondary | ICD-10-CM | POA: Diagnosis not present

## 2019-03-16 DIAGNOSIS — I480 Paroxysmal atrial fibrillation: Secondary | ICD-10-CM | POA: Diagnosis not present

## 2019-03-16 DIAGNOSIS — I361 Nonrheumatic tricuspid (valve) insufficiency: Secondary | ICD-10-CM | POA: Diagnosis not present

## 2019-03-16 DIAGNOSIS — R0781 Pleurodynia: Secondary | ICD-10-CM | POA: Diagnosis not present

## 2019-03-16 DIAGNOSIS — R799 Abnormal finding of blood chemistry, unspecified: Secondary | ICD-10-CM | POA: Diagnosis not present

## 2019-03-16 DIAGNOSIS — I252 Old myocardial infarction: Secondary | ICD-10-CM

## 2019-03-16 DIAGNOSIS — I5023 Acute on chronic systolic (congestive) heart failure: Secondary | ICD-10-CM

## 2019-03-16 DIAGNOSIS — I5022 Chronic systolic (congestive) heart failure: Secondary | ICD-10-CM | POA: Diagnosis not present

## 2019-03-16 DIAGNOSIS — Z8673 Personal history of transient ischemic attack (TIA), and cerebral infarction without residual deficits: Secondary | ICD-10-CM | POA: Diagnosis not present

## 2019-03-16 DIAGNOSIS — Z7982 Long term (current) use of aspirin: Secondary | ICD-10-CM

## 2019-03-16 DIAGNOSIS — N183 Chronic kidney disease, stage 3 unspecified: Secondary | ICD-10-CM | POA: Diagnosis present

## 2019-03-16 DIAGNOSIS — R0902 Hypoxemia: Secondary | ICD-10-CM | POA: Diagnosis not present

## 2019-03-16 DIAGNOSIS — E876 Hypokalemia: Secondary | ICD-10-CM | POA: Diagnosis not present

## 2019-03-16 DIAGNOSIS — I509 Heart failure, unspecified: Secondary | ICD-10-CM | POA: Diagnosis not present

## 2019-03-16 DIAGNOSIS — M069 Rheumatoid arthritis, unspecified: Secondary | ICD-10-CM | POA: Diagnosis not present

## 2019-03-16 DIAGNOSIS — Z794 Long term (current) use of insulin: Secondary | ICD-10-CM | POA: Diagnosis not present

## 2019-03-16 DIAGNOSIS — I340 Nonrheumatic mitral (valve) insufficiency: Secondary | ICD-10-CM

## 2019-03-16 DIAGNOSIS — Z89429 Acquired absence of other toe(s), unspecified side: Secondary | ICD-10-CM | POA: Diagnosis not present

## 2019-03-16 DIAGNOSIS — I2510 Atherosclerotic heart disease of native coronary artery without angina pectoris: Secondary | ICD-10-CM | POA: Diagnosis present

## 2019-03-16 DIAGNOSIS — N138 Other obstructive and reflux uropathy: Secondary | ICD-10-CM | POA: Diagnosis not present

## 2019-03-16 DIAGNOSIS — N170 Acute kidney failure with tubular necrosis: Secondary | ICD-10-CM | POA: Diagnosis present

## 2019-03-16 DIAGNOSIS — R5383 Other fatigue: Secondary | ICD-10-CM | POA: Diagnosis not present

## 2019-03-16 DIAGNOSIS — K209 Esophagitis, unspecified: Secondary | ICD-10-CM | POA: Diagnosis not present

## 2019-03-16 DIAGNOSIS — R5381 Other malaise: Secondary | ICD-10-CM | POA: Diagnosis not present

## 2019-03-16 DIAGNOSIS — K2971 Gastritis, unspecified, with bleeding: Secondary | ICD-10-CM | POA: Diagnosis present

## 2019-03-16 DIAGNOSIS — E7800 Pure hypercholesterolemia, unspecified: Secondary | ICD-10-CM | POA: Diagnosis not present

## 2019-03-16 DIAGNOSIS — N179 Acute kidney failure, unspecified: Secondary | ICD-10-CM

## 2019-03-16 DIAGNOSIS — R52 Pain, unspecified: Secondary | ICD-10-CM | POA: Diagnosis not present

## 2019-03-16 DIAGNOSIS — Z7401 Bed confinement status: Secondary | ICD-10-CM | POA: Diagnosis not present

## 2019-03-16 DIAGNOSIS — L8990 Pressure ulcer of unspecified site, unspecified stage: Secondary | ICD-10-CM

## 2019-03-16 DIAGNOSIS — R809 Proteinuria, unspecified: Secondary | ICD-10-CM | POA: Diagnosis not present

## 2019-03-16 DIAGNOSIS — R58 Hemorrhage, not elsewhere classified: Secondary | ICD-10-CM | POA: Diagnosis not present

## 2019-03-16 DIAGNOSIS — Z1231 Encounter for screening mammogram for malignant neoplasm of breast: Secondary | ICD-10-CM | POA: Diagnosis not present

## 2019-03-16 DIAGNOSIS — K922 Gastrointestinal hemorrhage, unspecified: Secondary | ICD-10-CM | POA: Diagnosis not present

## 2019-03-16 DIAGNOSIS — K5791 Diverticulosis of intestine, part unspecified, without perforation or abscess with bleeding: Secondary | ICD-10-CM | POA: Diagnosis present

## 2019-03-16 DIAGNOSIS — K2270 Barrett's esophagus without dysplasia: Secondary | ICD-10-CM | POA: Diagnosis present

## 2019-03-16 DIAGNOSIS — E6609 Other obesity due to excess calories: Secondary | ICD-10-CM | POA: Diagnosis not present

## 2019-03-16 DIAGNOSIS — N182 Chronic kidney disease, stage 2 (mild): Secondary | ICD-10-CM | POA: Diagnosis not present

## 2019-03-16 DIAGNOSIS — I129 Hypertensive chronic kidney disease with stage 1 through stage 4 chronic kidney disease, or unspecified chronic kidney disease: Secondary | ICD-10-CM | POA: Diagnosis not present

## 2019-03-16 DIAGNOSIS — Z9989 Dependence on other enabling machines and devices: Secondary | ICD-10-CM

## 2019-03-16 DIAGNOSIS — F05 Delirium due to known physiological condition: Secondary | ICD-10-CM | POA: Diagnosis not present

## 2019-03-16 DIAGNOSIS — R404 Transient alteration of awareness: Secondary | ICD-10-CM | POA: Diagnosis not present

## 2019-03-16 LAB — GLUCOSE, CAPILLARY
Glucose-Capillary: 84 mg/dL (ref 70–99)
Glucose-Capillary: 124 mg/dL — ABNORMAL HIGH (ref 70–99)
Glucose-Capillary: 152 mg/dL — ABNORMAL HIGH (ref 70–99)

## 2019-03-16 LAB — HEMOGLOBIN
Hemoglobin: 12.2 g/dL — ABNORMAL LOW (ref 13.0–17.0)
Hemoglobin: 9.6 g/dL — ABNORMAL LOW (ref 13.0–17.0)

## 2019-03-16 LAB — ECHOCARDIOGRAM LIMITED

## 2019-03-16 LAB — HEMATOCRIT
HCT: 37 % — ABNORMAL LOW (ref 39.0–52.0)
HCT: 29.2 % — ABNORMAL LOW (ref 39.0–52.0)

## 2019-03-16 LAB — MRSA PCR SCREENING: MRSA by PCR: NEGATIVE

## 2019-03-16 LAB — HEMOGLOBIN A1C
Hgb A1c MFr Bld: 6.8 % — ABNORMAL HIGH (ref 4.8–5.6)
Mean Plasma Glucose: 148.46 mg/dL

## 2019-03-16 MED ORDER — SODIUM CHLORIDE 0.9 % IV SOLN: 250.0000 mL | INTRAVENOUS | Status: DC | PRN

## 2019-03-16 MED ORDER — INSULIN GLARGINE 100 UNIT/ML ~~LOC~~ SOLN
12.0000 [IU] | Freq: Every day | SUBCUTANEOUS | Status: DC
  Administered 2019-03-17 – 2019-03-19 (×3): 12 [IU] via SUBCUTANEOUS
  Filled 2019-03-16 (×6): qty 0.12

## 2019-03-16 MED ORDER — SODIUM CHLORIDE 0.9% FLUSH
3.0000 mL | Freq: Two times a day (BID) | INTRAVENOUS | Status: DC
  Administered 2019-03-16 – 2019-03-22 (×8): 3 mL via INTRAVENOUS

## 2019-03-16 MED ORDER — ACETAMINOPHEN 325 MG PO TABS
650.0000 mg | ORAL_TABLET | Freq: Four times a day (QID) | ORAL | Status: DC | PRN
  Administered 2019-03-21 – 2019-03-22 (×2): 650 mg via ORAL
  Filled 2019-03-16 (×2): qty 2

## 2019-03-16 MED ORDER — SODIUM CHLORIDE 0.9% FLUSH
3.0000 mL | Freq: Two times a day (BID) | INTRAVENOUS | Status: DC
  Administered 2019-03-16 – 2019-03-21 (×6): 3 mL via INTRAVENOUS

## 2019-03-16 MED ORDER — TRAMADOL HCL 50 MG PO TABS
50.0000 mg | ORAL_TABLET | Freq: Four times a day (QID) | ORAL | Status: DC | PRN
  Administered 2019-03-22 (×3): 50 mg via ORAL
  Filled 2019-03-16 (×3): qty 1

## 2019-03-16 MED ORDER — ATORVASTATIN CALCIUM 80 MG PO TABS
80.0000 mg | ORAL_TABLET | Freq: Every day | ORAL | Status: DC
  Administered 2019-03-16 – 2019-03-26 (×11): 80 mg via ORAL
  Filled 2019-03-16 (×11): qty 1

## 2019-03-16 MED ORDER — SODIUM CHLORIDE 0.9% FLUSH
3.0000 mL | INTRAVENOUS | Status: DC | PRN
  Administered 2019-03-24: 3 mL via INTRAVENOUS
  Filled 2019-03-16: qty 3

## 2019-03-16 MED ORDER — ONDANSETRON HCL 4 MG PO TABS: 4.0000 mg | ORAL_TABLET | Freq: Four times a day (QID) | ORAL | Status: DC | PRN

## 2019-03-16 MED ORDER — INSULIN ASPART 100 UNIT/ML ~~LOC~~ SOLN
0.0000 [IU] | Freq: Three times a day (TID) | SUBCUTANEOUS | Status: DC
  Administered 2019-03-16: 2 [IU] via SUBCUTANEOUS
  Administered 2019-03-18 – 2019-03-20 (×4): 3 [IU] via SUBCUTANEOUS
  Administered 2019-03-21: 2 [IU] via SUBCUTANEOUS
  Administered 2019-03-21 – 2019-03-22 (×3): 1 [IU] via SUBCUTANEOUS
  Administered 2019-03-24: 17:00:00 3 [IU] via SUBCUTANEOUS
  Administered 2019-03-25 – 2019-03-26 (×2): 2 [IU] via SUBCUTANEOUS
  Administered 2019-03-26: 12:00:00 3 [IU] via SUBCUTANEOUS

## 2019-03-16 MED ORDER — SODIUM CHLORIDE 0.9 % IV SOLN
80.0000 mg | Freq: Two times a day (BID) | INTRAVENOUS | Status: DC
  Administered 2019-03-16 – 2019-03-17 (×2): 80 mg via INTRAVENOUS
  Filled 2019-03-16 (×4): qty 80

## 2019-03-16 MED ORDER — CARVEDILOL 3.125 MG PO TABS
3.1250 mg | ORAL_TABLET | Freq: Two times a day (BID) | ORAL | Status: DC
  Administered 2019-03-16 – 2019-03-19 (×6): 3.125 mg via ORAL
  Filled 2019-03-16 (×6): qty 1

## 2019-03-16 MED ORDER — ONDANSETRON HCL 4 MG/2ML IJ SOLN: 4.0000 mg | Freq: Four times a day (QID) | INTRAMUSCULAR | Status: DC | PRN

## 2019-03-16 MED ORDER — FUROSEMIDE 40 MG PO TABS
40.0000 mg | ORAL_TABLET | Freq: Two times a day (BID) | ORAL | Status: DC
  Administered 2019-03-16 – 2019-03-18 (×4): 40 mg via ORAL
  Filled 2019-03-16 (×4): qty 1

## 2019-03-16 MED ORDER — ACETAMINOPHEN 650 MG RE SUPP: 650.0000 mg | Freq: Four times a day (QID) | RECTAL | Status: DC | PRN

## 2019-03-16 MED ORDER — AMIODARONE HCL 200 MG PO TABS
200.0000 mg | ORAL_TABLET | Freq: Every day | ORAL | Status: DC
  Administered 2019-03-16 – 2019-03-24 (×9): 200 mg via ORAL
  Filled 2019-03-16 (×9): qty 1

## 2019-03-16 NOTE — H&P (Signed)
History and Physical    Aaron Mclean BMW:[id number] DOB: [birth year] DOA: 03/16/2019  PCP: Annamarie Major, MD Patient coming from: [facility] but is a resident at [facility] facility in [place]  I have personally briefly reviewed patient's old medical records in [place]  Chief Complaint: Bloody stool noted at rounds at facility  HPI: Aaron Mclean is a 68 y.o. male with medical history significant of diabetes type 2 uncontrolled with peripheral vascular disease status post toe amputations, atrial fibrillation, dementia, chronic kidney disease stage III, struct of sleep apnea on CPAP, hypertension, dyslipidemia and congestive heart failure with an ejection fraction of 10 to 15% thought worsened by PVCs.  Patient was last discharged from our facility in December 2019 when he was placed on amiodarone in hopes that they could suppress his PVCs and improve his ejection fraction.  Follow-up was planned but patient was lost to follow-up when he entered [facility] nursing facility in [place].  Area early this morning patient complained of dark stools at the facility and was transferred to [facility].  He had no vomiting no diarrhea and no constipation.  Was not anticoagulated.  He 16 patient had a colonoscopy which demonstrated diverticulosis and internal hemorrhoids. She had one maroon-colored stool at the nursing home and then one large maroon-colored stool fecal occult blood positive in the emergency department.  Is hemodynamically stable with a hemoglobin of 12.7 today previously 15.4 at their facility and 12.8 at our facility 3 months ago.  His BUN and creatinine is 70/2 and BUN is slightly elevated at his baseline.  Was accepted for transfer and recommended 80 mg of IV Protonix to be given.  There is no GI coverage at [facility] and that is why patient needs to come to our facility.  Will be placed here for observation.  If hemoglobin continues to drop we will consider GI consultation.  3 is limited by the patient's dementia.  He states that he was living at the nursing home because people put metals overseas in him.  Review of Systems: Level 5 caveat for dementia  Past Medical History:  Diagnosis Date  . A-fib ([place])   . Arthritis   . CKD (chronic kidney disease)   . Constipation   . Diabetes mellitus without complication ([place])   . Dysrhythmia    a-fib  - 3 episodes first of 2018. None since starting Bystolic, (01/18/24 [name] OV notes patient reported home episodes where heart rate felt irregular. Had PACs, PVCs on EKG.)  . Hemorrhoid    internal  . Hypertension   . Neuropathy   . Sleep apnea    can't use cpap    Past Surgical History:  Procedure Laterality Date  . CARDIOVASCULAR STRESS TEST     05/01/17 [facility]): No ischemia. Fixed defect in the inferior segment most likely from scar from old MI. Marked hypokinetic inferior segment of LV with EF 37%.   . EXOSTECTECTOMY TOE Right 07/02/2017   Procedure: TORSAL EXOSTECTOMY FIFTH METARSAL BASE WOUND DEBRIDMENT AND GRAFT APPLICATION;  Surgeon: [name], DPM;  Location: [place];  Service: Podiatry;  Laterality: Right;  . FOOT TENDON SURGERY Right    spur removed  . GRAFT APPLICATION Right 3/66/4403   Procedure: GRAFT APPLICATION;  Surgeon: [name], DPM;  Location: [place];  Service: Podiatry;  Laterality: Right;  . RIGHT/LEFT HEART CATH AND CORONARY ANGIOGRAPHY N/A 12/02/2018   Procedure: RIGHT/LEFT HEART CATH AND CORONARY ANGIOGRAPHY;  Surgeon: Jolaine Artist, MD;  Location: [place] [facility];  Service: Cardiovascular;  Laterality: N/A;  . ROTATOR CUFF REPAIR Right   . WOUND DEBRIDEMENT Right 07/02/2017   Procedure: DEBRIDEMENT WOUND;  Surgeon: [name], DPM;  Location: [place];  Service: Podiatry;  Laterality: Right;    Social History   Social History Narrative  . Not on file     reports that he has quit smoking. His smoking use included cigars. He has never used smokeless  tobacco. He reports that he does not drink alcohol or use drugs.  Allergies  Allergen Reactions  . Sertraline Rash    Family History  Problem Relation Age of Onset  . Alzheimer's disease Mother   . Congestive Heart Failure Mother   . Cancer Father      Prior to Admission medications   Medication Sig Start Date End Date Taking? Authorizing Provider  amiodarone (PACERONE) 400 MG tablet Take 1 tablet (400 mg total) by mouth 2 (two) times daily. 12/03/18   Hosie Poisson, MD  aspirin EC 81 MG tablet Take 1 tablet (81 mg total) by mouth daily. 12/03/18   Hosie Poisson, MD  atorvastatin (LIPITOR) 80 MG tablet Take 1 tablet (80 mg total) by mouth daily. 12/04/18   Hosie Poisson, MD  carvedilol (COREG) 3.125 MG tablet Take 1 tablet (3.125 mg total) by mouth 2 (two) times daily with a meal. 12/03/18   Hosie Poisson, MD  docusate sodium (COLACE) 100 MG capsule Take 1 capsule (100 mg total) by mouth 2 (two) times daily. 12/03/18   Hosie Poisson, MD  furosemide (LASIX) 40 MG tablet Take 1 tablet (40 mg total) by mouth daily as needed for fluid or edema. 12/03/18 02/01/19  Hosie Poisson, MD  Insulin Degludec-Liraglutide (XULTOPHY Delia) Inject into the skin. Samples, pt unable to provide dose.    [provider]  liraglutide (VICTOZA) 18 MG/3ML SOPN Inject 1.2 mg into the skin daily.    [provider]  losartan (COZAAR) 25 MG tablet Take 1 tablet (25 mg total) by mouth daily. 12/04/18   Hosie Poisson, MD  pantoprazole (PROTONIX) 40 MG tablet Take 1 tablet (40 mg total) by mouth daily. 12/04/18   Hosie Poisson, MD  spironolactone (ALDACTONE) 25 MG tablet Take 0.5 tablets (12.5 mg total) by mouth at bedtime. 12/03/18   Hosie Poisson, MD    Physical Exam:  Constitutional: NAD, calm, comfortable Temperature 98.8, pulse 65, respirations 18, blood pressure 116/71, pulse ox 94% on room air, these were obtained this [facility] at [facility]. Eyes: PERRL, lids and conjunctivae normal  ENMT: Mucous membranes are moist. Posterior pharynx clear of any exudate or lesions.Normal dentition.  Neck: normal, supple, no masses, no thyromegaly Respiratory: clear to auscultation bilaterally, no wheezing, no crackles. Normal respiratory effort. No accessory muscle use.  Cardiovascular: Regular rate and rhythm, no murmurs / rubs / gallops. No extremity edema. 2+ pedal pulses. No carotid bruits.  Abdomen: no tenderness, no masses palpated. No hepatosplenomegaly. Bowel sounds positive.  Musculoskeletal: no clubbing / cyanosis. No joint deformity upper and lower extremities. Good ROM, no contractures. Normal muscle tone.  Right toe amputations noted Skin: no rashes, lesions, ulcers. No induration Neurologic: CN 2-12 grossly intact. Sensation intact, DTR normal. Strength 5/5 in all 4.  Psychiatric: Poor judgment and insight. Alert and oriented x 3. Normal mood.  Exhibits mild paranoia   Labs on Admission: I have personally reviewed following labs and imaging studies  Laboratory data from [facility]: [facility] blood cell 7.6, hemoglobin 12.7, hematocrit 38.7,  platelet count 164, BMP: Sodium 135, potassium 4, chloride 101, CO2 27, BUN 72, creatinine 2, glucose 89.  AST 62, GFR 33, protein 5.0, albumin 2.3, PT 15, INR 1.5, PTT 31.3, blood type a positive   EKG: Pending  Assessment/Plan Principal Problem:   Diverticulosis of intestine with bleeding Active Problems:   Chronic systolic CHF (congestive heart failure), NYHA class 4 (HCC)   DM (diabetes mellitus), type 2 with peripheral vascular complications (HCC)   CKD (chronic kidney disease) stage 3, GFR 30-59 ml/min (HCC)   Benign prostatic hyperplasia with urinary obstruction   OSA on CPAP   Essential hypertension   Dyslipidemia   1.  Diverticulosis of intestine with bleeding: Patient will be admitted into the hospital we will give him clear liquids but no red ones.  Will monitor fluid intake given his profound congestive heart  failure.  Will check serial H&H's every 6 hours and if hemoglobin continues to drop will consider GI consultation.  Currently patient without evidence of abdominal pain and doubt acute diverticulitis.  2.  Chronic systolic congestive heart failure New York Heart Association class IV: Cardiac catheterization in December 2020 revealed severe multivessel coronary disease with mixed ischemic and nonischemic cardiomyopathy with an EF of 15 to 20%.  He had three-vessel coronary artery disease.  He had moderately elevated filling pressures with moderately reduced cardiac output.  She had been started on amiodarone to suppress PVCs because there was a suspicion that he had severe PVC induced cardiomyopathy.  The plan was to recheck EF in the near future and therefore I have ordered an echocardiogram as no repeat echocardiogram has been performed.  Patient was lost to follow-up when admitted to the nursing facility.  3.  Diabetes type 2 with peripheral vascular complications: Patient uses insulin glargine every morning I have ordered that we will also check fingerstick blood glucoses before meals and at bedtime and provide sliding scale insulin coverage.  4.  Chronic kidney disease stage III with a GFR of 30-59: Patient's current GFR is 33 and is concerning.  Will monitor creatinine and avoid nephrotoxic agents.  5.  Benign prostatic hyperplasia with urinary obstruction: Last hospitalization patient did require placement of a Foley catheter.  Will check bladder scans as needed if necessary insert Foley catheter.  6.  Obstructive sleep apnea on CPAP: We will order CPAP as per home regimen I do not have records that indicate how much CPAP he was on at the facility.  We will ask respiratory care to call collapse and check on that and start patient on CPAP here.  7.  Essential hypertension: Continue home medication regimen which includes Coreg.  8.  Dyslipidemia continue atorvastatin.  DVT prophylaxis: SCDs Code  Status: Full code Family Communication: Arnol Mcgibbon [phone]  [facility] with number for nsg unit; tried daughter, Leana Roe [phone] mailbox full  Disposition Plan: Back to [facility] nursing facility in [place] once stable Consults called: None yet Admission status: Inpatient   Lady Deutscher MD FACP [facility] Pager [phone]  How to contact the [facility] Attending or Consulting provider [place] or covering provider during after hours [place], for this patient?  1. Check the care team in [facility] and look for a) attending/consulting [facility] provider listed and b) the [facility] team listed 2. Log into [email or web address] and use [place]'s universal password to access. If you do not have the password, please contact the hospital operator. 3. Locate the [facility] provider you are looking for under [facility] and page to a number that you can be directly reached. 4. If you still have difficulty reaching the provider, please page the [facility] (Director on Call) for the Hospitalists listed on amion for assistance.  If 7PM-7AM, please contact night-coverage [email or web address] Password [email or web address]  03/16/2019, 1:14 PM

## 2019-03-16 NOTE — Progress Notes (Signed)
Pt placed on CPAP per physician orders.  Wound/breakdown noted on bridge of pt's nose.  Gel barrier placed between mask and noted breakdown area.  Pt denies any pain/discomfort with mask fit.  Will cont to monitor.

## 2019-03-16 NOTE — Progress Notes (Signed)
  Echocardiogram 2D Echocardiogram has been performed.  Johny Chess 03/16/2019, 3:00 PM

## 2019-03-16 NOTE — Progress Notes (Signed)
Patient was admitted from [facility]. The number that we had on file for [name] mother did not work.   CSW called [facility] to get updated contact information for any living relatives for [name]. He has two children, Aaron Mclean, daughter, [phone] and Aaron Mclean, son, [phone].   CSW attempted to call and speak with [name] daughter, her voicemail box was full. CSW called the patient's son left a message.   CSW is awaiting a return phone call.   Domenic Schwab, MSW, [place]

## 2019-03-16 NOTE — Progress Notes (Signed)
Bladder scan done-no residual.

## 2019-03-16 NOTE — Progress Notes (Signed)
R.N. called patient's son,made him aware that his father wanted to speak to him.Patient 's room phone number provided to his son .

## 2019-03-17 ENCOUNTER — Inpatient Hospital Stay (HOSPITAL_COMMUNITY): Payer: PPO | Admitting: Certified Registered Nurse Anesthetist

## 2019-03-17 ENCOUNTER — Encounter (HOSPITAL_COMMUNITY): Payer: Self-pay | Admitting: *Deleted

## 2019-03-17 ENCOUNTER — Encounter (HOSPITAL_COMMUNITY)
Admission: AD | Disposition: A | Discharge status: Skilled Nursing Facility | Payer: Self-pay | Source: Other Acute Inpatient Hospital | Attending: Internal Medicine

## 2019-03-17 DIAGNOSIS — K922 Gastrointestinal hemorrhage, unspecified: Secondary | ICD-10-CM

## 2019-03-17 DIAGNOSIS — K264 Chronic or unspecified duodenal ulcer with hemorrhage: Principal | ICD-10-CM

## 2019-03-17 DIAGNOSIS — K259 Gastric ulcer, unspecified as acute or chronic, without hemorrhage or perforation: Secondary | ICD-10-CM

## 2019-03-17 DIAGNOSIS — I5022 Chronic systolic (congestive) heart failure: Secondary | ICD-10-CM

## 2019-03-17 DIAGNOSIS — L8990 Pressure ulcer of unspecified site, unspecified stage: Secondary | ICD-10-CM

## 2019-03-17 DIAGNOSIS — K921 Melena: Secondary | ICD-10-CM

## 2019-03-17 DIAGNOSIS — K209 Esophagitis, unspecified: Secondary | ICD-10-CM

## 2019-03-17 DIAGNOSIS — D62 Acute posthemorrhagic anemia: Secondary | ICD-10-CM

## 2019-03-17 DIAGNOSIS — F05 Delirium due to known physiological condition: Secondary | ICD-10-CM

## 2019-03-17 DIAGNOSIS — F0390 Unspecified dementia without behavioral disturbance: Secondary | ICD-10-CM

## 2019-03-17 DIAGNOSIS — R799 Abnormal finding of blood chemistry, unspecified: Secondary | ICD-10-CM

## 2019-03-17 HISTORY — PX: SCLEROTHERAPY: SHX6841

## 2019-03-17 HISTORY — PX: HOT HEMOSTASIS: SHX5433

## 2019-03-17 HISTORY — PX: HEMOSTASIS CLIP PLACEMENT: SHX6857

## 2019-03-17 HISTORY — PX: ESOPHAGOGASTRODUODENOSCOPY (EGD) WITH PROPOFOL: SHX5813

## 2019-03-17 HISTORY — PX: BIOPSY: SHX5522

## 2019-03-17 LAB — BASIC METABOLIC PANEL
Anion gap: 11 (ref 5–15)
BUN: 76 mg/dL — ABNORMAL HIGH (ref 8–23)
CO2: 24 mmol/L (ref 22–32)
Calcium: 7.9 mg/dL — ABNORMAL LOW (ref 8.9–10.3)
Chloride: 99 mmol/L (ref 98–111)
Creatinine, Ser: 2.29 mg/dL — ABNORMAL HIGH (ref 0.61–1.24)
GFR calc Af Amer: 33 mL/min — ABNORMAL LOW (ref >60)
GFR calc non Af Amer: 28 mL/min — ABNORMAL LOW (ref >60)
Glucose, Bld: 127 mg/dL — ABNORMAL HIGH (ref 70–99)
Potassium: 3.2 mmol/L — ABNORMAL LOW (ref 3.5–5.1)
Sodium: 134 mmol/L — ABNORMAL LOW (ref 135–145)

## 2019-03-17 LAB — HEMOGLOBIN
Hemoglobin: 9 g/dL — ABNORMAL LOW (ref 13.0–17.0)
Hemoglobin: 7.8 g/dL — ABNORMAL LOW (ref 13.0–17.0)

## 2019-03-17 LAB — CBC
HCT: 25.3 % — ABNORMAL LOW (ref 39.0–52.0)
Hemoglobin: 8.5 g/dL — ABNORMAL LOW (ref 13.0–17.0)
MCH: 27.2 pg (ref 26.0–34.0)
MCHC: 33.6 g/dL (ref 30.0–36.0)
MCV: 81.1 fL (ref 80.0–100.0)
Platelets: 123 10*3/uL — ABNORMAL LOW (ref 150–400)
RBC: 3.12 MIL/uL — ABNORMAL LOW (ref 4.22–5.81)
RDW: 15.6 % — ABNORMAL HIGH (ref 11.5–15.5)
WBC: 7.3 10*3/uL (ref 4.0–10.5)
nRBC: 0 % (ref 0.0–0.2)

## 2019-03-17 LAB — GLUCOSE, CAPILLARY
Glucose-Capillary: 118 mg/dL — ABNORMAL HIGH (ref 70–99)
Glucose-Capillary: 145 mg/dL — ABNORMAL HIGH (ref 70–99)
Glucose-Capillary: 117 mg/dL — ABNORMAL HIGH (ref 70–99)
Glucose-Capillary: 133 mg/dL — ABNORMAL HIGH (ref 70–99)

## 2019-03-17 LAB — HEMATOCRIT: HCT: 27 % — ABNORMAL LOW (ref 39.0–52.0)

## 2019-03-17 LAB — MAGNESIUM: Magnesium: 1.5 mg/dL — ABNORMAL LOW (ref 1.7–2.4)

## 2019-03-17 SURGERY — ESOPHAGOGASTRODUODENOSCOPY (EGD) WITH PROPOFOL
Anesthesia: General

## 2019-03-17 MED ORDER — PANTOPRAZOLE SODIUM 40 MG IV SOLR
40.0000 mg | Freq: Two times a day (BID) | INTRAVENOUS | Status: AC
  Administered 2019-03-17 – 2019-03-20 (×6): 40 mg via INTRAVENOUS
  Filled 2019-03-17 (×6): qty 40

## 2019-03-17 MED ORDER — PHENYLEPHRINE HCL 10 MG/ML IJ SOLN
INTRAMUSCULAR | Status: DC | PRN
  Administered 2019-03-17 (×3): 120 ug via INTRAVENOUS

## 2019-03-17 MED ORDER — ONDANSETRON HCL 4 MG/2ML IJ SOLN
INTRAMUSCULAR | Status: DC | PRN
  Administered 2019-03-17: 4 mg via INTRAVENOUS

## 2019-03-17 MED ORDER — FENTANYL CITRATE (PF) 100 MCG/2ML IJ SOLN
INTRAMUSCULAR | Status: DC | PRN
  Administered 2019-03-17: 25 ug via INTRAVENOUS

## 2019-03-17 MED ORDER — LIDOCAINE 2% (20 MG/ML) 5 ML SYRINGE
INTRAMUSCULAR | Status: DC | PRN
  Administered 2019-03-17: 60 mg via INTRAVENOUS

## 2019-03-17 MED ORDER — SODIUM CHLORIDE 0.9 % IV SOLN
INTRAVENOUS | Status: DC
  Administered 2019-03-17: 13:00:00 via INTRAVENOUS

## 2019-03-17 MED ORDER — LACTATED RINGERS IV SOLN
INTRAVENOUS | Status: DC | PRN
  Administered 2019-03-17 (×2): via INTRAVENOUS

## 2019-03-17 MED ORDER — ETOMIDATE 2 MG/ML IV SOLN
INTRAVENOUS | Status: DC | PRN
  Administered 2019-03-17: 8 mg via INTRAVENOUS

## 2019-03-17 MED ORDER — SODIUM CHLORIDE (PF) 0.9 % IJ SOLN
PREFILLED_SYRINGE | INTRAMUSCULAR | Status: DC | PRN
  Administered 2019-03-17: 3.5 mL

## 2019-03-17 MED ORDER — EPHEDRINE SULFATE 50 MG/ML IJ SOLN
INTRAMUSCULAR | Status: DC | PRN
  Administered 2019-03-17 (×2): 10 mg via INTRAVENOUS

## 2019-03-17 MED ORDER — SUCCINYLCHOLINE CHLORIDE 20 MG/ML IJ SOLN
INTRAMUSCULAR | Status: DC | PRN
  Administered 2019-03-17: 80 mg via INTRAVENOUS

## 2019-03-17 SURGICAL SUPPLY — 14 items

## 2019-03-17 NOTE — Op Note (Signed)
[facility] Patient Name: Aaron Mclean Procedure Date : 03/17/2019 MRN: [id number] Attending MD: Justice Britain , MD Date of Birth: 11-10-1951 CSN: [id number] Age: 68 Admit Type: Inpatient Procedure:                Upper GI endoscopy Indications:              Diagnostic procedure, Acute post hemorrhagic                            anemia, Melena Providers:                Justice Britain, MD, Glori Bickers, RN, Carlyn Reichert, RN, Charolette Child, Technician, Rejeana Brock,                            CRNA Referring MD:             Dr. [name] ([facility]), PA Glyn Ade Medicines:                General Anesthesia Complications:            No immediate complications. Estimated Blood Loss:     Estimated blood loss was minimal. Procedure:                Pre-Anesthesia Assessment:                           - Prior to the procedure, a History and Physical                            was performed, and patient medications and                            allergies were reviewed. The patient's tolerance of                            previous anesthesia was also reviewed. The risks                            and benefits of the procedure and the sedation                            options and risks were discussed with the patient.                            All questions were answered, and informed consent                            was obtained. Prior Anticoagulants: The patient has                            taken no previous anticoagulant or antiplatelet                            agents.  ASA Grade Assessment: III - A patient with                            severe systemic disease. After reviewing the risks                            and benefits, the patient was deemed in                            satisfactory condition to undergo the procedure.                           After obtaining informed consent, the endoscope was                            passed  under direct vision. Throughout the                            procedure, the patient's blood pressure, pulse, and                            oxygen saturations were monitored continuously. The                            GIF-H190 ([id number]) Olympus gastroscope was                            introduced through the mouth, and advanced to the                            second part of duodenum. The upper GI endoscopy was                            accomplished without difficulty. The patient                            tolerated the procedure. Scope In: Scope Out: Findings:      No gross lesions were noted in the proximal esophagus and in the mid       esophagus.      LA Grade C (one or more mucosal breaks continuous between tops of 2 or       more mucosal folds, less than 75% circumference) esophagitis was found       at the gastroesophageal junction and distal esophagus.      Four tongues of salmon-colored mucosa were present from 40 to 41 cm and       circumferential salmon-colored mucosa was present from 41 to 43 cm. No       other visible abnormalities were present. The maximum longitudinal       extent of these esophageal mucosal changes was 3 cm in length.      A 2 cm hiatal hernia was found. The proximal extent of the gastric folds       (end of tubular esophagus) was 43 cm from the incisors. The hiatal       narrowing was 45 cm from  the incisors.      Hematin (altered blood/coffee-ground-like material) was found in the       gastric body. Lavage of the area was performed using copious amounts,       resulting in clearance with fair visualization.      Multiple dispersed, small non-bleeding erosions were found in the       gastric body, at the incisura and in the gastric antrum. There were no       stigmata of recent bleeding.      No other gross lesions were noted in the entire examined stomach.       Biopsies were taken with a cold forceps for histology and Helicobacter        pylori testing.      One non-bleeding superficial gastric ulcer with a clean ulcer base       (Forrest Class III) was found at the pylorus. The lesion was 7 mm in       largest dimension.      Five to six oozing duodenal ulcers were found were found in the duodenal       bulb, in the D1/D2 angle and in the second portion of the duodenum. The       largest lesion was 8 mm in largest dimension. This was oozing hemorrhage       as a result of a visible vessel classified as Forrest Class Ib. The area       was successfully injected with 4 mL of a 1:10,000 solution of       epinephrine for hemostasis. Then fulguration to ablate the lesion by       bipolar probe was successful. To further prevent bleeding       post-intervention, one hemostatic clip was successfully placed (MR       conditional). There was no bleeding at the end of the procedure. Impression:               - No gross lesions in esophagus.                           - LA Grade C distal esophagitis. Underlying,                            salmon-colored mucosa suggestive of short-segment                            Barrett's esophagus - not biopsies today as needs                            to be on acid suppression medication optimally                            first..                           - 2 cm hiatal hernia.                           - Hematin (altered blood/coffee-ground-like                            material) in the  gastric body. Lavaged.                           - Non-bleeding erosive gastropathy.                           - No other gross lesions in the stomach. Biopsied                            for HP.                           - Non-bleeding gastric ulcer with a clean ulcer                            base (Forrest Class III).                           - Multiple duodenal ulcers with one that had a                            visible vessel that was oozing hemorrhage (Forrest                            Class Ib).  Injected. Treated with bipolar cautery.                            Clip (MR conditional) was placed. Recommendation:           - The patient will be observed post-procedure,                            until all discharge criteria are met.                           - Return patient to hospital ward for ongoing care.                           - Clear liquid diet.                           - Trend Hgb/Hct BID into AM tomorrow.                           - If does well overnight (I anticipate that his                            hemogram may decrease still as a result of his                            active hemorrhage into this afternoon), we will                            decide on advancing his diet.                           -  IV PPI x 72 hours total.                           - Observe patient's clinical course.                           - Repeat EGD in 2-3 months to allow time for                            healing and to re-evaluate the esophagus and plan                            biopsies to rule out Barrett's if inflammation                            remains.                           - If the patient manifests symptomatic anemia with                            overt melena and hemodynamic changes, please let                            the oncall GI service know in case urgent repeat                            EGD needs to be considered.                           - The findings and recommendations were discussed                            with the patient.                           - The findings and recommendations were discussed                            with the patient's family.                           - The findings and recommendations were discussed                            with the referring physician. Procedure Code(s):        --- Professional ---                           [phone], Esophagogastroduodenoscopy, flexible,                            transoral; with biopsy,  single or multiple Diagnosis Code(s):        --- Professional ---  K22.8, Other specified diseases of esophagus                           K20.9, Esophagitis, unspecified                           K44.9, Diaphragmatic hernia without obstruction or                            gangrene                           K92.2, Gastrointestinal hemorrhage, unspecified                           K31.89, Other diseases of stomach and duodenum                           K25.9, Gastric ulcer, unspecified as acute or                            chronic, without hemorrhage or perforation                           K26.4, Chronic or unspecified duodenal ulcer with                            hemorrhage                           D62, Acute posthemorrhagic anemia                           K92.1, Melena (includes Hematochezia) CPT copyright 2019 American Medical Association. All rights reserved. The codes documented in this report are preliminary and upon coder review may  be revised to meet current compliance requirements. Justice Britain, MD 03/17/2019 2:56:39 PM Number of Addenda: 0

## 2019-03-17 NOTE — TOC Initial Note (Signed)
Transition of Care [facility]) - Initial/Assessment Note    Patient Details  Name: Aaron Mclean MRN: [id number] Date of Birth: 03-Sep-1951  Transition of [facility]) CM/SW Contact:    Sable Feil, LCSW Phone Number: 03/17/2019, 4:27 PM  Clinical Narrative:  CSW talked with patient at the bedside. Aaron Mclean was in bed and was alert and able to converse with CSW regarding his discahge plan. Patient confirmed that he came to hospital from [facility] and plans to return there at discharge, to "ride out" the epidemic. When asked, patient responded that he lives at home alone, and added that that he has 3 children and also grandchildren that all live in [place].                 Expected Discharge Plan: Skilled Nursing Facility Barriers to Discharge: Continued Medical Work up   Patient Goals and CMS Choice Patient states their goals for this hospitalization and ongoing recovery are:: Get better  CMS Medicare.gov Compare Post Acute Care list provided to:: Other (Comment Required)(Patient from [place] and plans to return at discharge) Choice offered to / list presented to : NA  Expected Discharge Plan and Services Expected Discharge Plan: [place] In-house Referral: Clinical Social Work Discharge Planning Services: Other - See comment(SNF for continued rehab) Post Acute Care Choice: [place] arrangements for the past 2 months: Skilled Nursing Facility(Admitted to [facility] on 02/27/19. Prior to SNF, patient was in the hospital -coming from home)                 DME Arranged: N/A DME Agency: NA HH Arranged: NA [place] Agency: NA  Prior Living Arrangements/Services Living arrangements for the past 2 months: Skilled Nursing Facility(Admitted to [facility] on 02/27/19. Prior to SNF, patient was in the hospital -coming from home) Lives with:: Self Patient language and need for interpreter reviewed:: No Do you feel safe going back to the place where  you live?: No   Patient agreeable to returning to [place]. Patient indicated that he wants to "ride out" the epidemic  Need for Family Participation in Patient Care: Yes (Comment) Care giver support system in place?: Yes (comment)(Per patient his children live in [place]) Current home services: Other (comment)(Come from SNF to hospital) Criminal Activity/Legal Involvement Pertinent to Current Situation/Hospitalization: No - Comment as needed  Activities of Daily Living Home Assistive Devices/Equipment: Environmental consultant (specify type), Cane (specify quad or straight) ADL Screening (condition at time of admission) Patient's cognitive ability adequate to safely complete daily activities?: Yes Is the patient deaf or have difficulty hearing?: No Does the patient have difficulty seeing, even when wearing glasses/contacts?: No Does the patient have difficulty concentrating, remembering, or making decisions?: No Patient able to express need for assistance with ADLs?: Yes Does the patient have difficulty dressing or bathing?: Yes Independently performs ADLs?: No Communication: Appropriate for developmental age Dressing (OT): Dependent Is this a change from baseline?: Pre-admission baseline Grooming: Dependent Is this a change from baseline?: Pre-admission baseline Feeding: Appropriate for developmental age Bathing: Dependent Is this a change from baseline?: Pre-admission baseline Toileting: Dependent Is this a change from baseline?: Pre-admission baseline In/Out Bed: Dependent Is this a change from baseline?: Pre-admission baseline Walks in Home: Dependent Does the patient have difficulty walking or climbing stairs?: Yes Weakness of Legs: Both Weakness of Arms/Hands: None  Permission Sought/Granted Permission sought to share information with : Family Supports Permission granted to share information with : Yes, Verbal Permission Granted  Share  Information with NAME: Aaron Mclean and Aaron Mclean     Permission granted to share info w Relationship: Son and Daughter  Permission granted to share info w Contact Information: [name] [phone] and Olivia Mackie [phone]  Emotional Assessment Appearance:: Appears stated age Attitude/Demeanor/Rapport: Engaged Affect (typically observed): Pleasant, Appropriate Orientation: : Oriented to Self, Oriented to Place, Oriented to  Time Alcohol / Substance Use: Tobacco Use, Alcohol Use, Illicit Drugs(Per H&P patient reported that he quit smoking and does not drink or use illicit drugs) Psych Involvement: No (comment)  Admission diagnosis:  GI BLEED Patient Active Problem List   Diagnosis Date Noted  . Pressure injury of skin 03/17/2019  . Diverticulosis of intestine with bleeding 03/16/2019  . Chronic systolic CHF (congestive heart failure), NYHA class 4 ([place]) 12/02/2018  . OSA on CPAP 12/02/2018  . DM (diabetes mellitus), type 2 with peripheral vascular complications ([place]) 35/70/1779  . CKD (chronic kidney disease) stage 3, GFR 30-59 ml/min (HCC) 12/02/2018  . Essential hypertension 12/02/2018  . Dyslipidemia 12/02/2018  . Benign prostatic hyperplasia with urinary obstruction 09/20/2015  . Family history of malignant neoplasm of prostate 09/20/2015  . Excessive urination at night 09/20/2015   PCP:  Marco Collie, MD Pharmacy:   [place], [place] - [phone] [place] [address] Phone: [phone] Fax: [phone]   Social Determinants of Health (SDOH) Interventions  None provided during this visit with patient.  Readmission Risk Interventions No flowsheet data found.

## 2019-03-17 NOTE — Progress Notes (Signed)
Pt had 5beat run of Vtach, pt in bed, alert and asymptomatic, VSS on RA; MD made aware. Will continue to monitor pt.

## 2019-03-17 NOTE — Transfer of Care (Signed)
Immediate Anesthesia Transfer of Care Note  Patient: Aaron Mclean  Procedure(s) Performed: ESOPHAGOGASTRODUODENOSCOPY (EGD) WITH PROPOFOL (N/A ) SCLEROTHERAPY HOT HEMOSTASIS (ARGON PLASMA COAGULATION/BICAP) (N/A ) BIOPSY HEMOSTASIS CLIP PLACEMENT  Patient Location: PACU  Anesthesia Type:General  Level of Consciousness: awake and drowsy  Airway & Oxygen Therapy: Patient Spontanous Breathing and Patient connected to nasal cannula oxygen  Post-op Assessment: Report given to RN and Post -op Vital signs reviewed and stable  Post vital signs: Reviewed and stable  Last Vitals:  Vitals Value Taken Time  BP    Temp    Pulse    Resp    SpO2      Last Pain:  Vitals:   03/17/19 1305  TempSrc: Oral  PainSc: 0-No pain         Complications: No apparent anesthesia complications

## 2019-03-17 NOTE — Care Plan (Signed)
Mr. Scarano has been identified as high risk for 30 day mortality with an EF of 10-15%, frailty, functional status decline and CKD in the setting of acute GI bleed. He is currently FULL CODE with no documented HCPOA. Please consider inpatient palliative care consult if patient does not have improvement, otherwise please place order for [facility] Based Palliative Care referral for advance care planning upon discharge and include this recommendation on the dc summary.  Lane Hacker, DO Palliative Medicine [phone]

## 2019-03-17 NOTE — Consult Note (Signed)
   [facility] CM Inpatient Consult   03/17/2019  THONG FEENY 1951/01/30 [id number]    Patient screened for unplanned readmission and hospitalizations at 14% (low).  Patient was previously seen by [facility] care management but not currently active.  With [employer] [HTA] plan, the patient will receive a post hospital transition of care from HTA UM team and will be evaluated for assessments and disease process education by their team.   For questions and additional information, please contact:  Edwena Felty A. Eddy Termine, BSN, RN-BC [facility] Liaison Cell: [phone]

## 2019-03-17 NOTE — Progress Notes (Signed)
PROGRESS NOTE  Aaron Mclean ZOX:[id number] DOB: 1951-04-05 DOA: 03/16/2019 PCP: Marco Collie, MD  HPI/Recap of past 24 hours: Aaron Mclean is a 68 y.o. male with medical history significant of diabetes type 2 uncontrolled with peripheral vascular disease status post toe amputations, atrial fibrillation, dementia, chronic kidney disease stage III, struct of sleep apnea on CPAP, hypertension, dyslipidemia and congestive heart failure with an ejection fraction of 10 to 15% thought worsened by PVCs.  Patient was last discharged from our facility in December 2019 when he was placed on amiodarone in hopes that they could suppress his PVCs and improve his ejection fraction.  Follow-up was planned but patient was lost to follow-up when he entered [facility] nursing facility in [place].    Patient was sent from the nursing facility to [facility] on morning of 4/5 area early this morning patient complained of dark stools at the facility Was not anticoagulated.    Previous colonoscopy done over a decade ago demonstrated diverticulosis and internal hemorrhoids.  Hemoglobin at 12.7, was 15.4 at facility.  Transferred to [facility] due to lack of GI coverage at [facility].  Over the next 24 hours, hemoglobin has continued to trend downward and currently at 8.5 with increase in patient's creatinine.  Patient himself is quite somnolent.  GI consulted plan to take patient for endoscopy for concerns for upper GI bleed.  Assessment/Plan: Principal Problem: Acute blood loss anemia with concerns for upper versus lower GI bleed: Seen by GI, for EGD.  Continue IV PPI.  Previous history of diverticulosis.  Dementia without behavioral disturbance: Stable Active Problems:   Benign prostatic hyperplasia with urinary obstruction   Chronic systolic CHF (congestive heart failure), NYHA class 4 ([place]): Monitoring strict input and output.  EF very low at 10 to 15%.   OSA on CPAP   DM (diabetes mellitus), type 2 with  peripheral vascular complications (HCC) on n.p.o. on sliding scale   CKD (chronic kidney disease) stage 3, GFR 30-59 ml/min ([place]): Slightly worsening creatinine, possibly from GI bleed.  Still at stage III   Essential hypertension   Dyslipidemia   Pressure injury of skin wound breakdown noted on bridge of patient's nose.  Noted by nursing, present on admission.  Gel barrier placed   Code Status: Full code  Family Communication: Left message for son  Disposition Plan: Potential discharge in next few days if bleeding source can be found and stabilized   Consultants:  [facility]  Procedures:  Planned endoscopy 4/6  Antimicrobials:  None  DVT prophylaxis: SCDs   Objective: Vitals:   03/17/19 0900 03/17/19 1305  BP: 110/62 (!) 100/46  Pulse: 81 99  Resp: 18 (!) 22  Temp: 97.8 F (36.6 C)   SpO2: 100% 99%    Intake/Output Summary (Last 24 hours) at 03/17/2019 1341 Last data filed at 03/17/2019 0447 Gross per 24 hour  Intake 783 ml  Output 925 ml  Net -142 ml   Filed Weights   03/17/19 1305  Weight: 81.2 kg   Body mass index is 26.43 kg/m.  Exam:   General: Somnolent, no acute distress  HEENT: Normocephalic and atraumatic, mucous membranes are dry  Neck: Supple, no JVD  Cardiovascular: Regular rate and rhythm, S1-S2, 2 out of 6 systolic ejection murmur  Respiratory: Clear to auscultation bilaterally  Abdomen: Soft, nondistended, hypoactive bowel sounds  Musculoskeletal: No clubbing or cyanosis, trace pitting edema  Skin: Some noted mild skin breakdown on bridge of nose, otherwise no skin breaks,  tears or lesions  Psychiatry: Chronic underlying dementia    Data Reviewed: CBC: Recent Labs  Lab 03/16/19 1328 03/16/19 1903 03/17/19 0050 03/17/19 0805  WBC  --   --   --  7.3  HGB 12.2* 9.6* 9.0* 8.5*  HCT 37.0* 29.2* 27.0* 25.3*  MCV  --   --   --  81.1  PLT  --   --   --  595*   Basic Metabolic Panel: Recent Labs  Lab 03/17/19 0805  NA  134*  K 3.2*  CL 99  CO2 24  GLUCOSE 127*  BUN 76*  CREATININE 2.29*  CALCIUM 7.9*  MG 1.5*   GFR: Estimated Creatinine Clearance: 30.4 mL/min (A) (by C-G formula based on SCr of 2.29 mg/dL (H)). Liver Function Tests: No results for input(s): AST, ALT, ALKPHOS, BILITOT, PROT, ALBUMIN in the last 168 hours. No results for input(s): LIPASE, AMYLASE in the last 168 hours. No results for input(s): AMMONIA in the last 168 hours. Coagulation Profile: No results for input(s): INR, PROTIME in the last 168 hours. Cardiac Enzymes: No results for input(s): CKTOTAL, CKMB, CKMBINDEX, TROPONINI in the last 168 hours. BNP (last 3 results) No results for input(s): PROBNP in the last 8760 hours. HbA1C: Recent Labs    03/16/19 1328  HGBA1C 6.8*   CBG: Recent Labs  Lab 03/16/19 1319 03/16/19 1628 03/16/19 2120 03/17/19 0659 03/17/19 1124  GLUCAP 84 124* 152* 118* 145*   Lipid Profile: No results for input(s): CHOL, HDL, LDLCALC, TRIG, CHOLHDL, LDLDIRECT in the last 72 hours. Thyroid Function Tests: No results for input(s): TSH, T4TOTAL, FREET4, T3FREE, THYROIDAB in the last 72 hours. Anemia Panel: No results for input(s): VITAMINB12, FOLATE, FERRITIN, TIBC, IRON, RETICCTPCT in the last 72 hours. Urine analysis: No results found for: COLORURINE, APPEARANCEUR, LABSPEC, PHURINE, GLUCOSEU, HGBUR, BILIRUBINUR, KETONESUR, PROTEINUR, UROBILINOGEN, NITRITE, LEUKOCYTESUR Sepsis Labs: @LABRCNTIP (procalcitonin:4,lacticidven:4)  ) Recent Results (from the past 240 hour(s))  MRSA PCR Screening     Status: None   Collection Time: 03/16/19  3:46 PM  Result Value Ref Range Status   MRSA by PCR NEGATIVE NEGATIVE Final    Comment:        The GeneXpert MRSA Assay (FDA approved for NASAL specimens only), is one component of a comprehensive MRSA colonization surveillance program. It is not intended to diagnose MRSA infection nor to guide or monitor treatment for MRSA infections. Performed at  [facility], [place] [address] [place], [place] [address]       Studies: No results found.  Scheduled Meds: . [MAR Hold] amiodarone  200 mg Oral Daily  . [MAR Hold] atorvastatin  80 mg Oral q1800  . [MAR Hold] carvedilol  3.125 mg Oral BID WC  . [MAR Hold] furosemide  40 mg Oral BID  . [MAR Hold] insulin aspart  0-15 Units Subcutaneous TID WC  . [MAR Hold] insulin glargine  12 Units Subcutaneous Daily  . [MAR Hold] pantoprazole (PROTONIX) IV  40 mg Intravenous Q12H  . [MAR Hold] sodium chloride flush  3 mL Intravenous Q12H  . [MAR Hold] sodium chloride flush  3 mL Intravenous Q12H    Continuous Infusions: . [MAR Hold] sodium chloride    . sodium chloride 20 mL/hr at 03/17/19 1315     LOS: 1 day     Annita Brod, MD [facility]  To reach me or the doctor on call, go to: [email or web address] Password [email or web address]  03/17/2019, 1:41 PM

## 2019-03-17 NOTE — Consult Note (Signed)
[facility] Consult: 10:37 AM 03/17/2019  LOS: 1 day    Referring Provider: Dr Gevena Barre.    Primary Care Physician:  Marco Collie, MD in [facility] Primary Gastroenterologist:  unassigned   Osamah Schmader dtr [phone]   Reason for Consultation: Anemia.  Reported melena versus bloody stool at SNF.   HPI: Aaron Mclean is a 68 y.o. male.  Multiple significant comorbidities, chronic illnesses listed below.  A. Fib, not on Coumadin.  His EF is 10 to 15% problem compounded by PVCs.  No GI issues reported in past medical history.  Previous colonoscopy ~ 2010 reported as revealing internal hemorrhoids and diverticulosis. He discharged from [facility] to SNF for rehab ~ 2 weeks ago, previously lived independently.  Diagnosed with UTI.  Dementia flared during admission but now resolved confusion.  His dtr is fixing up a room at her home in [place] and plan is to move him there when appropriate.    Admitted yesterday had 2 or 3 days of passing maroon stool, not large quantities.  No nausea, vomiting, abdominal pain, change in appetite.  No dysphagia.  No previous issues with anemia or unexplained bleeding.  Hgb 12.7 (12.8 .  MCV 84.  PT/INR 15/1.5.  BUN/creatinine 72/2.  Hypotensive at 90s/50s.  Heart rate in the 90s IV Protonix initiated At [facility] ED patient passed a bowel movement after standing blood was noted on the toilet seat.   Patient meds include 81 ASA.  No PPI or H2 blocker.  Daily PO protonix initiated.    Past Medical History:  Diagnosis Date  . A-fib ([place])   . Arthritis   . CKD (chronic kidney disease)   . Constipation   . Diabetes mellitus without complication ([place])   . Dysrhythmia    a-fib  - 3 episodes first of 2018. None since starting Bystolic, (0/9/98 [name] OV notes  patient reported home episodes where heart rate felt irregular. Had PACs, PVCs on EKG.)  . Hemorrhoid    internal  . Hypertension   . Neuropathy   . Sleep apnea    can't use cpap    Past Surgical History:  Procedure Laterality Date  . CARDIOVASCULAR STRESS TEST     05/01/17 [facility]): No ischemia. Fixed defect in the inferior segment most likely from scar from old MI. Marked hypokinetic inferior segment of LV with EF 37%.   . EXOSTECTECTOMY TOE Right 07/02/2017   Procedure: TORSAL EXOSTECTOMY FIFTH METARSAL BASE WOUND DEBRIDMENT AND GRAFT APPLICATION;  Surgeon: [name], DPM;  Location: [place];  Service: Podiatry;  Laterality: Right;  . FOOT TENDON SURGERY Right    spur removed  . GRAFT APPLICATION Right 3/38/2505   Procedure: GRAFT APPLICATION;  Surgeon: [name], DPM;  Location: [place];  Service: Podiatry;  Laterality: Right;  . RIGHT/LEFT HEART CATH AND CORONARY ANGIOGRAPHY N/A 12/02/2018   Procedure: RIGHT/LEFT HEART CATH AND CORONARY ANGIOGRAPHY;  Surgeon: Jolaine Artist, MD;  Location: [employer] CV [facility];  Service: Cardiovascular;  Laterality: N/A;  . ROTATOR CUFF REPAIR  Right   . WOUND DEBRIDEMENT Right 07/02/2017   Procedure: DEBRIDEMENT WOUND;  Surgeon: [name], DPM;  Location: [place];  Service: Podiatry;  Laterality: Right;    Prior to Admission medications   Medication Sig Start Date End Date Taking? Authorizing Provider  acetaminophen (TYLENOL) 325 MG tablet Take 650 mg by mouth every 6 (six) hours as needed (for pain or a fever >100.4 degrees F).    Yes [provider]  amiodarone (PACERONE) 200 MG tablet Take 200 mg by mouth daily.   Yes [provider]  aspirin EC 81 MG tablet Take 1 tablet (81 mg total) by mouth daily. 12/03/18  Yes Hosie Poisson, MD  atorvastatin (LIPITOR) 80 MG tablet Take 1 tablet (80 mg total) by mouth daily. 12/04/18  Yes Hosie Poisson, MD  carvedilol (COREG) 3.125 MG tablet Take 1 tablet (3.125 mg  total) by mouth 2 (two) times daily with a meal. 12/03/18  Yes Hosie Poisson, MD  furosemide (LASIX) 40 MG tablet Take 1 tablet (40 mg total) by mouth daily as needed for fluid or edema. Patient taking differently: Take 40 mg by mouth 2 (two) times daily.  12/03/18 03/16/19 Yes Hosie Poisson, MD  insulin glargine (LANTUS) 100 UNIT/ML injection Inject 12 Units into the skin daily.   Yes [provider]  ondansetron (ZOFRAN) 4 MG tablet Take 4 mg by mouth every 6 (six) hours as needed for nausea or vomiting.   Yes [provider]  amiodarone (PACERONE) 400 MG tablet Take 1 tablet (400 mg total) by mouth 2 (two) times daily. Patient not taking: Reported on 03/16/2019 12/03/18   Hosie Poisson, MD  docusate sodium (COLACE) 100 MG capsule Take 1 capsule (100 mg total) by mouth 2 (two) times daily. Patient not taking: Reported on 03/16/2019 12/03/18   Hosie Poisson, MD  Insulin Degludec-Liraglutide (XULTOPHY Rozel) Inject into the skin. Samples, pt unable to provide dose.    [provider]  liraglutide (VICTOZA) 18 MG/3ML SOPN Inject 1.2 mg into the skin daily.    [provider]   Scheduled Meds: . amiodarone  200 mg Oral Daily  . atorvastatin  80 mg Oral q1800  . carvedilol  3.125 mg Oral BID WC  . furosemide  40 mg Oral BID  . insulin aspart  0-15 Units Subcutaneous TID WC  . insulin glargine  12 Units Subcutaneous Daily  . sodium chloride flush  3 mL Intravenous Q12H  . sodium chloride flush  3 mL Intravenous Q12H   Infusions: . sodium chloride    . pantoprazole (PROTONIX) IV 80 mg (03/17/19 0241)   PRN Meds: sodium chloride, acetaminophen **OR** acetaminophen, ondansetron **OR** ondansetron (ZOFRAN) IV, sodium chloride flush, traMADol   Allergies as of 03/16/2019 - Review Complete 03/16/2019  Allergen Reaction Noted  . Sertraline Rash 12/03/2018    Family History  Problem Relation Age of Onset  . Alzheimer's disease Mother   . Congestive Heart Failure  Mother   . Cancer Father     Social History   Socioeconomic History  . Marital status: Single    Spouse name: Not on file  . Number of children: Not on file  . Years of education: Not on file  . Highest education level: Not on file  Occupational History  . Not on file  Social Needs  . Financial resource strain: Not on file  . Food insecurity:    Worry: Not on file    Inability: Not on file  . Transportation needs:  Medical: Not on file    Non-medical: Not on file  Tobacco Use  . Smoking status: Former Smoker    Types: Cigars  . Smokeless tobacco: Never Used  Substance and Sexual Activity  . Alcohol use: No    Alcohol/week: 0.0 standard drinks  . Drug use: No  . Sexual activity: Not on file  Lifestyle  . Physical activity:    Days per week: Not on file    Minutes per session: Not on file  . Stress: Not on file  Relationships  . Social connections:    Talks on phone: Not on file    Gets together: Not on file    Attends religious service: Not on file    Active member of club or organization: Not on file    Attends meetings of clubs or organizations: Not on file    Relationship status: Not on file  . Intimate partner violence:    Fear of current or ex partner: Not on file    Emotionally abused: Not on file    Physically abused: Not on file    Forced sexual activity: Not on file  Other Topics Concern  . Not on file  Social History Narrative  . Not on file    REVIEW OF SYSTEMS: Constitutional: Feels tired and weak. ENT:  No nose bleeds Pulm: No shortness of breath or cough. CV:  No palpitations, no LE edema.  Chest pain. GU:  No hematuria, no frequency GI: Per HPI. Heme: No unusual bleeding or bruising. Transfusions: None per epic record. Neuro:  No headaches, no peripheral tingling or numbness Derm:  No itching, no rash or sores.  Endocrine:  No sweats or chills.  No polyuria or dysuria Immunization: Reviewed vaccination record, there is nothing listed.  Travel:  None beyond local counties in last few months.    PHYSICAL EXAM: Vital signs in last 24 hours: Vitals:   03/17/19 0447 03/17/19 0900  BP: 105/65 110/62  Pulse: 73 81  Resp: 16 18  Temp: 97.9 F (36.6 C) 97.8 F (36.6 C)  SpO2: 100% 100%   Wt Readings from Last 3 Encounters:  12/03/18 81.7 kg  06/26/17 93.4 kg  08/17/16 100.2 kg    General: Pleasant, cooperative, alert.  Elderly gentleman who looks well. Head: No facial asymmetry or swelling.  No signs of head trauma. Eyes: No scleral icterus.  No conjunctival pallor. Ears: Not hard of hearing Nose: No congestion or discharge Mouth: Oropharynx moist, pink, clear.  Tongue midline tiny prominent papilla at the distal portion.  Fair dentition. Neck: No JVD, no masses.  Nodularity, adenopathy bilaterally noted.  Asymmetry at the right sternoclavicular joint which is enlarged but firm.  No masses palpated. Lungs: Clear bilaterally. Heart: Irregularly irregular with multiple paired PVCs. Abdomen: Soft.  Not tender or distended.  Active bowel sounds.  No HSM, masses, bruits, hernias..   Rectal: Glistening, pastelike maroon stool, smells melenic.  Large pressure dressing covers the coccyx and lumbar spine.  This was not lifted for exam. Musc/Skeltl: No joint deformities or redness other than the right sternoclavicular joint irregularity described above. Extremities: No CCE.  Feet are warm and dry. Neurologic: Alert.  That will he tells me his name.  He believes he is at collapse nursing [facility].  He cannot tell me the year. Skin: No telangiectasia, no rashes. Tattoos: None observed. Nodes: Cervical adenopathy. Psych: Pleasant, cooperative, not agitated  Intake/Output from previous day: 04/05 0701 - 04/06 0700 In: 1003 [P.O.:997; I.V.:6] Out:  1075 [Urine:1075] Intake/Output this shift: No intake/output data recorded.  LAB RESULTS: Recent Labs    03/16/19 1903 03/17/19 0050 03/17/19 0805  WBC  --   --  7.3  HGB 9.6*  9.0* 8.5*  HCT 29.2* 27.0* 25.3*  PLT  --   --  123*   BMET Lab Results  Component Value Date   NA 134 (L) 03/17/2019   NA 137 12/03/2018   NA 134 (L) 07/02/2017   K 3.2 (L) 03/17/2019   K 3.5 12/03/2018   K 4.0 07/02/2017   CL 99 03/17/2019   CL 103 12/03/2018   CL 102 07/02/2017   CO2 24 03/17/2019   CO2 23 12/03/2018   CO2 23 07/02/2017   GLUCOSE 127 (H) 03/17/2019   GLUCOSE 147 (H) 12/03/2018   GLUCOSE 133 (H) 07/02/2017   BUN 76 (H) 03/17/2019   BUN 30 (H) 12/03/2018   BUN 25 (H) 07/02/2017   CREATININE 2.29 (H) 03/17/2019   CREATININE 1.71 (H) 12/03/2018   CREATININE 1.79 (H) 12/02/2018   CALCIUM 7.9 (L) 03/17/2019   CALCIUM 8.7 (L) 12/03/2018   CALCIUM 9.5 07/02/2017   LFT No results for input(s): PROT, ALBUMIN, AST, ALT, ALKPHOS, BILITOT, BILIDIR, IBILI in the last 72 hours. PT/INR Lab Results  Component Value Date   INR 1.23 12/03/2018     RADIOLOGY STUDIES: No results found.    IMPRESSION:   *    Bleed.  Frank melena on DRE today.  BUN elevated.  Both of these strongly suggest upper GI source of bleeding.  Rule out ulcer disease, rule out AVMs.  *    CHF.  Yesterday's echo shows EF 15%, left pleural effusion.  Mitral valve calcification, moderate to severe MR, moderate TR..  Currently very well compensated with no dyspnea. Cardiac cath 11/2018 with CAD.  Multivessel disease anywhere from 30 to 100%.  Per DC summary, cards planned outpatient PCI of RCA, however pt has not returned to cardiology for outpatient visit.   PLAN:     *   Attempting to arrange upper endoscopy for this afternoon.  Patient had clear liquids at breakfast but I will make him n.p.o. and he should be able to undergo scope this afternoon.  *    Continue IV Protonix, I dropped the dose from 80 mg twice daily to 40 mg twice daily.  Depending on findings at the EGD, we may need to replace this with Protonix drip.  *   The patient is not fully oriented I reached out to his daughter  Olivia Mackie who is agreeable to upper endoscopy.  Phone number is listed at the top of this note.     Azucena Freed  03/17/2019, 10:37 AM Phone [phone]

## 2019-03-17 NOTE — Anesthesia Preprocedure Evaluation (Signed)
Anesthesia Evaluation  Patient identified by MRN, date of birth, ID band Patient awake    Reviewed: Allergy & Precautions, H&P , NPO status , Patient's Chart, lab work & pertinent test results  Airway Mallampati: II  TM Distance: >3 FB Neck ROM: Full    Dental no notable dental hx. (+) Teeth Intact, Dental Advisory Given   Pulmonary sleep apnea , former smoker,    Pulmonary exam normal        Cardiovascular hypertension, + Peripheral Vascular Disease and +CHF  + dysrhythmias Atrial Fibrillation  Rhythm:Regular Rate:Normal     Neuro/Psych negative neurological ROS  negative psych ROS   GI/Hepatic negative GI ROS, Neg liver ROS,   Endo/Other  diabetes, Insulin Dependent  Renal/GU Renal InsufficiencyRenal disease  negative genitourinary   Musculoskeletal  (+) Arthritis , Osteoarthritis,    Abdominal   Peds  Hematology negative hematology ROS (+)   Anesthesia Other Findings   Reproductive/Obstetrics negative OB ROS                             Anesthesia Physical Anesthesia Plan  ASA: IV  Anesthesia Plan: General   Post-op Pain Management:    Induction: Intravenous  PONV Risk Score and Plan: 2 and Ondansetron and Treatment may vary due to age or medical condition  Airway Management Planned: Oral ETT  Additional Equipment:   Intra-op Plan:   Post-operative Plan: Extubation in OR  Informed Consent: I have reviewed the patients History and Physical, chart, labs and discussed the procedure including the risks, benefits and alternatives for the proposed anesthesia with the patient or authorized representative who has indicated his/her understanding and acceptance.     Dental advisory given  Plan Discussed with: CRNA  Anesthesia Plan Comments:         Anesthesia Quick Evaluation

## 2019-03-17 NOTE — Anesthesia Procedure Notes (Signed)
Procedure Name: Intubation Date/Time: 03/17/2019 1:54 PM Performed by: Inda Coke, CRNA Pre-anesthesia Checklist: Patient identified, Emergency Drugs available, Suction available and Patient being monitored Patient Re-evaluated:Patient Re-evaluated prior to induction Oxygen Delivery Method: Circle System Utilized Preoxygenation: Pre-oxygenation with 100% oxygen Induction Type: IV induction, Rapid sequence and Cricoid Pressure applied Laryngoscope Size: Glidescope and 4 Grade View: Grade I Tube type: Oral Tube size: 7.5 mm Number of attempts: 1 Airway Equipment and Method: Stylet and Oral airway Placement Confirmation: ETT inserted through vocal cords under direct vision,  positive ETCO2 and breath sounds checked- equal and bilateral Secured at: 23 cm Tube secured with: Tape Dental Injury: Teeth and Oropharynx as per pre-operative assessment

## 2019-03-17 NOTE — Plan of Care (Signed)
  Problem: Clinical Measurements: Goal: Ability to maintain clinical measurements within normal limits will improve Outcome: Progressing   Problem: Education: Goal: Knowledge of General Education information will improve Description Including pain rating scale, medication(s)/side effects and non-pharmacologic comfort measures Outcome: Progressing   Problem: Elimination: Goal: Will not experience complications related to bowel motility Outcome: Progressing

## 2019-03-18 ENCOUNTER — Other Ambulatory Visit: Payer: Self-pay

## 2019-03-18 DIAGNOSIS — Z515 Encounter for palliative care: Secondary | ICD-10-CM

## 2019-03-18 DIAGNOSIS — Z7189 Other specified counseling: Secondary | ICD-10-CM

## 2019-03-18 DIAGNOSIS — K5791 Diverticulosis of intestine, part unspecified, without perforation or abscess with bleeding: Secondary | ICD-10-CM

## 2019-03-18 DIAGNOSIS — K269 Duodenal ulcer, unspecified as acute or chronic, without hemorrhage or perforation: Secondary | ICD-10-CM

## 2019-03-18 LAB — GLUCOSE, CAPILLARY
Glucose-Capillary: 28 mg/dL — CL (ref 70–99)
Glucose-Capillary: 67 mg/dL — ABNORMAL LOW (ref 70–99)
Glucose-Capillary: 82 mg/dL (ref 70–99)
Glucose-Capillary: 149 mg/dL — ABNORMAL HIGH (ref 70–99)
Glucose-Capillary: 154 mg/dL — ABNORMAL HIGH (ref 70–99)
Glucose-Capillary: 187 mg/dL — ABNORMAL HIGH (ref 70–99)
Glucose-Capillary: 110 mg/dL — ABNORMAL HIGH (ref 70–99)
Glucose-Capillary: 107 mg/dL — ABNORMAL HIGH (ref 70–99)

## 2019-03-18 LAB — BASIC METABOLIC PANEL
Anion gap: 13 (ref 5–15)
BUN: 73 mg/dL — ABNORMAL HIGH (ref 8–23)
CO2: 23 mmol/L (ref 22–32)
Calcium: 8.1 mg/dL — ABNORMAL LOW (ref 8.9–10.3)
Chloride: 100 mmol/L (ref 98–111)
Creatinine, Ser: 2.24 mg/dL — ABNORMAL HIGH (ref 0.61–1.24)
GFR calc Af Amer: 33 mL/min — ABNORMAL LOW (ref >60)
GFR calc non Af Amer: 29 mL/min — ABNORMAL LOW (ref >60)
Glucose, Bld: 140 mg/dL — ABNORMAL HIGH (ref 70–99)
Potassium: 2.7 mmol/L — CL (ref 3.5–5.1)
Sodium: 136 mmol/L (ref 135–145)

## 2019-03-18 LAB — CBC
HCT: 24.5 % — ABNORMAL LOW (ref 39.0–52.0)
HCT: 23.1 % — ABNORMAL LOW (ref 39.0–52.0)
Hemoglobin: 8.1 g/dL — ABNORMAL LOW (ref 13.0–17.0)
Hemoglobin: 7.8 g/dL — ABNORMAL LOW (ref 13.0–17.0)
MCH: 27 pg (ref 26.0–34.0)
MCH: 27.8 pg (ref 26.0–34.0)
MCHC: 33.1 g/dL (ref 30.0–36.0)
MCHC: 33.8 g/dL (ref 30.0–36.0)
MCV: 81.7 fL (ref 80.0–100.0)
MCV: 82.2 fL (ref 80.0–100.0)
Platelets: 114 10*3/uL — ABNORMAL LOW (ref 150–400)
Platelets: 112 10*3/uL — ABNORMAL LOW (ref 150–400)
RBC: 3 MIL/uL — ABNORMAL LOW (ref 4.22–5.81)
RBC: 2.81 MIL/uL — ABNORMAL LOW (ref 4.22–5.81)
RDW: 15.6 % — ABNORMAL HIGH (ref 11.5–15.5)
RDW: 15.8 % — ABNORMAL HIGH (ref 11.5–15.5)
WBC: 6.4 10*3/uL (ref 4.0–10.5)
WBC: 7 10*3/uL (ref 4.0–10.5)
nRBC: 0 % (ref 0.0–0.2)
nRBC: 0 % (ref 0.0–0.2)

## 2019-03-18 MED ORDER — SODIUM CHLORIDE 0.9 % IV SOLN
INTRAVENOUS | Status: DC
  Administered 2019-03-18: 14:00:00 via INTRAVENOUS

## 2019-03-18 MED ORDER — DEXTROSE 10 % IV SOLN
INTRAVENOUS | Status: DC
  Administered 2019-03-18 – 2019-03-19 (×2): via INTRAVENOUS

## 2019-03-18 MED ORDER — POTASSIUM CHLORIDE CRYS ER 20 MEQ PO TBCR
40.0000 meq | EXTENDED_RELEASE_TABLET | Freq: Once | ORAL | Status: AC
  Administered 2019-03-18: 40 meq via ORAL
  Filled 2019-03-18: qty 2

## 2019-03-18 NOTE — Evaluation (Signed)
Physical Therapy Evaluation Patient Details Name: Aaron Mclean MRN: [id number] DOB: June 29, 1951 Today's Date: 03/18/2019   History of Present Illness  Pt is a 68 y/o male admitted secondary to diverticulosis of intestine and anemia. Pt is s/p EGD that revelaed duodenal and gastric ulcers. PMH includes CHF, OSA on CPAP, DM, CKD, HTN, a fib, and dementia.   Clinical Impression  Pt admitted secondary to problem above with deficits below. Required mod A to perform bed mobility tasks this session. Upon sitting, pt with very poor balance and R lateral lean and required mod to max A to maintain sitting balance. Feel pt is at increased risk for falls and will benefit from continued rehab at [facility]. Will continue to follow acutely to maximize functional mobility independence and safety.     Follow Up Recommendations SNF;Supervision/Assistance - 24 hour    Equipment Recommendations  None recommended by PT    Recommendations for Other Services       Precautions / Restrictions Precautions Precautions: Fall Restrictions Weight Bearing Restrictions: No      Mobility  Bed Mobility Overal bed mobility: Needs Assistance Bed Mobility: Supine to Sit;Sit to Supine;Rolling Rolling: Min assist   Supine to sit: Mod assist Sit to supine: Mod assist   General bed mobility comments: Mod A for assist with trunk management and LE management. Once sitting at EOB, pt with difficulty maintaining balance secondary to R lateral lean. Required mod-max A to maintain sitting balance. Further mobility deferred as unsafe to attempt with +1 assist. Noted pt's bed was soiled upon return to supine, therefore practiced rolling for linen change. REquired min A for assist with rolling.   Transfers                    Ambulation/Gait                Stairs            Wheelchair Mobility    Modified Rankin (Stroke Patients Only)       Balance Overall balance assessment: Needs  assistance Sitting-balance support: Bilateral upper extremity supported;Feet supported Sitting balance-[name]: Poor Sitting balance - Comments: Reliant on mod to max A to maintain sitting balance secondary to R lateral lean.  Postural control: Right lateral lean                                   Pertinent Vitals/Pain Pain Assessment: No/denies pain    Home Living Family/patient expects to be discharged to:: Skilled nursing facility                      Prior Function Level of Independence: Needs assistance   Gait / Transfers Assistance Needed: Pt reports he is working with PT on ambulating. Otherwise used WC at SNF and required assist for transfers to and from [facility].   ADL's / Homemaking Assistance Needed: Reports he needed assist with ADL tasks.         Hand Dominance        Extremity/Trunk Assessment   Upper Extremity Assessment Upper Extremity Assessment: Defer to OT evaluation    Lower Extremity Assessment Lower Extremity Assessment: Generalized weakness    Cervical / Trunk Assessment Cervical / Trunk Assessment: Normal  Communication   Communication: No difficulties  Cognition Arousal/Alertness: Awake/alert Behavior During Therapy: WFL for tasks assessed/performed Overall Cognitive Status: History of cognitive impairments - at baseline  General Comments: Pt oriented to person and situation. Did not know where he was or the time.       General Comments      Exercises     Assessment/Plan    PT Assessment Patient needs continued PT services  PT Problem List Decreased strength;Decreased balance;Decreased mobility;Decreased knowledge of use of DME;Decreased cognition;Decreased knowledge of precautions       PT Treatment Interventions DME instruction;Gait training;Therapeutic activities;Functional mobility training;Balance training;Therapeutic exercise;Patient/family education    PT  Goals (Current goals can be found in the Care Plan section)  Acute Rehab PT Goals Patient Stated Goal: to feel better PT Goal Formulation: With patient Time For Goal Achievement: 04/01/19 Potential to Achieve Goals: Fair    Frequency Min 2X/week   Barriers to discharge        Co-evaluation               AM-PAC PT "6 Clicks" Mobility  Outcome Measure Help needed turning from your back to your side while in a flat bed without using bedrails?: A Lot Help needed moving from lying on your back to sitting on the side of a flat bed without using bedrails?: A Lot Help needed moving to and from a bed to a chair (including a wheelchair)?: Total Help needed standing up from a chair using your arms (e.g., wheelchair or bedside chair)?: Total Help needed to walk in hospital room?: Total Help needed climbing 3-5 steps with a railing? : Total 6 Click Score: 8    End of Session Equipment Utilized During Treatment: Gait belt Activity Tolerance: Patient tolerated treatment well Patient left: in bed;with call bell/phone within reach;with bed alarm set Nurse Communication: Mobility status PT Visit Diagnosis: Unsteadiness on feet (R26.81);Muscle weakness (generalized) (M62.81);Difficulty in walking, not elsewhere classified (R26.2)    Time: 2130-8657 PT Time Calculation (min) (ACUTE ONLY): 23 min   Charges:   PT Evaluation $PT Eval Moderate Complexity: 1 Mod PT Treatments $Therapeutic Activity: 8-22 mins        Leighton Ruff, PT, DPT  Acute Rehabilitation Services  Pager: [phone] Office: [phone]   Aaron Mclean 03/18/2019, 4:54 PM

## 2019-03-18 NOTE — Progress Notes (Signed)
          Daily Rounding Note  03/18/2019, 10:02 AM  LOS: 2 days   SUBJECTIVE:   Chief complaint: bleeding duodenal ulcers, gastric ulcers.  Blood loss anemia    Still anorexic, no nausea.  No abd pain.   No BM today.    OBJECTIVE:         Vital signs in last 24 hours:    Temp:  [97 F (36.1 C)-97.7 F (36.5 C)] 97.5 F (36.4 C) (04/07 0910) Pulse Rate:  [47-99] 50 (04/07 0910) Resp:  [13-22] 20 (04/07 0910) BP: (91-100)/(46-83) 91/65 (04/07 0910) SpO2:  [95 %-100 %] 100 % (04/07 0910) Weight:  [80.3 kg-81.2 kg] 80.3 kg (04/06 2025) Last BM Date: 03/17/19 Filed Weights   03/17/19 1305 03/17/19 2025  Weight: 81.2 kg 80.3 kg   General: pale, not acutely ill looking. comfortable   Heart: Irreg, irreg, rate in 80s Chest:  Clear bil in front.  No cough or dyspnea.   Abdomen: soft, active BS.  NT, ND  Extremities: no CCE Neuro/Psych:  Moves all 4s. No tremor.  Appropriate, follows commands.    Intake/Output from previous day: 04/06 0701 - 04/07 0700 In: 2040 [P.O.:1440; I.V.:600] Out: 1750 [Urine:1750]  Intake/Output this shift: Total I/O In: 120 [P.O.:120] Out: 300 [Urine:300]  Lab Results: Recent Labs    03/17/19 0050 03/17/19 0805 03/17/19 1628 03/18/19 0623  WBC  --  7.3  --  6.4  HGB 9.0* 8.5* 7.8* 8.1*  HCT 27.0* 25.3*  --  24.5*  PLT  --  123*  --  114*   BMET Recent Labs    03/17/19 0805 03/18/19 0623  NA 134* 136  K 3.2* 2.7*  CL 99 100  CO2 24 23  GLUCOSE 127* 140*  BUN 76* 73*  CREATININE 2.29* 2.24*  CALCIUM 7.9* 8.1*     ASSESMENT:   *  Acute GIB 03/17/19 EGD: esophagitis/?short seg barretts.  HH.  CG/blood in stomach.  Clean based GU.  Multiple DUs, 1 with VV treated with epi, cautery, endoclip.   *   Blood loss anemia.  Hgb 12.2 >> 8.1.    *   Hypokalemia.  Oral Potassium ordered.    *   Thrombocytopenia.  Non-critical.      *   Azotemia.  AKI.  CKD stage 3-4.  Stable.    *    CHF.  CAD.  Outpt PCI was on plan as of 11/2018.     PLAN   *   Protonix 40 IV BID for 72 hours, then protonix 40 po bid.    *   Hgb tonight, CBC in AM.  BMET in AM.    *   Leave on clears.    *   Await biopsy for barretts and H Pylori.    *   fup EGD ~ 8 to 12 weeks, Dr Rush Landmark willl contact pt with details.      Aaron Mclean  03/18/2019, 10:02 AM Phone [phone]

## 2019-03-18 NOTE — Progress Notes (Signed)
PROGRESS NOTE  Aaron Mclean HDQ:[id number] DOB: October 28, 1951 DOA: 03/16/2019 PCP: Marco Collie, MD  HPI/Recap of past 24 hours: Aaron Mclean is a 68 y.o. male with medical history significant of diabetes type 2 uncontrolled with peripheral vascular disease status post toe amputations, atrial fibrillation, dementia, chronic kidney disease stage III, struct of sleep apnea on CPAP, hypertension, dyslipidemia and congestive heart failure with an ejection fraction of 10 to 15% thought worsened by PVCs.  Patient was last discharged from our facility in December 2019 when he was placed on amiodarone in hopes that they could suppress his PVCs and improve his ejection fraction.  Follow-up was planned but patient was lost to follow-up when he entered [facility] nursing facility in [place].      Patient was sent from the nursing facility to [facility] on morning of 4/5 area early this morning patient complained of dark stools at the facility Was not anticoagulated.    Previous colonoscopy done over a decade ago demonstrated diverticulosis and internal hemorrhoids.  Hemoglobin at 12.7, was 15.4 at facility.  Transferred to [facility] due to lack of GI coverage at [facility].  Over the next 24 hours, hemoglobin has continued to trend downward and currently at 8.5 with increase in patient's creatinine.  Patient himself is quite somnolent.  Patient taken for endoscopy noting multiple gastric and duodenal ulcers, nonbleeding.  Today, patient's hemoglobin has been stable.  Nursing reported some issues with incomplete voiding while patient is able to void, but then still has some retained urine on bladder scan.  Assessment/Plan: Principal Problem: Acute blood loss anemia with concerns for secondary to gastric/duodenal ulcerations and erosions: Status post EGD.  Bleeding stable.  Continue PPI.  Dementia without behavioral disturbance: Stable Active Problems:   Benign prostatic hyperplasia with urinary  obstruction: Noted some urinary retention, however patient is able to void still.   Chronic systolic CHF (congestive heart failure), NYHA class 4 ([place]): Monitoring strict input and output.  EF very low at 10 to 15%.  Hold Lasix and very gentle hydration given worsening renal failure   OSA on CPAP   DM (diabetes mellitus), type 2 with peripheral vascular complications (HCC) on n.p.o. on sliding scale Acute on CKD (chronic kidney disease) stage 3, GFR 30-59 ml/min ([place]): Slightly worsening creatinine, possibly from GI bleed.  We will try gentle hydration, holding Lasix   Essential hypertension   Dyslipidemia   Pressure injury of skin wound breakdown noted on bridge of patient's nose.  Noted by nursing, present on admission.  Gel barrier placed   Code Status: Full code  Family Communication: Left message for son  Disposition Plan: Return back to facility tomorrow if renal function a little bit better, not volume overloaded   Consultants:  [facility]  Procedures:  EGD done 4/6  Antimicrobials:  None  DVT prophylaxis: SCDs   Objective: Vitals:   03/18/19 0506 03/18/19 0910  BP: 94/63 91/65  Pulse: 68 (!) 50  Resp: 18 20  Temp: (!) 97 F (36.1 C) (!) 97.5 F (36.4 C)  SpO2: 95% 100%    Intake/Output Summary (Last 24 hours) at 03/18/2019 1358 Last data filed at 03/18/2019 1034 Gross per 24 hour  Intake 1560 ml  Output 2100 ml  Net -540 ml   Filed Weights   03/17/19 1305 03/17/19 2025  Weight: 81.2 kg 80.3 kg   Body mass index is 26.14 kg/m.  Exam:   General: Chronic dementia, no acute distress  HEENT: Normocephalic and  atraumatic, mucous membranes are dry  Neck: Supple, no JVD  Cardiovascular: Regular rate and rhythm, S1-S2, 2 out of 6 systolic ejection murmur  Respiratory: Clear to auscultation bilaterally  Abdomen: Soft, nondistended, hypoactive bowel sounds  Musculoskeletal: No clubbing or cyanosis, trace pitting edema  Skin: Some noted mild skin  breakdown on bridge of nose, otherwise no skin breaks, tears or lesions  Psychiatry: Chronic underlying dementia    Data Reviewed: CBC: Recent Labs  Lab 03/16/19 1328 03/16/19 1903 03/17/19 0050 03/17/19 0805 03/17/19 1628 03/18/19 0623  WBC  --   --   --  7.3  --  6.4  HGB 12.2* 9.6* 9.0* 8.5* 7.8* 8.1*  HCT 37.0* 29.2* 27.0* 25.3*  --  24.5*  MCV  --   --   --  81.1  --  81.7  PLT  --   --   --  123*  --  035*   Basic Metabolic Panel: Recent Labs  Lab 03/17/19 0805 03/18/19 0623  NA 134* 136  K 3.2* 2.7*  CL 99 100  CO2 24 23  GLUCOSE 127* 140*  BUN 76* 73*  CREATININE 2.29* 2.24*  CALCIUM 7.9* 8.1*  MG 1.5*  --    GFR: Estimated Creatinine Clearance: 31.1 mL/min (A) (by C-G formula based on SCr of 2.24 mg/dL (H)). Liver Function Tests: No results for input(s): AST, ALT, ALKPHOS, BILITOT, PROT, ALBUMIN in the last 168 hours. No results for input(s): LIPASE, AMYLASE in the last 168 hours. No results for input(s): AMMONIA in the last 168 hours. Coagulation Profile: No results for input(s): INR, PROTIME in the last 168 hours. Cardiac Enzymes: No results for input(s): CKTOTAL, CKMB, CKMBINDEX, TROPONINI in the last 168 hours. BNP (last 3 results) No results for input(s): PROBNP in the last 8760 hours. HbA1C: Recent Labs    03/16/19 1328  HGBA1C 6.8*   CBG: Recent Labs  Lab 03/18/19 0204 03/18/19 0238 03/18/19 0507 03/18/19 0648 03/18/19 1145  GLUCAP 67* 82 149* 154* 187*   Lipid Profile: No results for input(s): CHOL, HDL, LDLCALC, TRIG, CHOLHDL, LDLDIRECT in the last 72 hours. Thyroid Function Tests: No results for input(s): TSH, T4TOTAL, FREET4, T3FREE, THYROIDAB in the last 72 hours. Anemia Panel: No results for input(s): VITAMINB12, FOLATE, FERRITIN, TIBC, IRON, RETICCTPCT in the last 72 hours. Urine analysis: No results found for: COLORURINE, APPEARANCEUR, LABSPEC, PHURINE, GLUCOSEU, HGBUR, BILIRUBINUR, KETONESUR, PROTEINUR, UROBILINOGEN,  NITRITE, LEUKOCYTESUR Sepsis Labs: @LABRCNTIP (procalcitonin:4,lacticidven:4)  ) Recent Results (from the past 240 hour(s))  MRSA PCR Screening     Status: None   Collection Time: 03/16/19  3:46 PM  Result Value Ref Range Status   MRSA by PCR NEGATIVE NEGATIVE Final    Comment:        The GeneXpert MRSA Assay (FDA approved for NASAL specimens only), is one component of a comprehensive MRSA colonization surveillance program. It is not intended to diagnose MRSA infection nor to guide or monitor treatment for MRSA infections. Performed at [facility], [place] [address] [place],  [address]       Studies: No results found.  Scheduled Meds: . amiodarone  200 mg Oral Daily  . atorvastatin  80 mg Oral q1800  . carvedilol  3.125 mg Oral BID WC  . insulin aspart  0-15 Units Subcutaneous TID WC  . insulin glargine  12 Units Subcutaneous Daily  . pantoprazole (PROTONIX) IV  40 mg Intravenous Q12H  . sodium chloride flush  3 mL Intravenous Q12H  . sodium  chloride flush  3 mL Intravenous Q12H    Continuous Infusions: . sodium chloride    . sodium chloride    . dextrose 20 mL/hr at 03/18/19 1779     LOS: 2 days     Annita Brod, MD [facility]  To reach me or the doctor on call, go to: [email or web address] Password [email or web address]  03/18/2019, 1:58 PM

## 2019-03-18 NOTE — Anesthesia Postprocedure Evaluation (Signed)
Anesthesia Post Note  Patient: Aaron Mclean  Procedure(s) Performed: ESOPHAGOGASTRODUODENOSCOPY (EGD) WITH PROPOFOL (N/A ) SCLEROTHERAPY HOT HEMOSTASIS (ARGON PLASMA COAGULATION/BICAP) (N/A ) BIOPSY HEMOSTASIS CLIP PLACEMENT     Patient location during evaluation: Endoscopy Anesthesia Type: General Level of consciousness: awake and alert Pain management: pain level controlled Vital Signs Assessment: post-procedure vital signs reviewed and stable Respiratory status: spontaneous breathing, nonlabored ventilation, respiratory function stable and patient connected to nasal cannula oxygen Cardiovascular status: blood pressure returned to baseline and stable Postop Assessment: no apparent nausea or vomiting Anesthetic complications: no    Last Vitals:  Vitals:   03/18/19 1629 03/18/19 2131  BP: (!) 84/57 (!) 81/49  Pulse: (!) 58 71  Resp: 18 18  Temp: 36.5 C 36.9 C  SpO2: 100% 98%    Last Pain:  Vitals:   03/18/19 2131  TempSrc: Oral  PainSc:    Pain Goal:                   Barnet Glasgow

## 2019-03-18 NOTE — Progress Notes (Signed)
CBG-28.  289mL- apple juice given 217mL-boost given.   Rechecked CBG- 67 and then 82.  On call provider made aware. Will continue to monitor patient.

## 2019-03-18 NOTE — Consult Note (Signed)
Consultation Note Date: 03/18/2019   Patient Name: Aaron Mclean  DOB: 1951-10-22  MRN: [id number]  Age / Sex: 68 y.o., male  PCP: Aaron Collie, MD Referring Physician: Annita Brod, MD  Reason for Consultation: Establishing goals of care  HPI/Patient Profile: 68 y.o. male  with past medical history of CKD, T2DM, PVD with toe amputations, a fib, dementia, OSA on CPAP, HTN, HLD, and CHF w/ EF 10-15% admitted on 03/16/2019 with bloody stools. Recently discharged in Dec 2019 after HF exacerbation, started on amiodarone for PVCs. Patient had a subsequent hospitalization at [facility] (family unsure why - dehydration?), and following this hospitalization he was placed in rehab at [facility] in [place]. Patient initially sent to [facility] again for bloody stools and transferred to [facility] d/t need for GI consult. Throughout admission, hgb has trended. Endoscopy 4/6 revealed multiple gastric and duodenal ulcers, nonbleeding. Hgb now stabilized. PMT consulted for [place].   Clinical Assessment and Goals of Care: I have reviewed medical records including EPIC notes, labs and imaging, received report from RN, and then spoke with patient's son, [name],  to discuss diagnosis prognosis, GOC, EOL wishes, disposition and options.  Patient has diagnosis of dementia and per RN, is disoriented. Unable to discuss [name] with patient.   I introduced Palliative Medicine as specialized medical care for people living with serious illness. It focuses on providing relief from the symptoms and stress of a serious illness. The goal is to improve quality of life for both the patient and the family.   [name] tells me of patient's recent decline and multiple hospitalizations. He tells me that patient has not been the same since December with increasing weakness and increasing confusions. He tells me he fears the patient has  "given up". Patient spends most of his time in wheelchair or bed. [name]  tells me he is a high fall risk - often attempting to get up on his own but too weak to walk. [name] is unsure about his appetite.    We discussed his current illness and what it means in the larger context of his on-going co-morbidities.  We discussed that patient is stable and hgb has stabilized. We discussed that patient is expected to return to [facility] soon. We discussed the concerning "bigger picture" - [name] functional and cognitive decline, his heart failure and complications of uncontrolled diabetes. Discussed concerns of continued decline.   I attempted to elicit values and goals of care important to the patient.  [name] tells me that [name] did not speak about these things and he is unaware of Mr. Forget wishes.   We discussed that [name] is no longer able to make his healthcare decisions independently d/t his cognitive decline. We discussed role of surrogate decision maker and how he and his sister, tracy, are joint decision makers as they are next of kin and [name] does not have a documented HCPOA.   Advance directives, concepts specific to code status, artifical feeding and hydration, and rehospitalization were considered and discussed. We discussed concerns about [name] code status. [name] tells me they were asked about this when [name] was admitted to the rehab facility and at the time he elected full code, however; after this he discussed with his wife and sister and they discussed that [name] would not want CPR or mechanical ventilation. We discuss why [name] may not be a good candidate for these interventions. [name] agrees and requests that I place a DNR order for Mr.  [place] and Palliative Care services outpatient were explained and offered. [name] agrees to continued palliative care support outpatient at facility.  Questions and concerns were addressed. The family was encouraged to call with questions or concerns.   Primary Decision Maker NEXT OF KIN - son,  [name] and daughter, Gwenyth Bender OF RECOMMENDATIONS   - code status changed to DNR, signed DNR placed on chart - outpatient follow up with palliative care at facility - social work consult placed - **discharging MD please include request for outpatient palliative in discharge summary** - education completed with son about patient's medical conditions and role as surrogate decision maker  Code Status/Advance Care Planning:  DNR   Symptom Management:   No concerns per RN, patient comfortable  Prognosis:   Unable to determine - at high risk for acute decompensation  Discharge Planning: [facility] for rehab with Palliative care service follow-up      Primary Diagnoses: Present on Admission: . Diverticulosis of intestine with bleeding . Benign prostatic hyperplasia with urinary obstruction . Chronic systolic CHF (congestive heart failure), NYHA class 4 ([name]) . CKD (chronic kidney disease) stage 3, GFR 30-59 ml/min (HCC) . DM (diabetes mellitus), type 2 with peripheral vascular complications ([name]) . Essential hypertension . Dyslipidemia   I have reviewed the medical record, interviewed the patient and family, and examined the patient. The following aspects are pertinent.  Past Medical History:  Diagnosis Date  . A-fib ([name])   . Arthritis   . CKD (chronic kidney disease)   . Constipation   . Diabetes mellitus without complication ([name])   . Dysrhythmia    a-fib  - 3 episodes first of 2018. None since starting Bystolic, (01/11/29 [name] OV notes patient reported home episodes where heart rate felt irregular. Had PACs, PVCs on EKG.)  . Hemorrhoid    internal  . Hypertension   . Neuropathy   . Sleep apnea    can't use cpap   Social History   Socioeconomic History  . Marital status: Single    Spouse name: Not on file  . Number of children: Not on file  . Years of education: Not on file  . Highest education level: Not on file  Occupational History   . Not on file  Social Needs  . Financial resource strain: Not on file  . Food insecurity:    Worry: Not on file    Inability: Not on file  . Transportation needs:    Medical: Not on file    Non-medical: Not on file  Tobacco Use  . Smoking status: Former Smoker    Types: Cigars  . Smokeless tobacco: Never Used  Substance and Sexual Activity  . Alcohol use: No    Alcohol/week: 0.0 standard drinks  . Drug use: No  . Sexual activity: Not on file  Lifestyle  . Physical activity:    Days per week: Not on file    Minutes per session: Not on file  . Stress: Not on file  Relationships  . Social connections:    Talks on phone: Not on file    Gets together: Not on file    Attends religious service: Not on file    Active member of club or organization: Not on file    Attends meetings of clubs or organizations: Not on file    Relationship status: Not on file  Other Topics Concern  . Not on file  Social History Narrative  . Not on  file   Family History  Problem Relation Age of Onset  . Alzheimer's disease Mother   . Congestive Heart Failure Mother   . Cancer Father    Scheduled Meds: . amiodarone  200 mg Oral Daily  . atorvastatin  80 mg Oral q1800  . carvedilol  3.125 mg Oral BID WC  . insulin aspart  0-15 Units Subcutaneous TID WC  . insulin glargine  12 Units Subcutaneous Daily  . pantoprazole (PROTONIX) IV  40 mg Intravenous Q12H  . sodium chloride flush  3 mL Intravenous Q12H  . sodium chloride flush  3 mL Intravenous Q12H   Continuous Infusions: . sodium chloride    . sodium chloride 50 mL/hr at 03/18/19 1407  . dextrose 20 mL/hr at 03/18/19 0633   PRN Meds:.sodium chloride, acetaminophen **OR** acetaminophen, ondansetron **OR** ondansetron (ZOFRAN) IV, sodium chloride flush, traMADol Allergies  Allergen Reactions  . Sertraline Rash    Vital Signs: BP 91/65 (BP Location: Left Arm)   Pulse (!) 50   Temp (!) 97.5 F (36.4 C) (Oral)   Resp 20   Ht 5\' 9"   (1.753 m)   Wt 80.3 kg   SpO2 100%   BMI 26.14 kg/m  Pain Scale: 0-10 POSS *See Group Information*: 1-Acceptable,Awake and alert Pain Score: 0-No pain   SpO2: SpO2: 100 % O2 Device:SpO2: 100 % O2 Flow Rate: .   IO: Intake/output summary:   Intake/Output Summary (Last 24 hours) at 03/18/2019 1520 Last data filed at 03/18/2019 1300 Gross per 24 hour  Intake 1060 ml  Output 2100 ml  Net -1040 ml    LBM: Last BM Date: 03/17/19 Baseline Weight: Weight: 81.2 kg Most recent weight: Weight: 80.3 kg     Palliative Assessment/Data: PPS 40%    The above conversation was completed via telephone due to the visitor restrictions during the COVID-19 pandemic. Thorough chart review and discussion with necessary members of the care team was completed as part of assessment. All issues were discussed and addressed but no physical exam was performed.  Time Total: 85 minutes Greater than 50%  of this time was spent counseling and coordinating care related to the above assessment and plan.  Juel Burrow, DNP, AGNP-C Palliative Medicine Team [phone] Pager: [phone]

## 2019-03-18 NOTE — Progress Notes (Signed)
Notified MD about pt bladder scan: 401. MD suggested to wait another night to see if patient will need coude catheter. Nurse will continue to monitor.

## 2019-03-18 NOTE — Clinical Social Work Note (Signed)
Talked with Aaron Mclean, admissions director at [facility] regarding patient. Aaron Mclean aware that the plan is for Aaron Mclean to return to their facility at discharge to continue rehab When asked about plan post d/c from [facility], Aaron Mclean indicated that the son is supposed to be checking on ALF for patient. Aaron Mclean asked if COVID-19 test results needed and they are not, unless he is exhibiting symptoms, and per Aaron Mclean, he was not showing any symptoms when he left their facility to come to [facility].  Call made to daughter,Aaron Mclean - [phone] and message left. Call made to Son Aaron Mclean [phone] and conversation had regarding patient's discharge plan post-hospital and post-rehab. Per son, the plan is for his dad tor return to [facility]. CSW informed that contact has been made with a staff person at [facility] in [place], but he has not heard back from person. Per son the final destination is for his dad to stay at the home of his grandfather which is next to his home in [place]. Per son, there is a room that his grandfather built for his grandmother because he did not want her in a nursing home. They are currently doing work on this room to get it ready for his dad.  Per Aaron Mclean, the room has its own bathroom with a  walk-in shower and closet. His wife is at home with their children and can check on him and they will both assure that he has meals. Patient safety discussed, along with durable medical equipment, options of home health or hiring someone to be with his dad at certain times during the day.  Son also talked with CSW about his dad needing a will, etc., and this was discussed; along with the benefit of his dad adding a family member to his bank accounts when he can do so, in order for him (or one of his siblings) to be able to handle their father's affairs and this was discussed. Son advised that he will be informed when his dad is ready for discharge and encouraged son to call if he has any  questions.  CSW will continue to follow and facilitate discharge to [facility] when patient medically ready and insurance authorization received.  Aaron Mclean, MSW, LCSW Licensed Clinical Social Worker [place] [phone]

## 2019-03-18 NOTE — Progress Notes (Signed)
Bladder scan-595cc. In and out cath attempted x2, but unsuccessful. On all provider made aware.

## 2019-03-19 ENCOUNTER — Ambulatory Visit: Payer: PPO | Admitting: Sports Medicine

## 2019-03-19 LAB — GLUCOSE, CAPILLARY
Glucose-Capillary: 110 mg/dL — ABNORMAL HIGH (ref 70–99)
Glucose-Capillary: 157 mg/dL — ABNORMAL HIGH (ref 70–99)
Glucose-Capillary: 102 mg/dL — ABNORMAL HIGH (ref 70–99)
Glucose-Capillary: 72 mg/dL (ref 70–99)

## 2019-03-19 LAB — CBC
HCT: 20.2 % — ABNORMAL LOW (ref 39.0–52.0)
Hemoglobin: 6.6 g/dL — CL (ref 13.0–17.0)
MCH: 26.7 pg (ref 26.0–34.0)
MCHC: 32.7 g/dL (ref 30.0–36.0)
MCV: 81.8 fL (ref 80.0–100.0)
Platelets: 109 10*3/uL — ABNORMAL LOW (ref 150–400)
RBC: 2.47 MIL/uL — ABNORMAL LOW (ref 4.22–5.81)
RDW: 15.5 % (ref 11.5–15.5)
WBC: 5.2 10*3/uL (ref 4.0–10.5)
nRBC: 0 % (ref 0.0–0.2)

## 2019-03-19 LAB — BASIC METABOLIC PANEL
Anion gap: 11 (ref 5–15)
BUN: 63 mg/dL — ABNORMAL HIGH (ref 8–23)
CO2: 22 mmol/L (ref 22–32)
Calcium: 7.6 mg/dL — ABNORMAL LOW (ref 8.9–10.3)
Chloride: 100 mmol/L (ref 98–111)
Creatinine, Ser: 2.12 mg/dL — ABNORMAL HIGH (ref 0.61–1.24)
GFR calc Af Amer: 36 mL/min — ABNORMAL LOW (ref >60)
GFR calc non Af Amer: 31 mL/min — ABNORMAL LOW (ref >60)
Glucose, Bld: 73 mg/dL (ref 70–99)
Potassium: 3.1 mmol/L — ABNORMAL LOW (ref 3.5–5.1)
Sodium: 133 mmol/L — ABNORMAL LOW (ref 135–145)

## 2019-03-19 LAB — HEMOGLOBIN AND HEMATOCRIT, BLOOD
HCT: 27.9 % — ABNORMAL LOW (ref 39.0–52.0)
Hemoglobin: 10 g/dL — ABNORMAL LOW (ref 13.0–17.0)

## 2019-03-19 LAB — PREPARE RBC (CROSSMATCH)

## 2019-03-19 LAB — ABO/RH: ABO/RH(D): A POS

## 2019-03-19 MED ORDER — SODIUM CHLORIDE 0.9% IV SOLUTION
Freq: Once | INTRAVENOUS | Status: AC
  Administered 2019-03-19: 09:00:00 via INTRAVENOUS

## 2019-03-19 MED ORDER — SODIUM CHLORIDE 0.9 % IV SOLN
INTRAVENOUS | Status: DC
  Administered 2019-03-19: 17:00:00 via INTRAVENOUS

## 2019-03-19 MED ORDER — SODIUM CHLORIDE 0.9% IV SOLUTION: Freq: Once | INTRAVENOUS | Status: AC

## 2019-03-19 MED ORDER — POTASSIUM CHLORIDE CRYS ER 20 MEQ PO TBCR
40.0000 meq | EXTENDED_RELEASE_TABLET | Freq: Once | ORAL | Status: AC
  Administered 2019-03-19: 40 meq via ORAL
  Filled 2019-03-19: qty 2

## 2019-03-19 NOTE — Progress Notes (Signed)
Daily Rounding Note  03/19/2019, 10:30 AM  LOS: 3 days   SUBJECTIVE:   Chief complaint: bleeding duodenal ulcers, gastric ulcers.  Blood loss anemia.     No stools or melena per RN.  Pt feels well.  The patient is confused.  His diet is clear liquid but he tells me he had fruit for breakfast.  Also tells me he had a stool but that nurse as he did not. Denies abdominal pain, denies nausea.  OBJECTIVE:         Vital signs in last 24 hours:    Temp:  [97.4 F (36.3 C)-98.5 F (36.9 C)] 98.1 F (36.7 C) (04/08 1019) Pulse Rate:  [58-91] 91 (04/08 1019) Resp:  [18] 18 (04/08 1019) BP: (81-97)/(49-61) 90/54 (04/08 1019) SpO2:  [96 %-100 %] 100 % (04/08 1019) Last BM Date: 03/18/19 Filed Weights   03/17/19 1305 03/17/19 2025  Weight: 81.2 kg 80.3 kg   General: Pale, mildly ill appearing.  Comfortable, alert. Heart: Regular bigeminy on telemetry monitor Chest: Few crackles in the left base otherwise clear.  No shortness of breath.  No cough Abdomen: Soft.  Not tender, not distended.  Active bowel sounds. Extremities: No CCE. Neuro/Psych: Alert.  Follows commands.  No tremors or gross deficits.  Not fully oriented  Intake/Output from previous day: 04/07 0701 - 04/08 0700 In: 2132.9 [P.O.:460; I.V.:1672.9] Out: 1550 [Urine:1550]  Intake/Output this shift: Total I/O In: 690 [P.O.:690] Out: -   Lab Results: Recent Labs    03/18/19 0623 03/18/19 1728 03/19/19 0553  WBC 6.4 7.0 5.2  HGB 8.1* 7.8* 6.6*  HCT 24.5* 23.1* 20.2*  PLT 114* 112* 109*   BMET Recent Labs    03/17/19 0805 03/18/19 0623 03/19/19 0410  NA 134* 136 133*  K 3.2* 2.7* 3.1*  CL 99 100 100  CO2 24 23 22   GLUCOSE 127* 140* 73  BUN 76* 73* 63*  CREATININE 2.29* 2.24* 2.12*  CALCIUM 7.9* 8.1* 7.6*   Scheduled Meds: . amiodarone  200 mg Oral Daily  . atorvastatin  80 mg Oral q1800  . carvedilol  3.125 mg Oral BID WC  . insulin aspart   0-15 Units Subcutaneous TID WC  . insulin glargine  12 Units Subcutaneous Daily  . pantoprazole (PROTONIX) IV  40 mg Intravenous Q12H  . sodium chloride flush  3 mL Intravenous Q12H  . sodium chloride flush  3 mL Intravenous Q12H   Continuous Infusions: . sodium chloride    . dextrose 20 mL/hr at 03/19/19 0631   PRN Meds:.sodium chloride, acetaminophen **OR** acetaminophen, ondansetron **OR** ondansetron (ZOFRAN) IV, sodium chloride flush, traMADol  ASSESMENT:   *  Acute GIB 03/17/19 EGD: esophagitis/?short seg barretts.  HH.  CG/blood in stomach. Clean based GU.  Multiple DUs, 1 with VV treated with epi, cautery, endoclip.  No recurrent melena, no nausea.  Vitals stable though persistent hypotension for > 24 hours.    *   Blood loss anemia.  Hgb 12.2 >> 8.1 >> 6.6.  1 U PRBC ordered, the first to date.      *   Hypokalemia.    *   Thrombocytopenia.  Non-critical.   Stable   *   Azotemia, improved.  AKI improved.  CKD stage 3-4.      PLAN   *   Transfuse.  Continue IV Protonix BID.   CBC in AM.  hgb at 2 PM.  .  Azucena Freed  03/19/2019, 10:30 AM Phone [phone]

## 2019-03-19 NOTE — Progress Notes (Addendum)
PROGRESS NOTE    Aaron Mclean  HWE:[id number] DOB: Jun 04, 1951 DOA: 03/16/2019 PCP: Marco Collie, MD    Brief Narrative: [name] a 68 y.o.malewith medical history significant ofdiabetes type 2 uncontrolled with peripheral vascular disease status post toe amputations, atrial fibrillation, dementia, chronic kidney disease stage III, struct of sleep apnea on CPAP, hypertension, dyslipidemia and congestive heart failure with an ejection fraction of 10 to 15% thought worsened by PVCs. Patient was last discharged from our facility in December 2019 when he was placed on amiodarone in hopes that they could suppress his PVCs and improve his ejection fraction. Follow-up was planned but patient was lost to follow-up when [facility] [place].     Patient was sent from the nursing facility to [facility] on morning of 4/5 area early this morning patient complained of dark stools at the facility Was not anticoagulated.   Previous colonoscopy done over a decade ago demonstrated diverticulosis and internal hemorrhoids.  Hemoglobin at 12.7, was 15.4 at facility.  Transferred to [facility] due to lack of GI coverage at [facility].  Over the next 24 hours, hemoglobin has continued to trend downward and currently at 8.5 with increase in patient's creatinine.  Patient himself is quite somnolent.  Patient taken for endoscopy noting multiple gastric and duodenal ulcers, nonbleeding.    Assessment & Plan:   Principal Problem:   Diverticulosis of intestine with bleeding Active Problems:   Benign prostatic hyperplasia with urinary obstruction   Chronic systolic CHF (congestive heart failure), NYHA class 4 (HCC)   OSA on CPAP   DM (diabetes mellitus), type 2 with peripheral vascular complications (HCC)   CKD (chronic kidney disease) stage 3, GFR 30-59 ml/min (HCC)   Essential hypertension   Dyslipidemia   Pressure injury of skin   Goals of care,  counseling/discussion   Palliative care by specialist   1-GI bleed upper; related to multiples duodenal ulcer with VV bleeding s/p injection cauterization, gastric ulcer erosive gastropathy, esophagitis, barrett esophagus.  -appreciate GI evaluation.  -continue with IV Protonix.  -hb further drop to 6.6. one unit PRBC ordered.  -no melena ro bloody stool reported.   Called by nurse, patient had large bloody BM. Will transfuse another unit PRBC. SBP stable in the 90.   2-Acute blood loss anemia; in setting of GI bleed, see problem one.   3-hypokalemia; replete orally. -  4-Dementia; stable.   5-Chronic systolic Heart failure EF 10--15 % Holding lasix due to GI bleed.  Hold IV fluids, patient will get blood transfusion.  Hold carvedilol.   OSA; CPAP.  Acute on chronic renal failure; stage III;  Relate to hypovolemia.  Prior cr 1.5.  Cr plateau at 2.1.   Hypotension; blood transfusion.  High 90.  Hold carvedilol.   Hypoglycemia; poor oral intake. Improved.   PVC; on amiodarone.    Pressure injury of skin wound breakdown noted on bridge of patient's nose.  Noted by nursing, present on admission.  Gel barrier placed   Pressure Injury 03/16/19 Unstageable - Full thickness tissue loss in which the base of the ulcer is covered by slough (yellow, tan, gray, green or brown) and/or eschar (tan, brown or black) in the wound bed. small shallow ulceration with yellowish base. (Active)  03/16/19 1230  Location:   Location Orientation:   Staging: Unstageable - Full thickness tissue loss in which the base of the ulcer is covered by slough (yellow, tan, gray, green or brown) and/or eschar (tan, brown  or black) in the wound bed.  Wound Description (Comments): small shallow ulceration with yellowish base.  Present on Admission: Yes     Estimated body mass index is 26.14 kg/m as calculated from the following:   Height as of this encounter: 5\' 9"  (1.753 m).   Weight as of this  encounter: 80.3 kg.   DVT prophylaxis: scd Code Status: DNR Family Communication: care discussed with son Disposition Plan:  Remain in the hospital for treatment of GI bleed.    Consultants:   GI   Procedures:   Endoscopy;    Antimicrobials:none   Subjective: Denies abdominal pain. Denies black stool  Objective: Vitals:   03/19/19 0821 03/19/19 0854 03/19/19 1019 03/19/19 1040  BP:  (!) 97/53 (!) 90/54 (!) 92/53  Pulse: 83 71 91 93  Resp:  18 18 17   Temp:  (!) 97.5 F (36.4 C) 98.1 F (36.7 C) 97.7 F (36.5 C)  TempSrc:  Oral Oral Oral  SpO2:  96% 100%   Weight:      Height:        Intake/Output Summary (Last 24 hours) at 03/19/2019 1332 Last data filed at 03/19/2019 1211 Gross per 24 hour  Intake 3292.9 ml  Output 900 ml  Net 2392.9 ml   Filed Weights   03/17/19 1305 03/17/19 2025  Weight: 81.2 kg 80.3 kg    Examination:  General exam: Appears calm and comfortable  Respiratory system: Clear to auscultation. Respiratory effort normal. Cardiovascular system: S1 & S2 heard, RRR. No JVD, murmurs, rubs, gallops or clicks. No pedal edema. Gastrointestinal system: Abdomen is nondistended, soft and nontender. No organomegaly or masses felt. Normal bowel sounds heard. Central nervous system: Alert and oriented. No focal neurological deficits. Extremities: Symmetric 5 x 5 power. Skin: No rashes, lesions or ulcers Psychiatry: Judgement and insight appear normal. Mood & affect appropriate.     Data Reviewed: I have personally reviewed following labs and imaging studies  CBC: Recent Labs  Lab 03/17/19 0050 03/17/19 0805 03/17/19 1628 03/18/19 0623 03/18/19 1728 03/19/19 0553  WBC  --  7.3  --  6.4 7.0 5.2  HGB 9.0* 8.5* 7.8* 8.1* 7.8* 6.6*  HCT 27.0* 25.3*  --  24.5* 23.1* 20.2*  MCV  --  81.1  --  81.7 82.2 81.8  PLT  --  123*  --  114* 112* 433*   Basic Metabolic Panel: Recent Labs  Lab 03/17/19 0805 03/18/19 0623 03/19/19 0410  NA 134* 136  133*  K 3.2* 2.7* 3.1*  CL 99 100 100  CO2 24 23 22   GLUCOSE 127* 140* 73  BUN 76* 73* 63*  CREATININE 2.29* 2.24* 2.12*  CALCIUM 7.9* 8.1* 7.6*  MG 1.5*  --   --    GFR: Estimated Creatinine Clearance: 32.9 mL/min (A) (by C-G formula based on SCr of 2.12 mg/dL (H)). Liver Function Tests: No results for input(s): AST, ALT, ALKPHOS, BILITOT, PROT, ALBUMIN in the last 168 hours. No results for input(s): LIPASE, AMYLASE in the last 168 hours. No results for input(s): AMMONIA in the last 168 hours. Coagulation Profile: No results for input(s): INR, PROTIME in the last 168 hours. Cardiac Enzymes: No results for input(s): CKTOTAL, CKMB, CKMBINDEX, TROPONINI in the last 168 hours. BNP (last 3 results) No results for input(s): PROBNP in the last 8760 hours. HbA1C: No results for input(s): HGBA1C in the last 72 hours. CBG: Recent Labs  Lab 03/18/19 1145 03/18/19 1629 03/18/19 2127 03/19/19 0716 03/19/19 1130  GLUCAP 187*  110* 107* 110* 157*   Lipid Profile: No results for input(s): CHOL, HDL, LDLCALC, TRIG, CHOLHDL, LDLDIRECT in the last 72 hours. Thyroid Function Tests: No results for input(s): TSH, T4TOTAL, FREET4, T3FREE, THYROIDAB in the last 72 hours. Anemia Panel: No results for input(s): VITAMINB12, FOLATE, FERRITIN, TIBC, IRON, RETICCTPCT in the last 72 hours. Sepsis Labs: No results for input(s): PROCALCITON, LATICACIDVEN in the last 168 hours.  Recent Results (from the past 240 hour(s))  MRSA PCR Screening     Status: None   Collection Time: 03/16/19  3:46 PM  Result Value Ref Range Status   MRSA by PCR NEGATIVE NEGATIVE Final    Comment:        The GeneXpert MRSA Assay (FDA approved for NASAL specimens only), is one component of a comprehensive MRSA colonization surveillance program. It is not intended to diagnose MRSA infection nor to guide or monitor treatment for MRSA infections. Performed at [facility], [place] [address], [place], [place] [address]           Radiology Studies: No results found.      Scheduled Meds: . amiodarone  200 mg Oral Daily  . atorvastatin  80 mg Oral q1800  . carvedilol  3.125 mg Oral BID WC  . insulin aspart  0-15 Units Subcutaneous TID WC  . insulin glargine  12 Units Subcutaneous Daily  . pantoprazole (PROTONIX) IV  40 mg Intravenous Q12H  . sodium chloride flush  3 mL Intravenous Q12H  . sodium chloride flush  3 mL Intravenous Q12H   Continuous Infusions: . sodium chloride    . dextrose 20 mL/hr at 03/19/19 0631     LOS: 3 days    Time spent: 35 minutes.     Elmarie Shiley, MD [facility] Pager [phone] If 7PM-7AM, please contact night-coverage [email or web address] Password [email or web address] 03/19/2019, 1:32 PM

## 2019-03-19 NOTE — Progress Notes (Signed)
On call provider made aware. Pt Hemoglobin-6.6.

## 2019-03-19 NOTE — Progress Notes (Signed)
Physical Therapy Treatment Patient Details Name: Aaron Mclean MRN: [id number] DOB: [birth year] Today's Date: 03/19/2019    History of Present Illness Pt is a 68 y/o male admitted secondary to diverticulosis of intestine and anemia. Pt is s/p EGD that revelaed duodenal and gastric ulcers. PMH includes CHF, OSA on CPAP, DM, CKD, HTN, a fib, and dementia.     PT Comments    Patient session limited by extensive pericare, RN alerted large bloody stool. Pt agreeable for therapy, will attempt standing transfer with assist of Stedy next visit.     Follow Up Recommendations  SNF;Supervision/Assistance - 24 hour     Equipment Recommendations  None recommended by PT    Recommendations for Other Services       Precautions / Restrictions Precautions Precautions: Fall Restrictions Weight Bearing Restrictions: No    Mobility  Bed Mobility Overal bed mobility: Needs Assistance Bed Mobility: Supine to Sit;Sit to Supine;Rolling Rolling: Min assist   Supine to sit: Mod assist Sit to supine: Mod assist   General bed mobility comments: Mod A for assist with trunk management and LE management. Once sitting at EOB, pt with difficulty maintaining balance secondary to R lateral lean. Required mod-max A to maintain sitting balance. Further mobility deferred as unsafe to attempt with +1 assist. Noted pt's bed was soiled upon return to supine, therefore practiced rolling for linen change. REquired min A for assist with rolling.   Transfers                    Ambulation/Gait                 Stairs             Wheelchair Mobility    Modified Rankin (Stroke Patients Only)       Balance Overall balance assessment: Needs assistance Sitting-balance support: Bilateral upper extremity supported;Feet supported Sitting balance-[name]: Poor Sitting balance - Comments: Reliant on mod to max A to maintain sitting balance secondary to R lateral lean.  Postural control: Right  lateral lean                                  Cognition Arousal/Alertness: Awake/alert Behavior During Therapy: WFL for tasks assessed/performed Overall Cognitive Status: History of cognitive impairments - at baseline                                 General Comments: Pt oriented to person and situation. Did not know where he was or the time.       Exercises      General Comments        Pertinent Vitals/Pain      Home Living                      Prior Function            PT Goals (current goals can now be found in the care plan section) Acute Rehab PT Goals Patient Stated Goal: to feel better PT Goal Formulation: With patient Time For Goal Achievement: 04/01/19 Potential to Achieve Goals: Fair    Frequency    Min 2X/week      PT Plan Current plan remains appropriate    Co-evaluation              AM-PAC PT "6 Clicks"  Mobility   Outcome Measure  Help needed turning from your back to your side while in a flat bed without using bedrails?: A Lot Help needed moving from lying on your back to sitting on the side of a flat bed without using bedrails?: A Lot Help needed moving to and from a bed to a chair (including a wheelchair)?: Total Help needed standing up from a chair using your arms (e.g., wheelchair or bedside chair)?: Total Help needed to walk in hospital room?: Total Help needed climbing 3-5 steps with a railing? : Total 6 Click Score: 8    End of Session Equipment Utilized During Treatment: Gait belt Activity Tolerance: Patient tolerated treatment well Patient left: in bed;with call bell/phone within reach;with bed alarm set Nurse Communication: Mobility status PT Visit Diagnosis: Unsteadiness on feet (R26.81);Muscle weakness (generalized) (M62.81);Difficulty in walking, not elsewhere classified (R26.2)     Time: 1510-1536 PT Time Calculation (min) (ACUTE ONLY): 26 min  Charges:  $Therapeutic Activity:  8-22 mins                     Reinaldo Berber, PT, DPT Acute Rehabilitation Services Pager: [phone] Office: [phone]    Reinaldo Berber 03/19/2019, 2:38 PM

## 2019-03-19 NOTE — Progress Notes (Signed)
Pt has large Bloody BM ~ 2:30 today.  SBP remains in 90s, no new tachycardia. hopitalist ordered 2nd PRBC  Plan EGD repeat tomorrow,  Clears ok, NPO after Midnight.  Azucena Freed PA-C

## 2019-03-19 NOTE — Evaluation (Signed)
Occupational Therapy Evaluation Patient Details Name: Aaron Mclean MRN: [id number] DOB: 1951-10-04 Today's Date: 03/19/2019    History of Present Illness Pt is a 68 y/o male admitted secondary to diverticulosis of intestine and anemia. Pt is s/p EGD that revelaed duodenal and gastric ulcers. PMH includes CHF, OSA on CPAP, DM, CKD, HTN, a fib, and dementia.    Clinical Impression   Pt PTA: from SNF and requiring assist for ADL and mobility with dementia at baseline. Pt currently. Limited by medical complexity found in bloody stool- washed up and performing extensive bed mobility to properly reposition and clean. Pt with decreased strength, activity tolerance and decreased ability to perform current ADL tasks. Pt kept at bed level as Hgb last checked was 6.6. Pt's UB ADL minA and maxA to [place] for LB ADL. Bed mobility with modA. Pt motivated to participate in further skilled OT and wants to become more independent. OT to follow acutely.    Follow Up Recommendations  SNF;Supervision/Assistance - 24 hour    Equipment Recommendations  Other (comment)(defer to final venue)    Recommendations for Other Services       Precautions / Restrictions Precautions Precautions: Fall;Other (comment) Precaution Comments: hgb Restrictions Weight Bearing Restrictions: No      Mobility Bed Mobility Overal bed mobility: Needs Assistance Bed Mobility: Supine to Sit;Sit to Supine;Rolling Rolling: Min assist   Supine to sit: Mod assist Sit to supine: Mod assist   General bed mobility comments: Mod A for assist with trunk management and LE management. Once sitting at EOB, pt with difficulty maintaining balance secondary to R lateral lean. Required mod-max A to maintain sitting balance. Further mobility deferred as unsafe to attempt with +1 assist. Noted pt's bed was soiled upon return to supine, therefore practiced rolling for linen change. REquired min A for assist with rolling.   Transfers                  General transfer comment: DNT. Pt in bloody stool and assisted RN for bed mobility and bathing    Balance Overall balance assessment: Needs assistance Sitting-balance support: Bilateral upper extremity supported;Feet supported Sitting balance-[name]: Poor Sitting balance - Comments: Reliant on mod to max A to maintain sitting balance secondary to R lateral lean.  Postural control: Right lateral lean                                 ADL either performed or assessed with clinical judgement   ADL Overall ADL's : Needs assistance/impaired Eating/Feeding: Set up;Sitting   Grooming: Wash/dry hands;Wash/dry face;Oral care;Min guard   Upper Body Bathing: Minimal assistance;Bed level   Lower Body Bathing: Maximal assistance;Bed level   Upper Body Dressing : Minimal assistance;Bed level   Lower Body Dressing: Maximal assistance;Bed level   Toilet Transfer: Total assistance;+2 for physical assistance;+2 for safety/equipment   Toileting- Clothing Manipulation and Hygiene: Total assistance;+2 for physical assistance;Bed level       Functional mobility during ADLs: +2 for physical assistance(to be determined)       Vision Baseline Vision/History: No visual deficits Vision Assessment?: No apparent visual deficits     Perception     Praxis      Pertinent Vitals/Pain Pain Assessment: No/denies pain     Hand Dominance Right   Extremity/Trunk Assessment Upper Extremity Assessment Upper Extremity Assessment: Generalized weakness   Lower Extremity Assessment Lower Extremity Assessment: Defer to PT evaluation;Generalized weakness  Cervical / Trunk Assessment Cervical / Trunk Assessment: Normal   Communication Communication Communication: No difficulties   Cognition Arousal/Alertness: Awake/alert Behavior During Therapy: WFL for tasks assessed/performed Overall Cognitive Status: History of cognitive impairments - at baseline                                  General Comments: Pt oriented to person and situation. Did not know where he was or the time.    General Comments  Pt found in bloody stool asking for assist to be cleaned. hgb 6.6 being transfused at this time. bed level activity performed.    Exercises     Shoulder Instructions      Home Living Family/patient expects to be discharged to:: Skilled nursing facility                                        Prior Functioning/Environment Level of Independence: Needs assistance  Gait / Transfers Assistance Needed: W/C use ADL's / Homemaking Assistance Needed: MinA for UB ADL and MaxA for ADL as pt fatigues easily and has cognitive dele            OT Problem List: Decreased strength;Decreased activity tolerance;Impaired balance (sitting and/or standing);Decreased safety awareness;Pain;Decreased cognition      OT Treatment/Interventions: Self-care/ADL training;Therapeutic exercise;Neuromuscular education;Energy conservation;Therapeutic activities;Patient/family education;Balance training    OT Goals(Current goals can be found in the care plan section) Acute Rehab OT Goals Patient Stated Goal: to feel better OT Goal Formulation: With patient Time For Goal Achievement: 04/02/19 Potential to Achieve Goals: Good ADL Goals Pt Will Perform Grooming: with modified independence;sitting Pt Will Perform Upper Body Bathing: with set-up;sitting Pt Will Perform Lower Body Dressing: with supervision;sit to/from stand Pt Will Transfer to Toilet: with supervision;squat pivot transfer;bedside commode Pt Will Perform Toileting - Clothing Manipulation and hygiene: with min assist;sit to/from stand Pt/caregiver will Perform Home Exercise Program: Both right and left upper extremity;With Supervision;With written HEP provided Additional ADL Goal #1: pt will state and implement 3 energy conservation strategies to use during ADL/IADL and mobility  OT  Frequency: Min 3X/week   Barriers to D/C:            Co-evaluation PT/OT/SLP Co-Evaluation/Treatment: Yes Reason for Co-Treatment: To address functional/ADL transfers;Complexity of the patient's impairments (multi-system involvement)   OT goals addressed during session: ADL's and self-care      AM-PAC OT "6 Clicks" Daily Activity     Outcome Measure Help from another person eating meals?: A Lot Help from another person taking care of personal grooming?: A Lot Help from another person toileting, which includes using toliet, bedpan, or urinal?: Total Help from another person bathing (including washing, rinsing, drying)?: Total Help from another person to put on and taking off regular upper body clothing?: A Lot Help from another person to put on and taking off regular lower body clothing?: Total 6 Click Score: 9   End of Session Nurse Communication: Mobility status  Activity Tolerance: Treatment limited secondary to medical complications (Comment)(GI bleed) Patient left: in bed;with call bell/phone within reach;with bed alarm set;with nursing/sitter in room  OT Visit Diagnosis: Unsteadiness on feet (R26.81);Muscle weakness (generalized) (M62.81);Other symptoms and signs involving cognitive function                Time: 1400-1436 OT Time Calculation (min): 36 min Charges:  OT General Charges $OT Visit: 1 Visit OT Evaluation $OT Eval Moderate Complexity: 1 Mod  Darryl Nestle) Marsa Aris OTR/L Acute Rehabilitation Services Pager: [phone] Office: [phone]   Fredda Hammed 03/19/2019, 3:05 PM

## 2019-03-19 NOTE — Progress Notes (Signed)
Pt potassium-3.1. On call provider made aware. Will continue to monitor.

## 2019-03-19 NOTE — Progress Notes (Signed)
Patient had a large bloody BM. V/s stable. Transfusion completed. MD notified

## 2019-03-20 ENCOUNTER — Encounter (HOSPITAL_COMMUNITY)
Admission: AD | Disposition: A | Discharge status: Skilled Nursing Facility | Payer: Self-pay | Source: Other Acute Inpatient Hospital | Attending: Internal Medicine

## 2019-03-20 LAB — CBC
HCT: 28.3 % — ABNORMAL LOW (ref 39.0–52.0)
Hemoglobin: 9.9 g/dL — ABNORMAL LOW (ref 13.0–17.0)
MCH: 29.2 pg (ref 26.0–34.0)
MCHC: 35 g/dL (ref 30.0–36.0)
MCV: 83.5 fL (ref 80.0–100.0)
Platelets: 117 10*3/uL — ABNORMAL LOW (ref 150–400)
RBC: 3.39 MIL/uL — ABNORMAL LOW (ref 4.22–5.81)
RDW: 15.4 % (ref 11.5–15.5)
WBC: 6.2 10*3/uL (ref 4.0–10.5)
nRBC: 0 % (ref 0.0–0.2)

## 2019-03-20 LAB — HEMOGLOBIN AND HEMATOCRIT, BLOOD
HCT: 28.9 % — ABNORMAL LOW (ref 39.0–52.0)
Hemoglobin: 10.2 g/dL — ABNORMAL LOW (ref 13.0–17.0)

## 2019-03-20 LAB — GLUCOSE, CAPILLARY
Glucose-Capillary: 54 mg/dL — ABNORMAL LOW (ref 70–99)
Glucose-Capillary: 59 mg/dL — ABNORMAL LOW (ref 70–99)
Glucose-Capillary: 142 mg/dL — ABNORMAL HIGH (ref 70–99)
Glucose-Capillary: 125 mg/dL — ABNORMAL HIGH (ref 70–99)
Glucose-Capillary: 104 mg/dL — ABNORMAL HIGH (ref 70–99)
Glucose-Capillary: 97 mg/dL (ref 70–99)
Glucose-Capillary: 168 mg/dL — ABNORMAL HIGH (ref 70–99)
Glucose-Capillary: 106 mg/dL — ABNORMAL HIGH (ref 70–99)

## 2019-03-20 LAB — TYPE AND SCREEN
ABO/RH(D): A POS
Antibody Screen: NEGATIVE
Unit division: 0
Unit division: 0

## 2019-03-20 LAB — BASIC METABOLIC PANEL
Anion gap: 11 (ref 5–15)
BUN: 51 mg/dL — ABNORMAL HIGH (ref 8–23)
CO2: 23 mmol/L (ref 22–32)
Calcium: 7.9 mg/dL — ABNORMAL LOW (ref 8.9–10.3)
Chloride: 101 mmol/L (ref 98–111)
Creatinine, Ser: 2.14 mg/dL — ABNORMAL HIGH (ref 0.61–1.24)
GFR calc Af Amer: 35 mL/min — ABNORMAL LOW (ref >60)
GFR calc non Af Amer: 30 mL/min — ABNORMAL LOW (ref >60)
Glucose, Bld: 105 mg/dL — ABNORMAL HIGH (ref 70–99)
Potassium: 2.9 mmol/L — ABNORMAL LOW (ref 3.5–5.1)
Sodium: 135 mmol/L (ref 135–145)

## 2019-03-20 LAB — BPAM RBC
Blood Product Expiration Date: 202004152359
Blood Product Expiration Date: 202004152359
ISSUE DATE / TIME: 202004081022
ISSUE DATE / TIME: 202004081526
Unit Type and Rh: 600
Unit Type and Rh: 600

## 2019-03-20 SURGERY — ESOPHAGOGASTRODUODENOSCOPY (EGD) WITH PROPOFOL
Anesthesia: Monitor Anesthesia Care

## 2019-03-20 MED ORDER — SODIUM CHLORIDE 0.9 % IV SOLN
510.0000 mg | Freq: Once | INTRAVENOUS | Status: AC
  Administered 2019-03-20: 510 mg via INTRAVENOUS
  Filled 2019-03-20: qty 17

## 2019-03-20 MED ORDER — POTASSIUM CHLORIDE CRYS ER 20 MEQ PO TBCR
40.0000 meq | EXTENDED_RELEASE_TABLET | Freq: Once | ORAL | Status: AC
  Administered 2019-03-20: 40 meq via ORAL
  Filled 2019-03-20: qty 2

## 2019-03-20 MED ORDER — DEXTROSE 50 % IV SOLN
INTRAVENOUS | Status: AC
  Filled 2019-03-20: qty 50

## 2019-03-20 MED ORDER — DEXTROSE 50 % IV SOLN
12.5000 g | INTRAVENOUS | Status: AC
  Administered 2019-03-20: 12.5 g via INTRAVENOUS

## 2019-03-20 MED ORDER — CARVEDILOL 3.125 MG PO TABS
3.1250 mg | ORAL_TABLET | Freq: Two times a day (BID) | ORAL | Status: DC
  Administered 2019-03-20 – 2019-03-24 (×7): 3.125 mg via ORAL
  Filled 2019-03-20 (×8): qty 1

## 2019-03-20 MED ORDER — PANTOPRAZOLE SODIUM 40 MG IV SOLR
40.0000 mg | Freq: Two times a day (BID) | INTRAVENOUS | Status: DC
  Administered 2019-03-20 – 2019-03-21 (×3): 40 mg via INTRAVENOUS
  Filled 2019-03-20 (×5): qty 40

## 2019-03-20 NOTE — Progress Notes (Signed)
Gastroenterology Inpatient Follow-up Note   PATIENT IDENTIFICATION  Aaron Mclean is a 68 y.o. male with a pmh significant for Afib (no AC), CHFrEF, DM, CRI, HTN, Hemorrhoids, PUD (GUs and DUs).  Hospitalized in setting of acute blood loss anemia and found to have multiple ulcers and erosions with a DU with VV s/p treatment. Hospital Day: 5  SUBJECTIVE  The patient was scheduled for a possible endoscopy today. His hemoglobin more than appropriately improved with 2 units of packed RBCs yesterday. He is not having any more bowel movements. His abdominal discomfort from yesterday has improved as well. He is up for any procedure if necessary but would hope to hold on repeating if he felt if he was safe. He feels that he is still having chills subjectively but not had any documented fevers.   OBJECTIVE  Scheduled Inpatient Medications:  . amiodarone  200 mg Oral Daily  . atorvastatin  80 mg Oral q1800  . dextrose      . insulin aspart  0-15 Units Subcutaneous TID WC  . potassium chloride  40 mEq Oral Once  . sodium chloride flush  3 mL Intravenous Q12H  . sodium chloride flush  3 mL Intravenous Q12H   Continuous Inpatient Infusions:  . sodium chloride     PRN Inpatient Medications: sodium chloride, acetaminophen **OR** acetaminophen, ondansetron **OR** ondansetron (ZOFRAN) IV, sodium chloride flush, traMADol   Physical Examination  Temp:  [97.7 F (36.5 C)-98.6 F (37 C)] 98.5 F (36.9 C) (04/08 1936) Pulse Rate:  [51-135] 70 (04/09 0546) Resp:  [17-20] 17 (04/09 0546) BP: (90-103)/(53-76) 103/73 (04/09 0546) SpO2:  [96 %-100 %] 98 % (04/09 0546) Weight:  [79.2 kg] 79.2 kg (04/08 1936) Temp (24hrs), Avg:98.1 F (36.7 C), Min:97.7 F (36.5 C), Max:98.6 F (37 C)  Weight: 79.2 kg GEN: NAD, resting comfortably in bed PSYCH: Cooperative EYE: Conjunctivae pink, sclerae anicteric ENT: Dry mucous membranes CV: Without tachycardia GI: NABS, soft, NT/ND, without rebound or  guarding MSK/EXT: Bilateral lower extremity edema present SKIN: No jaundice NEURO:  Alert & Oriented x 3, no focal deficits   Review of Data   Laboratory Studies   Recent Labs  Lab 03/17/19 0805  03/20/19 0856  NA 134*   < > 135  K 3.2*   < > 2.9*  CL 99   < > 101  CO2 24   < > 23  BUN 76*   < > 51*  CREATININE 2.29*   < > 2.14*  GLUCOSE 127*   < > 105*  CALCIUM 7.9*   < > 7.9*  MG 1.5*  --   --    < > = values in this interval not displayed.   No results for input(s): AST, ALT, GGT, ALKPHOS in the last 168 hours.  Invalid input(s): TBILI, CONJBILI, ALB  Recent Labs  Lab 03/18/19 1728 03/19/19 0553  03/20/19 0856  WBC 7.0 5.2  --  6.2  HGB 7.8* 6.6*   < > 9.9*  HCT 23.1* 20.2*   < > 28.3*  PLT 112* 109*  --  117*   < > = values in this interval not displayed.   Imaging Studies  No new relevant imaging to review  GI Procedures and Studies  No new GI studies to review   ASSESSMENT  Aaron Mclean is a 68 y.o. male with a pmh significant for Afib (no AC), CHFrEF, DM, CRI, HTN, Hemorrhoids, PUD (GUs and DUs).  Hospitalized in setting of acute  blood loss anemia and found to have multiple ulcers and erosions with a DU with VV s/p treatment.  The patient is hemodynamically stable and had improvement in his hemoglobin with 2 units of packed RBCs to the point that has been trended out and improved more than would be expected with just 2 units.  I suspect the bleeding from yesterday in regards to the melena was most likely old blood rather than persistent bleeding at this point in time.  The patient will require a follow-up endoscopy in approximately 2 to 3 months to reevaluate healing.  At this point I would hold on repeat endoscopy.  Patient was happy with hearing this.  Biopsies are still pending and if they return positive will require therapy/treatment.   PLAN/RECOMMENDATIONS  Trend hemoglobin/hematocrit into tomorrow Monitor bowel habits though suspect he may have some  dark stool for up to a week after his bleed PPI twice daily to be maintained until follow-up endoscopy Follow-up endoscopy to be scheduled in approximately 2.5 to 3 months IV iron should be administered on at least one occasion today during hospitalization and considered in a few weeks time by PCP Eventually should go on oral iron once daily to be maintained CBC should be repeated within a week of discharge   Dr. Scarlette Shorts takes over the GI service starting on 4/10.  We will sign off but please page/call with questions or concerns.   Aaron Britain, MD [facility] Advanced [facility]oscopy Office # [phone]    LOS: 4 days  Irving Copas  03/20/2019, 10:38 AM

## 2019-03-20 NOTE — Clinical Social Work Note (Signed)
CSW advised that patient had large bloody stool on Wed.(4/8) and is having an EGD today. Olivia Mackie, admissions director at [facility] contacted and advised that patient won't discharge today. CSW will continue to monitor patient's progress and will facilitate discharge to SNF once medically stable.  Joandry Slagter Givens, MSW, LCSW Licensed Clinical Social Worker [place] [phone]

## 2019-03-20 NOTE — Progress Notes (Signed)
Occupational Therapy Treatment Patient Details Name: Aaron Mclean MRN: [id number] DOB: 1951/12/03 Today's Date: 03/20/2019    History of present illness Pt is a 68 y/o male admitted secondary to diverticulosis of intestine and anemia. Pt is s/p EGD that revelaed duodenal and gastric ulcers. PMH includes CHF, OSA on CPAP, DM, CKD, HTN, a fib, and dementia.    OT comments  Pt eating in bed with HOB elevated. Pt performing bed mobility with minA overall and verbal cues for sequencing for proper technique. Pt performing sit to stand with minA and pt took a few steps from bed to recliner. Pt performing set-upA for feeding. Pt with a marked improvement and would greatly benefit from continued OT skilled services for ADL and mobility in SNF setting. OT acutely.    Follow Up Recommendations  SNF;Supervision/Assistance - 24 hour    Equipment Recommendations  Other (comment)(to be determined)    Recommendations for Other Services      Precautions / Restrictions Precautions Precautions: Fall;Other (comment) Precaution Comments: hgb Restrictions Weight Bearing Restrictions: No       Mobility Bed Mobility Overal bed mobility: Needs Assistance Bed Mobility: Supine to Sit;Sit to Supine;Rolling Rolling: Min assist            Transfers Overall transfer level: Needs assistance Equipment used: Rolling walker (2 wheeled) Transfers: Sit to/from [facility] Sit to Stand: Min assist Stand pivot transfers: Min assist       General transfer comment: Pt reports slight dizziness in standing, but resolves within a few seconds.    Balance Overall balance assessment: Needs assistance Sitting-balance support: Bilateral upper extremity supported;Feet supported Sitting balance-[name]: Fair Sitting balance - Comments: Reliant on mod to max A to maintain sitting balance secondary to R lateral lean.  Postural control: Right lateral lean   Standing balance-[name]:  Fair Standing balance comment: with RW for support               High Level Balance Comments: fair with RW           ADL either performed or assessed with clinical judgement   ADL Overall ADL's : Needs assistance/impaired Eating/Feeding: Set up;Sitting   Grooming: Wash/dry hands;Wash/dry face;Oral care;Min guard                               Functional mobility during ADLs: Minimal assistance;Rolling walker(to take a few steps from bed to recliner) General ADL Comments: Pt set-upA for feeding and pt increasing strength and has improved with overall ADL.     Vision       Perception     Praxis      Cognition Arousal/Alertness: Awake/alert Behavior During Therapy: WFL for tasks assessed/performed Overall Cognitive Status: History of cognitive impairments - at baseline                                 General Comments: Pt oriented to person and situation. Did not know where he was or the time.         Exercises     Shoulder Instructions       General Comments Hgb 9.9; pt much more stable today    Pertinent Vitals/ Pain          Home Living  Prior Functioning/Environment              Frequency  Min 3X/week        Progress Toward Goals  OT Goals(current goals can now be found in the care plan section)  Progress towards OT goals: Progressing toward goals  Acute Rehab OT Goals Patient Stated Goal: to feel better OT Goal Formulation: With patient Time For Goal Achievement: 04/02/19 Potential to Achieve Goals: Good ADL Goals Pt Will Perform Grooming: with modified independence;sitting Pt Will Perform Upper Body Bathing: with set-up;sitting Pt Will Perform Lower Body Dressing: with supervision;sit to/from stand Pt Will Transfer to Toilet: with supervision;squat pivot transfer;bedside commode Pt Will Perform Toileting - Clothing Manipulation and hygiene: with  min assist;sit to/from stand Pt/caregiver will Perform Home Exercise Program: Both right and left upper extremity;With Supervision;With written HEP provided  Plan Discharge plan remains appropriate    Co-evaluation    PT/OT/SLP Co-Evaluation/Treatment: Yes Reason for Co-Treatment: Complexity of the patient's impairments (multi-system involvement);To address functional/ADL transfers   OT goals addressed during session: ADL's and self-care      AM-PAC OT "6 Clicks" Daily Activity     Outcome Measure   Help from another person eating meals?: A Little Help from another person taking care of personal grooming?: A Little Help from another person toileting, which includes using toliet, bedpan, or urinal?: A Lot Help from another person bathing (including washing, rinsing, drying)?: A Lot Help from another person to put on and taking off regular upper body clothing?: A Little Help from another person to put on and taking off regular lower body clothing?: A Lot 6 Click Score: 15    End of Session Equipment Utilized During Treatment: Gait belt;Rolling walker  OT Visit Diagnosis: Unsteadiness on feet (R26.81);Muscle weakness (generalized) (M62.81);Other symptoms and signs involving cognitive function   Activity Tolerance Treatment limited secondary to medical complications (Comment)   Patient Left in chair;with call bell/phone within reach;with chair alarm set   Nurse Communication          Time: [phone] OT Time Calculation (min): 23 min  Charges: OT General Charges $OT Visit: 1 Visit OT Treatments $Self Care/Home Management : 8-22 mins  Aaron Mclean Acute Rehabilitation Services Pager: [phone] Office: [phone]   Aaron Mclean 03/20/2019, 1:08 PM

## 2019-03-20 NOTE — Progress Notes (Signed)
Pt CBG-59, 12.5 of D50 given, per protocol. Rechecked CBG-142. On call provider made aware. Will continue to monitor.

## 2019-03-20 NOTE — Consult Note (Signed)
   [facility] CM Inpatient Consult   03/20/2019  CLEVE PAOLILLO [birth year] [id number]    Follow-up note:  Transition of care social worker note reviewed and disposition is to return back to skilled nursing facility ([facility] [place]) where patient is a resident and states will facilitate discharge to SNF once medically stable.   No [facility] care management needs identified at this point.    For questions, please call:  Edwena Felty A. Annelie Boak, BSN, RN-BC [facility] Liaison Cell: [phone]

## 2019-03-20 NOTE — Progress Notes (Signed)
PROGRESS NOTE    Aaron Mclean  NWG:[id number] DOB: August 09, 1951 DOA: 03/16/2019 PCP: Marco Collie, MD    Brief Narrative: Aaron Mclean a 68 y.o.malewith medical history significant ofdiabetes type 2 uncontrolled with peripheral vascular disease status post toe amputations, atrial fibrillation, dementia, chronic kidney disease stage III, struct of sleep apnea on CPAP, hypertension, dyslipidemia and congestive heart failure with an ejection fraction of 10 to 15% thought worsened by PVCs. Patient was last discharged from our facility in December 2019 when he was placed on amiodarone in hopes that they could suppress his PVCs and improve his ejection fraction. Follow-up was planned but patient was lost to follow-up when he entered [facility] [place].     Patient was sent from the nursing facility to [facility] on morning of 4/5 area early this morning patient complained of dark stools at the facility Was not anticoagulated.   Previous colonoscopy done over a decade ago demonstrated diverticulosis and internal hemorrhoids.  Hemoglobin at 12.7, was 15.4 at facility.  Transferred to [facility] due to lack of GI coverage at [facility]  Over the next 24 hours, hemoglobin has continued to trend downward and currently at 8.5 with increase in patient's creatinine.  Patient himself is quite somnolent.  Patient taken for endoscopy noting multiple gastric and duodenal ulcers, nonbleeding.    Assessment & Plan:   Principal Problem:   Diverticulosis of intestine with bleeding Active Problems:   Benign prostatic hyperplasia with urinary obstruction   Chronic systolic CHF (congestive heart failure), NYHA class 4 (HCC)   OSA on CPAP   DM (diabetes mellitus), type 2 with peripheral vascular complications (HCC)   CKD (chronic kidney disease) stage 3, GFR 30-59 ml/min (HCC)   Essential hypertension   Dyslipidemia   Pressure injury of skin   Goals of care,  counseling/discussion   Palliative care by specialist   1-GI bleed upper; related to multiples duodenal ulcer with VV bleeding s/p injection cauterization, gastric ulcer erosive gastropathy, esophagitis, barrett esophagus.  -appreciate GI evaluation.  -continue with IV Protonix.  -hb further drop to 6.6. one unit PRBC ordered.  -no melena ro bloody stool reported today.  Hb increase appropriately to 9.9.  No plan for endoscopy today. Plan to repeat hb tomorrow am.    2-Acute blood loss anemia; in setting of GI bleed, see problem one.  S/P 2 units PRBC on 03-19-2019. Hb increase to 9.9   3-hypokalemia; will give 20 meq times one.   4-Dementia; stable.   5-Chronic systolic Heart failure EF 10--15 % Holding lasix due to GI bleed.  Hold IV fluids, patient will get blood transfusion.  Will resume carvedilol.   OSA; CPAP.  Acute on chronic renal failure; stage III;  Relate to hypovolemia.  Prior cr 1.5. --1.7 Cr plateau at 2.1.  He has had multiples episodes of urine retention, will proceed with foley catheter placement.   Hypotension; blood transfusion.  High 90.  bp improved.   Hypoglycemia; poor oral intake. Hypoglycemia last night. Will discontinue lantus.   PVC; on amiodarone.    Pressure injury of skin wound breakdown noted on bridge of patient's nose.  Noted by nursing, present on admission.  Gel barrier placed   Pressure Injury 03/16/19 Unstageable - Full thickness tissue loss in which the base of the ulcer is covered by slough (yellow, tan, gray, green or brown) and/or eschar (tan, brown or black) in the wound bed. small shallow ulceration with yellowish base. (Active)  03/16/19 1230  Location:   Location Orientation:   Staging: Unstageable - Full thickness tissue loss in which the base of the ulcer is covered by slough (yellow, tan, gray, green or brown) and/or eschar (tan, brown or black) in the wound bed.  Wound Description (Comments): small shallow ulceration  with yellowish base.  Present on Admission: Yes     Estimated body mass index is 25.78 kg/m as calculated from the following:   Height as of this encounter: 5\' 9"  (1.753 m).   Weight as of this encounter: 79.2 kg.   DVT prophylaxis: scd Code Status: DNR Family Communication: care discussed with son Disposition Plan:  Remain in the hospital for treatment of GI bleed.    Consultants:   GI   Procedures:   Endoscopy;    Antimicrobials:none   Subjective: No bloody BM since yesterday. He denies abdominal pain/   Objective: Vitals:   03/19/19 1553 03/19/19 1924 03/19/19 1936 03/20/19 0546  BP: 90/61 90/74 100/76 103/73  Pulse: (!) 53 95 (!) 51 70  Resp: 18 20 19 17   Temp: 98 F (36.7 C) 98.6 F (37 C) 98.5 F (36.9 C)   TempSrc: Oral Oral Oral   SpO2: 96% 97% 99% 98%  Weight:   79.2 kg   Height:        Intake/Output Summary (Last 24 hours) at 03/20/2019 1219 Last data filed at 03/20/2019 1000 Gross per 24 hour  Intake 1399.33 ml  Output 450 ml  Net 949.33 ml   Filed Weights   03/17/19 1305 03/17/19 2025 03/19/19 1936  Weight: 81.2 kg 80.3 kg 79.2 kg    Examination:  General exam: Appears calm and comfortable.  Respiratory system: Crackles bases Cardiovascular system: S 1, S 2 RRR. Gastrointestinal system: BS present, soft, nt Central nervous system: alert, pleasantly confuse Extremities: symmetric power.  Skin: no rashes   Data Reviewed: I have personally reviewed following labs and imaging studies  CBC: Recent Labs  Lab 03/17/19 0805  03/18/19 0623 03/18/19 1728 03/19/19 0553 03/19/19 2005 03/19/19 2228 03/20/19 0856  WBC 7.3  --  6.4 7.0 5.2  --   --  6.2  HGB 8.5*   < > 8.1* 7.8* 6.6* 10.0* 10.2* 9.9*  HCT 25.3*  --  24.5* 23.1* 20.2* 27.9* 28.9* 28.3*  MCV 81.1  --  81.7 82.2 81.8  --   --  83.5  PLT 123*  --  114* 112* 109*  --   --  117*   < > = values in this interval not displayed.   Basic Metabolic Panel: Recent Labs  Lab  03/17/19 0805 03/18/19 0623 03/19/19 0410 03/20/19 0856  NA 134* 136 133* 135  K 3.2* 2.7* 3.1* 2.9*  CL 99 100 100 101  CO2 24 23 22 23   GLUCOSE 127* 140* 73 105*  BUN 76* 73* 63* 51*  CREATININE 2.29* 2.24* 2.12* 2.14*  CALCIUM 7.9* 8.1* 7.6* 7.9*  MG 1.5*  --   --   --    GFR: Estimated Creatinine Clearance: 32.6 mL/min (A) (by C-G formula based on SCr of 2.14 mg/dL (H)). Liver Function Tests: No results for input(s): AST, ALT, ALKPHOS, BILITOT, PROT, ALBUMIN in the last 168 hours. No results for input(s): LIPASE, AMYLASE in the last 168 hours. No results for input(s): AMMONIA in the last 168 hours. Coagulation Profile: No results for input(s): INR, PROTIME in the last 168 hours. Cardiac Enzymes: No results for input(s): CKTOTAL, CKMB, CKMBINDEX, TROPONINI in the last 168 hours. BNP (  last 3 results) No results for input(s): PROBNP in the last 8760 hours. HbA1C: No results for input(s): HGBA1C in the last 72 hours. CBG: Recent Labs  Lab 03/20/19 0311 03/20/19 0337 03/20/19 0557 03/20/19 0758 03/20/19 1144  GLUCAP 59* 142* 125* 104* 97   Lipid Profile: No results for input(s): CHOL, HDL, LDLCALC, TRIG, CHOLHDL, LDLDIRECT in the last 72 hours. Thyroid Function Tests: No results for input(s): TSH, T4TOTAL, FREET4, T3FREE, THYROIDAB in the last 72 hours. Anemia Panel: No results for input(s): VITAMINB12, FOLATE, FERRITIN, TIBC, IRON, RETICCTPCT in the last 72 hours. Sepsis Labs: No results for input(s): PROCALCITON, LATICACIDVEN in the last 168 hours.  Recent Results (from the past 240 hour(s))  MRSA PCR Screening     Status: None   Collection Time: 03/16/19  3:46 PM  Result Value Ref Range Status   MRSA by PCR NEGATIVE NEGATIVE Final    Comment:        The GeneXpert MRSA Assay (FDA approved for NASAL specimens only), is one component of a comprehensive MRSA colonization surveillance program. It is not intended to diagnose MRSA infection nor to guide or  monitor treatment for MRSA infections. Performed at [facility], [place] [address] [place], [place] [address]          Radiology Studies: No results found.      Scheduled Meds: . amiodarone  200 mg Oral Daily  . atorvastatin  80 mg Oral q1800  . dextrose      . insulin aspart  0-15 Units Subcutaneous TID WC  . potassium chloride  40 mEq Oral Once  . sodium chloride flush  3 mL Intravenous Q12H  . sodium chloride flush  3 mL Intravenous Q12H   Continuous Infusions: . sodium chloride       LOS: 4 days    Time spent: 35 minutes.     Elmarie Shiley, MD [facility] Pager [phone] If 7PM-7AM, please contact night-coverage [email or web address] Password [email or web address] 03/20/2019, 12:19 PM

## 2019-03-20 NOTE — Care Management Important Message (Signed)
Important Message  Patient Details  Name: Aaron Mclean MRN: [id number] Date of Birth: [birth year]   Medicare Important Message Given:  Yes    [name] 03/20/2019, 3:52 PM

## 2019-03-20 NOTE — Progress Notes (Signed)
Physical Therapy Treatment Patient Details Name: Aaron Mclean MRN: [id number] DOB: 1951/11/06 Today's Date: 03/20/2019    History of Present Illness Pt is a 68 y/o male admitted secondary to diverticulosis of intestine and anemia. Pt is s/p EGD that revelaed duodenal and gastric ulcers. PMH includes CHF, OSA on CPAP, DM, CKD, HTN, a fib, and dementia.     PT Comments    Patient progressing with therapy today, now standing and transferring to bedside chair for lunch with min A. Pt agreeable to ambulate next visit with close chair follow.     Follow Up Recommendations  SNF;Supervision/Assistance - 24 hour     Equipment Recommendations  None recommended by PT    Recommendations for Other Services       Precautions / Restrictions Precautions Precautions: Fall;Other (comment) Precaution Comments: hgb Restrictions Weight Bearing Restrictions: No    Mobility  Bed Mobility Overal bed mobility: Needs Assistance Bed Mobility: Supine to Sit;Sit to Supine;Rolling Rolling: Min assist            Transfers Overall transfer level: Needs assistance Equipment used: Rolling walker (2 wheeled) Transfers: Sit to/from [facility] Sit to Stand: Min assist Stand pivot transfers: Min assist       General transfer comment: Min A to stand and transfer to chair. patient impressed with himself as he did not believe he was going to be able to stand.   Ambulation/Gait                 Stairs             Wheelchair Mobility    Modified Rankin (Stroke Patients Only)       Balance Overall balance assessment: Needs assistance Sitting-balance support: Bilateral upper extremity supported;Feet supported Sitting balance-[name]: Poor Sitting balance - Comments: Reliant on mod to max A to maintain sitting balance secondary to R lateral lean.  Postural control: Right lateral lean                                  Cognition Arousal/Alertness:  Awake/alert Behavior During Therapy: WFL for tasks assessed/performed Overall Cognitive Status: History of cognitive impairments - at baseline                                 General Comments: Pt oriented to person and situation. Did not know where he was or the time.       Exercises      General Comments        Pertinent Vitals/Pain      Home Living                      Prior Function            PT Goals (current goals can now be found in the care plan section) Acute Rehab PT Goals Patient Stated Goal: to feel better PT Goal Formulation: With patient Time For Goal Achievement: 04/01/19 Potential to Achieve Goals: Fair Progress towards PT goals: Progressing toward goals    Frequency    Min 2X/week      PT Plan Current plan remains appropriate    Co-evaluation PT/OT/SLP Co-Evaluation/Treatment: Yes Reason for Co-Treatment: To address functional/ADL transfers;Necessary to address cognition/behavior during functional activity          AM-PAC PT "6 Clicks" Mobility  Outcome Measure  Help needed turning from your back to your side while in a flat bed without using bedrails?: A Little Help needed moving from lying on your back to sitting on the side of a flat bed without using bedrails?: A Little Help needed moving to and from a bed to a chair (including a wheelchair)?: A Lot Help needed standing up from a chair using your arms (e.g., wheelchair or bedside chair)?: A Lot Help needed to walk in hospital room?: A Lot Help needed climbing 3-5 steps with a railing? : Total 6 Click Score: 13    End of Session Equipment Utilized During Treatment: Gait belt Activity Tolerance: Patient tolerated treatment well Patient left: in bed;with call bell/phone within reach;with bed alarm set Nurse Communication: Mobility status PT Visit Diagnosis: Unsteadiness on feet (R26.81);Muscle weakness (generalized) (M62.81);Difficulty in walking, not  elsewhere classified (R26.2)     Time: 3007-6226 PT Time Calculation (min) (ACUTE ONLY): 23 min  Charges:  $Therapeutic Activity: 8-22 mins                     Reinaldo Berber, PT, DPT Acute Rehabilitation Services Pager: [phone] Office: [phone]     Reinaldo Berber 03/20/2019, 12:14 PM

## 2019-03-21 ENCOUNTER — Inpatient Hospital Stay (HOSPITAL_COMMUNITY): Payer: PPO

## 2019-03-21 ENCOUNTER — Other Ambulatory Visit: Payer: Self-pay

## 2019-03-21 LAB — BASIC METABOLIC PANEL
Anion gap: 11 (ref 5–15)
BUN: 48 mg/dL — ABNORMAL HIGH (ref 8–23)
CO2: 22 mmol/L (ref 22–32)
Calcium: 8.1 mg/dL — ABNORMAL LOW (ref 8.9–10.3)
Chloride: 101 mmol/L (ref 98–111)
Creatinine, Ser: 2.33 mg/dL — ABNORMAL HIGH (ref 0.61–1.24)
GFR calc Af Amer: 32 mL/min — ABNORMAL LOW (ref >60)
GFR calc non Af Amer: 27 mL/min — ABNORMAL LOW (ref >60)
Glucose, Bld: 121 mg/dL — ABNORMAL HIGH (ref 70–99)
Potassium: 3.4 mmol/L — ABNORMAL LOW (ref 3.5–5.1)
Sodium: 134 mmol/L — ABNORMAL LOW (ref 135–145)

## 2019-03-21 LAB — CBC
HCT: 27.9 % — ABNORMAL LOW (ref 39.0–52.0)
Hemoglobin: 10.1 g/dL — ABNORMAL LOW (ref 13.0–17.0)
MCH: 30.2 pg (ref 26.0–34.0)
MCHC: 36.2 g/dL — ABNORMAL HIGH (ref 30.0–36.0)
MCV: 83.5 fL (ref 80.0–100.0)
Platelets: 145 10*3/uL — ABNORMAL LOW (ref 150–400)
RBC: 3.34 MIL/uL — ABNORMAL LOW (ref 4.22–5.81)
RDW: 16 % — ABNORMAL HIGH (ref 11.5–15.5)
WBC: 6.9 10*3/uL (ref 4.0–10.5)
nRBC: 0 % (ref 0.0–0.2)

## 2019-03-21 LAB — GLUCOSE, CAPILLARY
Glucose-Capillary: 95 mg/dL (ref 70–99)
Glucose-Capillary: 148 mg/dL — ABNORMAL HIGH (ref 70–99)
Glucose-Capillary: 131 mg/dL — ABNORMAL HIGH (ref 70–99)
Glucose-Capillary: 97 mg/dL (ref 70–99)
Glucose-Capillary: 91 mg/dL (ref 70–99)
Glucose-Capillary: 89 mg/dL (ref 70–99)

## 2019-03-21 LAB — CREATININE, URINE, RANDOM: Creatinine, Urine: 92.47 mg/dL

## 2019-03-21 LAB — SODIUM, URINE, RANDOM: Sodium, Ur: <10 mmol/L

## 2019-03-21 MED ORDER — FUROSEMIDE 10 MG/ML IJ SOLN
20.0000 mg | Freq: Once | INTRAMUSCULAR | Status: AC
  Administered 2019-03-21: 20 mg via INTRAVENOUS
  Filled 2019-03-21: qty 2

## 2019-03-21 MED ORDER — POTASSIUM CHLORIDE CRYS ER 20 MEQ PO TBCR
40.0000 meq | EXTENDED_RELEASE_TABLET | Freq: Once | ORAL | Status: AC
  Administered 2019-03-21: 40 meq via ORAL
  Filled 2019-03-21: qty 2

## 2019-03-21 MED ORDER — FUROSEMIDE 10 MG/ML IJ SOLN
40.0000 mg | Freq: Once | INTRAMUSCULAR | Status: AC
  Administered 2019-03-21: 40 mg via INTRAVENOUS
  Filled 2019-03-21: qty 4

## 2019-03-21 MED ORDER — POTASSIUM CHLORIDE CRYS ER 20 MEQ PO TBCR
20.0000 meq | EXTENDED_RELEASE_TABLET | Freq: Two times a day (BID) | ORAL | Status: DC
  Administered 2019-03-21 – 2019-03-22 (×2): 20 meq via ORAL
  Filled 2019-03-21 (×2): qty 1

## 2019-03-21 NOTE — Progress Notes (Signed)
PROGRESS NOTE    Aaron Mclean  YDX:[id number] DOB: [birth year] DOA: 03/16/2019 PCP: Marco Collie, MD    Brief Narrative: Aaron Mclean a 68 y.o.malewith medical history significant ofdiabetes type 2 uncontrolled with peripheral vascular disease status post toe amputations, atrial fibrillation, dementia, chronic kidney disease stage III, struct of sleep apnea on CPAP, hypertension, dyslipidemia and congestive heart failure with an ejection fraction of 10 to 15% thought worsened by PVCs. Patient was last discharged from our facility in December 2019 when he was placed on amiodarone in hopes that they could suppress his PVCs and improve his ejection fraction. Follow-up was planned but patient was lost to follow-up when he entered [facility] [place].     Patient was sent from the nursing facility to [facility] on morning of 4/5 area early this morning patient complained of dark stools at the facility Was not anticoagulated.   Previous colonoscopy done over a decade ago demonstrated diverticulosis and internal hemorrhoids.  Hemoglobin at 12.7, was 15.4 at facility.  Transferred to [facility] due to lack of GI coverage at [facility].  Over the next 24 hours, hemoglobin has continued to trend downward and currently at 8.5 with increase in patient's creatinine.  Patient himself is quite somnolent.  Patient taken for endoscopy noting multiple gastric and duodenal ulcers, nonbleeding.    Assessment & Plan:   Principal Problem:   Diverticulosis of intestine with bleeding Active Problems:   Benign prostatic hyperplasia with urinary obstruction   Chronic systolic CHF (congestive heart failure), NYHA class 4 (HCC)   OSA on CPAP   DM (diabetes mellitus), type 2 with peripheral vascular complications (HCC)   CKD (chronic kidney disease) stage 3, GFR 30-59 ml/min (HCC)   Essential hypertension   Dyslipidemia   Pressure injury of skin   Goals of care,  counseling/discussion   Palliative care by specialist   1-GI bleed upper; related to multiples duodenal ulcer with VV bleeding s/p injection cauterization, gastric ulcer erosive gastropathy, esophagitis, barrett esophagus.  -appreciate GI evaluation.  -continue with IV Protonix.  -hb further drop to 6.6. one unit PRBC ordered.  -no melena ro bloody stool reported today.  Hb increase appropriately to 9.9.  No further bleeding.    2-Acute blood loss anemia; in setting of GI bleed, see problem one.  S/P 2 units PRBC on 03-19-2019. Hb stable at 10  3-hypokalemia; replete orally.   4-Dementia; stable.   5-Acute on Chronic systolic Heart failure EF 10--15 % He is complaining of SOB, Chest x ray with asymmetric edema. Will give one time dose of IV lasix.   OSA; CPAP.  Acute on chronic renal failure; stage III;  Relate to hypovolemia.  Prior cr 1.5. --1.7 Cr plateau at 2.1.  He has had multiples episodes of urine retention, will proceed with foley catheter placement.  Monitor renal function on lasix.  Cr worse today.   Hypotension; blood transfusion.  High 90.  bp improved.   Hypoglycemia; poor oral intake. Hypoglycemia last night. Will discontinue lantus.   PVC; on amiodarone.    Pressure injury of skin wound breakdown noted on bridge of patient's nose.  Noted by nursing, present on admission.  Gel barrier placed   Pressure Injury 03/16/19 Unstageable - Full thickness tissue loss in which the base of the ulcer is covered by slough (yellow, tan, gray, green or brown) and/or eschar (tan, brown or black) in the wound bed. small shallow ulceration with yellowish base. (Active)  03/16/19 1230  Location:   Location Orientation:   Staging: Unstageable - Full thickness tissue loss in which the base of the ulcer is covered by slough (yellow, tan, gray, green or brown) and/or eschar (tan, brown or black) in the wound bed.  Wound Description (Comments): small shallow ulceration with  yellowish base.  Present on Admission: Yes     Estimated body mass index is 23.31 kg/m as calculated from the following:   Height as of this encounter: 5\' 9"  (1.753 m).   Weight as of this encounter: 71.6 kg.   DVT prophylaxis: scd Code Status: DNR Family Communication: care discussed with son Disposition Plan:  Remain in the hospital for treatment of Heart failure exacerbation   Consultants:   GI   Procedures:   Endoscopy;    Antimicrobials:none   Subjective: He is more anxious today.  He is complaining of SOB.   Objective: Vitals:   03/20/19 1722 03/20/19 2114 03/21/19 0407 03/21/19 0942  BP: 101/64 102/73 95/69 92/65   Pulse: (!) 53 72 81 86  Resp: 18 18 17 18   Temp: 98.2 F (36.8 C) 98.1 F (36.7 C) (!) 97.5 F (36.4 C) (!) 97.4 F (36.3 C)  TempSrc: Oral Oral Oral Oral  SpO2: 97% 97% 97% 97%  Weight:  71.6 kg    Height:        Intake/Output Summary (Last 24 hours) at 03/21/2019 1107 Last data filed at 03/21/2019 0900 Gross per 24 hour  Intake 540 ml  Output 450 ml  Net 90 ml   Filed Weights   03/17/19 2025 03/19/19 1936 03/20/19 2114  Weight: 80.3 kg 79.2 kg 71.6 kg    Examination:  General exam: Mild anxious Respiratory system: Bilateral crackles.  Cardiovascular system: S 1, S 2 RRR Gastrointestinal system: BS present, soft, nt Central nervous system:Alert, confuse, anxious Extremities: Symmetric power.  Skin: no rashes  Data Reviewed: I have personally reviewed following labs and imaging studies  CBC: Recent Labs  Lab 03/18/19 0623 03/18/19 1728 03/19/19 0553 03/19/19 2005 03/19/19 2228 03/20/19 0856 03/21/19 0756  WBC 6.4 7.0 5.2  --   --  6.2 6.9  HGB 8.1* 7.8* 6.6* 10.0* 10.2* 9.9* 10.1*  HCT 24.5* 23.1* 20.2* 27.9* 28.9* 28.3* 27.9*  MCV 81.7 82.2 81.8  --   --  83.5 83.5  PLT 114* 112* 109*  --   --  117* 277*   Basic Metabolic Panel: Recent Labs  Lab 03/17/19 0805 03/18/19 0623 03/19/19 0410 03/20/19 0856  03/21/19 0756  NA 134* 136 133* 135 134*  K 3.2* 2.7* 3.1* 2.9* 3.4*  CL 99 100 100 101 101  CO2 24 23 22 23 22   GLUCOSE 127* 140* 73 105* 121*  BUN 76* 73* 63* 51* 48*  CREATININE 2.29* 2.24* 2.12* 2.14* 2.33*  CALCIUM 7.9* 8.1* 7.6* 7.9* 8.1*  MG 1.5*  --   --   --   --    GFR: Estimated Creatinine Clearance: 29.9 mL/min (A) (by C-G formula based on SCr of 2.33 mg/dL (H)). Liver Function Tests: No results for input(s): AST, ALT, ALKPHOS, BILITOT, PROT, ALBUMIN in the last 168 hours. No results for input(s): LIPASE, AMYLASE in the last 168 hours. No results for input(s): AMMONIA in the last 168 hours. Coagulation Profile: No results for input(s): INR, PROTIME in the last 168 hours. Cardiac Enzymes: No results for input(s): CKTOTAL, CKMB, CKMBINDEX, TROPONINI in the last 168 hours. BNP (last 3 results) No results for input(s): PROBNP in the last 8760 hours.  HbA1C: No results for input(s): HGBA1C in the last 72 hours. CBG: Recent Labs  Lab 03/20/19 1144 03/20/19 1713 03/20/19 2203 03/21/19 0409 03/21/19 0838  GLUCAP 97 168* 106* 95 148*   Lipid Profile: No results for input(s): CHOL, HDL, LDLCALC, TRIG, CHOLHDL, LDLDIRECT in the last 72 hours. Thyroid Function Tests: No results for input(s): TSH, T4TOTAL, FREET4, T3FREE, THYROIDAB in the last 72 hours. Anemia Panel: No results for input(s): VITAMINB12, FOLATE, FERRITIN, TIBC, IRON, RETICCTPCT in the last 72 hours. Sepsis Labs: No results for input(s): PROCALCITON, LATICACIDVEN in the last 168 hours.  Recent Results (from the past 240 hour(s))  MRSA PCR Screening     Status: None   Collection Time: 03/16/19  3:46 PM  Result Value Ref Range Status   MRSA by PCR NEGATIVE NEGATIVE Final    Comment:        The GeneXpert MRSA Assay (FDA approved for NASAL specimens only), is one component of a comprehensive MRSA colonization surveillance program. It is not intended to diagnose MRSA infection nor to guide or monitor  treatment for MRSA infections. Performed at [facility], [place] [address], [place], [place] [address]          Radiology Studies: Dg Chest Port 1 View  Result Date: 03/21/2019 CLINICAL DATA:  Shortness of Breath EXAM: PORTABLE CHEST 1 VIEW COMPARISON:  02/24/2019 FINDINGS: Cardiomegaly. Layering bilateral effusions with bilateral perihilar and lower lobe airspace opacities, right greater than left. This could reflect asymmetric edema or infection. No acute bony abnormality. IMPRESSION: Cardiomegaly. Layering bilateral effusions and bilateral airspace disease, right greater than left. This could reflect asymmetric edema or infection. Electronically Signed   By: Rolm Baptise M.D.   On: 03/21/2019 10:04        Scheduled Meds: . amiodarone  200 mg Oral Daily  . atorvastatin  80 mg Oral q1800  . carvedilol  3.125 mg Oral BID WC  . furosemide  40 mg Intravenous Once  . insulin aspart  0-15 Units Subcutaneous TID WC  . pantoprazole (PROTONIX) IV  40 mg Intravenous Q12H  . sodium chloride flush  3 mL Intravenous Q12H  . sodium chloride flush  3 mL Intravenous Q12H   Continuous Infusions: . sodium chloride       LOS: 5 days    Time spent: 35 minutes.     Elmarie Shiley, MD [facility] Pager [phone] If 7PM-7AM, please contact night-coverage [email or web address] Password [email or web address] 03/21/2019, 11:07 AM

## 2019-03-21 NOTE — Progress Notes (Signed)
Physical Therapy Treatment Patient Details Name: Aaron Mclean MRN: [id number] DOB: [birth year] Today's Date: 03/21/2019    History of Present Illness Pt is a 68 y/o male admitted secondary to diverticulosis of intestine and anemia. Pt is s/p EGD that revelaed duodenal and gastric ulcers. PMH includes CHF, OSA on CPAP, DM, CKD, HTN, a fib, and dementia.     PT Comments    Patient mobilizing well with therapy today, ambulating 2' with contact guard, min A at most. SpO2 95%.      Follow Up Recommendations  SNF;Supervision/Assistance - 24 hour     Equipment Recommendations  None recommended by PT    Recommendations for Other Services       Precautions / Restrictions Precautions Precautions: Fall;Other (comment) Precaution Comments: hgb Restrictions Weight Bearing Restrictions: No    Mobility  Bed Mobility Overal bed mobility: Needs Assistance Bed Mobility: Supine to Sit;Sit to Supine;Rolling Rolling: Min assist            Transfers Overall transfer level: Needs assistance Equipment used: Rolling walker (2 wheeled) Transfers: Sit to/from [facility] Sit to Stand: Min assist Stand pivot transfers: Min assist       General transfer comment: Pt reports slight dizziness in standing, but resolves within a few seconds.  Ambulation/Gait Ambulation/Gait assistance: Min assist Gait Distance (Feet): 45 Feet Assistive device: Rolling walker (2 wheeled) Gait Pattern/deviations: Step-to pattern;Step-through pattern Gait velocity: decreased   General Gait Details: slow unsteady gait with CGA/Min A at most at times.    Stairs             Wheelchair Mobility    Modified Rankin (Stroke Patients Only)       Balance Overall balance assessment: Needs assistance Sitting-balance support: Bilateral upper extremity supported;Feet supported Sitting balance-[name]: Fair       Standing balance-[name]: Fair Standing balance comment: with  RW for support               High Level Balance Comments: fair with RW            Cognition Arousal/Alertness: Awake/alert Behavior During Therapy: WFL for tasks assessed/performed Overall Cognitive Status: History of cognitive impairments - at baseline                                 General Comments: Pt oriented to person and situation. Did not know where he was or the time.       Exercises      General Comments        Pertinent Vitals/Pain      Home Living                      Prior Function            PT Goals (current goals can now be found in the care plan section) Acute Rehab PT Goals Patient Stated Goal: to feel better PT Goal Formulation: With patient Time For Goal Achievement: 04/01/19 Potential to Achieve Goals: Fair Progress towards PT goals: Progressing toward goals    Frequency    Min 2X/week      PT Plan Current plan remains appropriate    Co-evaluation              AM-PAC PT "6 Clicks" Mobility   Outcome Measure  Help needed turning from your back to your side while in a flat bed without using  bedrails?: A Little Help needed moving from lying on your back to sitting on the side of a flat bed without using bedrails?: A Little Help needed moving to and from a bed to a chair (including a wheelchair)?: A Lot Help needed standing up from a chair using your arms (e.g., wheelchair or bedside chair)?: A Lot Help needed to walk in hospital room?: A Lot Help needed climbing 3-5 steps with a railing? : Total 6 Click Score: 13    End of Session Equipment Utilized During Treatment: Gait belt Activity Tolerance: Patient tolerated treatment well Patient left: in bed;with call bell/phone within reach;with bed alarm set Nurse Communication: Mobility status PT Visit Diagnosis: Unsteadiness on feet (R26.81);Muscle weakness (generalized) (M62.81);Difficulty in walking, not elsewhere classified (R26.2)     Time:  6144-3154 PT Time Calculation (min) (ACUTE ONLY): 20 min  Charges:  $Gait Training: 8-22 mins                     Reinaldo Berber, PT, DPT Acute Rehabilitation Services Pager: [phone] Office: [phone]     Reinaldo Berber 03/21/2019, 9:07 AM

## 2019-03-21 NOTE — Progress Notes (Signed)
Occupational Therapy Treatment Patient Details Name: Aaron Mclean MRN: [id number] DOB: Apr 17, 1951 Today's Date: 03/21/2019    History of present illness Pt is a 68 y/o male admitted secondary to diverticulosis of intestine and anemia. Pt is s/p EGD that revelaed duodenal and gastric ulcers. PMH includes CHF, OSA on CPAP, DM, CKD, HTN, a fib, and dementia.    OT comments  Pt tolerated recliner x1 hour and wanted to return to bed as pt reported dizziness. VSS. Pt minA for transfer and bed mobility for LB guidance. Pt continues to progress with therapy. Pt would greatly benefit from continued OT skilled services for ADL, mobility and safety. OT to follow acutely.    Follow Up Recommendations  SNF;Supervision/Assistance - 24 hour    Equipment Recommendations  Other (comment)(TBD)    Recommendations for Other Services      Precautions / Restrictions Precautions Precautions: Fall;Other (comment) Precaution Comments: hgb Restrictions Weight Bearing Restrictions: No       Mobility Bed Mobility Overal bed mobility: Needs Assistance Bed Mobility: Supine to Sit;Sit to Supine;Rolling Rolling: Min assist     Sit to supine: Mod assist   General bed mobility comments: Mod A for assist with trunk management and LE management.   Transfers Overall transfer level: Needs assistance Equipment used: Rolling walker (2 wheeled) Transfers: Sit to/from [facility] Sit to Stand: Min assist Stand pivot transfers: Min assist       General transfer comment: Pt reports slight dizziness in sitting, but resolves within a few seconds.    Balance Overall balance assessment: Needs assistance   Sitting balance-[name]: Fair       Standing balance-[name]: Fair Standing balance comment: with RW for support               High Level Balance Comments: fair with RW           ADL either performed or assessed with clinical judgement   ADL Overall ADL's : Needs  assistance/impaired                                     Functional mobility during ADLs: Minimal assistance;Rolling walker General ADL Comments: Pt set-upA for feeding and pt increasing strength and has improved with overall ADL.     Vision   Vision Assessment?: No apparent visual deficits   Perception     Praxis      Cognition Arousal/Alertness: Awake/alert Behavior During Therapy: WFL for tasks assessed/performed Overall Cognitive Status: History of cognitive impairments - at baseline                                 General Comments: More oriented today        Exercises     Shoulder Instructions       General Comments Hgb 9.9    Pertinent Vitals/ Pain       Pain Assessment: Faces Faces Pain Scale: Hurts even more Pain Location: back Pain Intervention(s): Limited activity within patient's tolerance;Monitored during session  Home Living                                          Prior Functioning/Environment  Frequency  Min 3X/week        Progress Toward Goals  OT Goals(current goals can now be found in the care plan section)  Progress towards OT goals: Progressing toward goals  Acute Rehab OT Goals Patient Stated Goal: to feel better OT Goal Formulation: With patient Time For Goal Achievement: 04/02/19 Potential to Achieve Goals: Good ADL Goals Pt Will Perform Grooming: with modified independence;sitting Pt Will Perform Upper Body Bathing: with set-up;sitting Pt Will Perform Lower Body Dressing: with supervision;sit to/from stand Pt Will Transfer to Toilet: with supervision;squat pivot transfer;bedside commode Pt Will Perform Toileting - Clothing Manipulation and hygiene: with min assist;sit to/from stand Pt/caregiver will Perform Home Exercise Program: Both right and left upper extremity;With Supervision;With written HEP provided Additional ADL Goal #1: pt will state and implement 3  energy conservation strategies to use during ADL/IADL and mobility  Plan Discharge plan remains appropriate    Co-evaluation                 AM-PAC OT "6 Clicks" Daily Activity     Outcome Measure   Help from another person eating meals?: A Little Help from another person taking care of personal grooming?: A Little Help from another person toileting, which includes using toliet, bedpan, or urinal?: A Lot Help from another person bathing (including washing, rinsing, drying)?: A Lot Help from another person to put on and taking off regular upper body clothing?: A Little Help from another person to put on and taking off regular lower body clothing?: A Lot 6 Click Score: 15    End of Session Equipment Utilized During Treatment: Gait belt;Rolling walker  OT Visit Diagnosis: Unsteadiness on feet (R26.81);Muscle weakness (generalized) (M62.81);Other symptoms and signs involving cognitive function   Activity Tolerance Treatment limited secondary to medical complications (Comment)   Patient Left in bed;with bed alarm set;with call bell/phone within reach   Nurse Communication Mobility status        Time: 3976-7341 OT Time Calculation (min): 11 min  Charges: OT General Charges $OT Visit: 1 Visit OT Treatments $Neuromuscular Re-education: 8-22 mins  Darryl Nestle) Marsa Aris OTR/L Acute Rehabilitation Services Pager: [phone] Office: [phone]    Jenene Slicker Rilea Arutyunyan 03/21/2019, 5:07 PM

## 2019-03-22 ENCOUNTER — Inpatient Hospital Stay (HOSPITAL_COMMUNITY): Payer: PPO

## 2019-03-22 LAB — URINALYSIS, ROUTINE W REFLEX MICROSCOPIC
Bilirubin Urine: NEGATIVE
Glucose, UA: NEGATIVE mg/dL
Ketones, ur: NEGATIVE mg/dL
Nitrite: NEGATIVE
Protein, ur: 30 mg/dL — AB
Specific Gravity, Urine: 1.01 (ref 1.005–1.030)
pH: 6 (ref 5.0–8.0)

## 2019-03-22 LAB — BASIC METABOLIC PANEL
Anion gap: 11 (ref 5–15)
BUN: 48 mg/dL — ABNORMAL HIGH (ref 8–23)
CO2: 24 mmol/L (ref 22–32)
Calcium: 8.5 mg/dL — ABNORMAL LOW (ref 8.9–10.3)
Chloride: 100 mmol/L (ref 98–111)
Creatinine, Ser: 2.54 mg/dL — ABNORMAL HIGH (ref 0.61–1.24)
GFR calc Af Amer: 29 mL/min — ABNORMAL LOW (ref >60)
GFR calc non Af Amer: 25 mL/min — ABNORMAL LOW (ref >60)
Glucose, Bld: 90 mg/dL (ref 70–99)
Potassium: 4.1 mmol/L (ref 3.5–5.1)
Sodium: 135 mmol/L (ref 135–145)

## 2019-03-22 LAB — CBC
HCT: 29.4 % — ABNORMAL LOW (ref 39.0–52.0)
Hemoglobin: 10.6 g/dL — ABNORMAL LOW (ref 13.0–17.0)
MCH: 30.2 pg (ref 26.0–34.0)
MCHC: 36.1 g/dL — ABNORMAL HIGH (ref 30.0–36.0)
MCV: 83.8 fL (ref 80.0–100.0)
Platelets: 181 10*3/uL (ref 150–400)
RBC: 3.51 MIL/uL — ABNORMAL LOW (ref 4.22–5.81)
RDW: 16.2 % — ABNORMAL HIGH (ref 11.5–15.5)
WBC: 8.1 10*3/uL (ref 4.0–10.5)
nRBC: 0 % (ref 0.0–0.2)

## 2019-03-22 LAB — GLUCOSE, CAPILLARY
Glucose-Capillary: 84 mg/dL (ref 70–99)
Glucose-Capillary: 148 mg/dL — ABNORMAL HIGH (ref 70–99)
Glucose-Capillary: 141 mg/dL — ABNORMAL HIGH (ref 70–99)

## 2019-03-22 MED ORDER — PANTOPRAZOLE SODIUM 40 MG PO TBEC
40.0000 mg | DELAYED_RELEASE_TABLET | Freq: Two times a day (BID) | ORAL | Status: DC
  Administered 2019-03-22 – 2019-03-27 (×11): 40 mg via ORAL
  Filled 2019-03-22 (×10): qty 1

## 2019-03-22 MED ORDER — SENNOSIDES-DOCUSATE SODIUM 8.6-50 MG PO TABS
1.0000 | ORAL_TABLET | Freq: Two times a day (BID) | ORAL | Status: DC
  Administered 2019-03-22 – 2019-03-27 (×10): 1 via ORAL
  Filled 2019-03-22 (×10): qty 1

## 2019-03-22 NOTE — Progress Notes (Signed)
PROGRESS NOTE    A[facility]  JAS:[id number] DOB: 04/13/1951 DOA: 03/16/2019 PCP: Aaron Collie, MD    Brief Narrative: Aaron [facility]clean a 68 y.o.malewith medical history significant ofdiabetes type 2 uncontrolled with peripheral vascular disease status post toe amputations, atrial fibrillation, dementia, chronic kidney disease stage III, struct of sleep apnea on CPAP, hypertension, dyslipidemia and congestive heart failure with an ejection fraction of 10 to 15% thought worsened by PVCs. Patient was last discharged from our facility in December 2019 when he was placed on amiodarone in hopes that they could suppress his PVCs and improve his ejection fraction. Follow-up was planned but patient was lost to follow-up when he entered [facility] [place].     Patient was sent from the nursing facility to [facility] on morning of 4/5 area early this morning patient complained of dark stools at the facility Was not anticoagulated.   Previous colonoscopy done over a decade ago demonstrated diverticulosis and internal hemorrhoids.  Hemoglobin at 12.7, was 15.4 at facility.  Transferred to [facility] due to lack of GI coverage at [facility].  Over the next 24 hours, hemoglobin has continued to trend downward and currently at 8.5 with increase in patient's creatinine.  Patient himself is quite somnolent.  Patient taken for endoscopy noting multiple gastric and duodenal ulcers, nonbleeding.    Assessment & Plan:   Principal Problem:   Diverticulosis of intestine with bleeding Active Problems:   Benign prostatic hyperplasia with urinary obstruction   Chronic systolic CHF (congestive heart failure), NYHA class 4 (HCC)   OSA on CPAP   DM (diabetes mellitus), type 2 with peripheral vascular complications (HCC)   CKD (chronic kidney disease) stage 3, GFR 30-59 ml/min (HCC)   Essential hypertension   Dyslipidemia   Pressure injury of skin   Goals of care,  counseling/discussion   Palliative care by specialist   1-GI bleed upper; related to multiples duodenal ulcer with VV bleeding s/p injection cauterization, gastric ulcer erosive gastropathy, esophagitis, barrett esophagus.  -appreciate GI evaluation.  -Treated initially with IV protonix, plan to change to oral today.  -no melena ro bloody stool reported.  Hb increase appropriately to 9.9.  No further bleeding.  Hb stable.   2-Acute blood loss anemia; in setting of GI bleed, see problem one.  S/P 2 units PRBC on 03-19-2019. Hb stable at 10  3-hypokalemia; resolved  4-Dementia; stable.   5-Acute on Chronic systolic Heart failure EF 10--15 % He is complaining of SOB, Chest x ray with asymmetric edema. Received 60 mg IV lasix 4-10. CR up today to 2.5. I have consulted nephrology   OSA; CPAP.  Acute on chronic renal failure; stage III;  Relate to hypovolemia.  Prior cr 1.5. --1.7 Cr plateau at 2.1.  He has had multiples episodes of urine retention, will proceed with foley catheter placement.  Monitor renal function on lasix.  Cr worse today, will consult nephrology   Hypotension; blood transfusion.  High 90.  bp improved.   Hypoglycemia; poor oral intake. Hypoglycemia last night. Will discontinue lantus.   PVC; on amiodarone.    Pressure injury of skin wound breakdown noted on bridge of patient's nose.  Noted by nursing, present on admission.  Gel barrier placed   Pressure Injury 03/16/19 Unstageable - Full thickness tissue loss in which the base of the ulcer is covered by slough (yellow, tan, gray, green or brown) and/or eschar (tan, brown or black) in the wound bed. small shallow ulceration with yellowish  base. (Active)  03/16/19 1230  Location:   Location Orientation:   Staging: Unstageable - Full thickness tissue loss in which the base of the ulcer is covered by slough (yellow, tan, gray, green or brown) and/or eschar (tan, brown or black) in the wound bed.  Wound  Description (Comments): small shallow ulceration with yellowish base.  Present on Admission: Yes     Estimated body mass index is 25.26 kg/m as calculated from the following:   Height as of this encounter: 5\' 9"  (1.753 m).   Weight as of this encounter: 77.6 kg.   DVT prophylaxis: scd Code Status: DNR Family Communication: care discussed with son Disposition Plan:  Remain in the [facility] for treatment of Heart failure exacerbation   Consultants:   GI   Procedures:   Endoscopy;    Antimicrobials:none   Subjective: He is alert, confuse at baseline. Denies pain. He is breathing better today  Objective: Vitals:   03/22/19 0559 03/22/19 0636 03/22/19 0637 03/22/19 0952  BP: 103/79   (!) 112/95  Pulse: (!) 110 92  (!) 58  Resp: 17   17  Temp: (!) 97.4 F (36.3 C)   98.2 F (36.8 C)  TempSrc: Oral   Oral  SpO2: (!) 89% 95% 95% 98%  Weight: 77.6 kg     Height:        Intake/Output Summary (Last 24 hours) at 03/22/2019 1223 Last data filed at 03/22/2019 0559 Gross per 24 hour  Intake 240 ml  Output 1550 ml  Net -1310 ml   Filed Weights   03/20/19 2114 03/21/19 2027 03/22/19 0559  Weight: 71.6 kg 78.9 kg 77.6 kg    Examination:  General exam: NAD Respiratory system: Crackles bases Cardiovascular system: S 1, S 2 RRR Gastrointestinal system: BS present, soft, nt Central nervous system: alert, pleasantly confuse Extremities: symmetric power.  Skin: no rashes  Data Reviewed: I have personally reviewed following labs and imaging studies  CBC: Recent Labs  Lab 03/18/19 1728 03/19/19 0553 03/19/19 2005 03/19/19 2228 03/20/19 0856 03/21/19 0756 03/22/19 0234  WBC 7.0 5.2  --   --  6.2 6.9 8.1  HGB 7.8* 6.6* 10.0* 10.2* 9.9* 10.1* 10.6*  HCT 23.1* 20.2* 27.9* 28.9* 28.3* 27.9* 29.4*  MCV 82.2 81.8  --   --  83.5 83.5 83.8  PLT 112* 109*  --   --  117* 145* 045   Basic Metabolic Panel: Recent Labs  Lab 03/17/19 0805 03/18/19 0623 03/19/19 0410  03/20/19 0856 03/21/19 0756 03/22/19 0234  NA 134* 136 133* 135 134* 135  K 3.2* 2.7* 3.1* 2.9* 3.4* 4.1  CL 99 100 100 101 101 100  CO2 24 23 22 23 22 24   GLUCOSE 127* 140* 73 105* 121* 90  BUN 76* 73* 63* 51* 48* 48*  CREATININE 2.29* 2.24* 2.12* 2.14* 2.33* 2.54*  CALCIUM 7.9* 8.1* 7.6* 7.9* 8.1* 8.5*  MG 1.5*  --   --   --   --   --    GFR: Estimated Creatinine Clearance: 27.4 mL/min (A) (by C-G formula based on SCr of 2.54 mg/dL (H)). Liver Function Tests: No results for input(s): AST, ALT, ALKPHOS, BILITOT, PROT, ALBUMIN in the last 168 hours. No results for input(s): LIPASE, AMYLASE in the last 168 hours. No results for input(s): AMMONIA in the last 168 hours. Coagulation Profile: No results for input(s): INR, PROTIME in the last 168 hours. Cardiac Enzymes: No results for input(s): CKTOTAL, CKMB, CKMBINDEX, TROPONINI in the last 168  hours. BNP (last 3 results) No results for input(s): PROBNP in the last 8760 hours. HbA1C: No results for input(s): HGBA1C in the last 72 hours. CBG: Recent Labs  Lab 03/21/19 1654 03/21/19 2110 03/21/19 2228 03/22/19 0601 03/22/19 1141  GLUCAP 97 91 89 84 148*   Lipid Profile: No results for input(s): CHOL, HDL, LDLCALC, TRIG, CHOLHDL, LDLDIRECT in the last 72 hours. Thyroid Function Tests: No results for input(s): TSH, T4TOTAL, FREET4, T3FREE, THYROIDAB in the last 72 hours. Anemia Panel: No results for input(s): VITAMINB12, FOLATE, FERRITIN, TIBC, IRON, RETICCTPCT in the last 72 hours. Sepsis Labs: No results for input(s): PROCALCITON, LATICACIDVEN in the last 168 hours.  Recent Results (from the past 240 hour(s))  MRSA PCR Screening     Status: None   Collection Time: 03/16/19  3:46 PM  Result Value Ref Range Status   MRSA by PCR NEGATIVE NEGATIVE Final    Comment:        The GeneXpert MRSA Assay (FDA approved for NASAL specimens only), is one component of a comprehensive MRSA colonization surveillance program. It is not  intended to diagnose MRSA infection nor to guide or monitor treatment for MRSA infections. Performed at [facility], [place] [address], [place], [place] [address]          Radiology Studies: Dg Chest Port 1 View  Result Date: 03/21/2019 CLINICAL DATA:  Shortness of Breath EXAM: PORTABLE CHEST 1 VIEW COMPARISON:  02/24/2019 FINDINGS: Cardiomegaly. Layering bilateral effusions with bilateral perihilar and lower lobe airspace opacities, right greater than left. This could reflect asymmetric edema or infection. No acute bony abnormality. IMPRESSION: Cardiomegaly. Layering bilateral effusions and bilateral airspace disease, right greater than left. This could reflect asymmetric edema or infection. Electronically Signed   By: Rolm Baptise M.D.   On: 03/21/2019 10:04        Scheduled Meds: . amiodarone  200 mg Oral Daily  . atorvastatin  80 mg Oral q1800  . carvedilol  3.125 mg Oral BID WC  . insulin aspart  0-15 Units Subcutaneous TID WC  . pantoprazole  40 mg Oral BID  . potassium chloride  20 mEq Oral BID  . sodium chloride flush  3 mL Intravenous Q12H  . sodium chloride flush  3 mL Intravenous Q12H   Continuous Infusions: . sodium chloride       LOS: 6 days    Time spent: 35 minutes.     Elmarie Shiley, MD [facility] Pager [phone] If 7PM-7AM, please contact night-coverage [email or web address] Password [facility] 03/22/2019, 12:23 PM

## 2019-03-22 NOTE — Progress Notes (Signed)
Insurance authorization has been started by [employer]   Domenic Schwab, MSW, [place]

## 2019-03-22 NOTE — Progress Notes (Signed)
CSW called and spoke with Dr. Tyrell Antonio, the patient would be ready for discharge on Sunday or Monday depending on kidney function.   CSW called and left a voicemail with [employer] to start insurance authorization.   CSW is awaiting a phone call back.   Domenic Schwab, MSW, [place]

## 2019-03-22 NOTE — Consult Note (Signed)
[facility]  HISTORY AND PHYSICAL  Aaron [facility] is an 68 y.o. male.    Chief Complaint:  GI bleeding  HPI: Pt is a 18M with a PMH significant for systolic CHF with EF 35-00%, HTN, DM II, Afib (not on AC), and dementia who is now seen in consultation at the request of Dr. Tyrell Antonio for evaluation and recommendations surrounding AKI on CKD.  Baseline Cr is 1.7 in Dec 2019.  He was transferred here from [facility] for evaluation of GI bleeding.  He normally stays at [facility].    He was evaluated by GI and underwent EGD 4/6 which showed multiple bleeding duodenal ulcers along with gastritis and Barrett's esophagus.  Placed on PPI.  hgb nadired at 6.6.  He required 2 u pRBCs.    Cr here has been 2.1-2.2.  He was reporting SOB and CXR 4/10 showed asymmetric edema vs infection and bilateral pleural effusions.  Was given one dose of Lasix 20 IV and Cr now 2.5. Not on O2. No NSAIDs, contrasted scans (at least here).  OP med list has losartan, Lasix, aldactone.  In this setting we are asked to see.  He is unable to provide history.    PMH: Past Medical History:  Diagnosis Date  . A-fib ([place])   . Arthritis   . CKD (chronic kidney disease)   . Constipation   . Diabetes mellitus without complication ([place])   . Dysrhythmia    a-fib  - 3 episodes first of 2018. None since starting Bystolic, (08/13/80 [name] OV notes patient reported home episodes where heart rate felt irregular. Had PACs, PVCs on EKG.)  . Hemorrhoid    internal  . Hypertension   . Neuropathy   . Sleep apnea    can't use cpap   PSH: Past Surgical History:  Procedure Laterality Date  . BIOPSY  03/17/2019   Procedure: BIOPSY;  Surgeon: Rush Landmark Telford Nab., MD;  Location: [place];  Service: Gastroenterology;;  . CARDIOVASCULAR STRESS TEST     [facility]/01/17 [facility]): No ischemia. Fixed defect in the inferior segment most likely from scar from old MI. Marked hypokinetic inferior  segment of LV with EF 37%.   . ESOPHAGOGASTRODUODENOSCOPY (EGD) WITH PROPOFOL N/A 03/17/2019   Procedure: ESOPHAGOGASTRODUODENOSCOPY (EGD) WITH PROPOFOL;  Surgeon: Rush Landmark Telford Nab., MD;  Location: [place];  Service: Gastroenterology;  Laterality: N/A;  . EXOSTECTECTOMY TOE Right 07/02/2017   Procedure: TORSAL EXOSTECTOMY FIFTH METARSAL BASE WOUND DEBRIDMENT AND GRAFT APPLICATION;  Surgeon: [name], DPM;  Location: [place];  Service: Podiatry;  Laterality: Right;  . FOOT TENDON SURGERY Right    spur removed  . GRAFT APPLICATION Right 08/08/9370   Procedure: GRAFT APPLICATION;  Surgeon: [name], DPM;  Location: [place];  Service: Podiatry;  Laterality: Right;  . HEMOSTASIS CLIP PLACEMENT  03/17/2019   Procedure: HEMOSTASIS CLIP PLACEMENT;  Surgeon: Irving Copas., MD;  Location: [place];  Service: Gastroenterology;;  . HOT HEMOSTASIS N/A 03/17/2019   Procedure: HOT HEMOSTASIS (ARGON PLASMA COAGULATION/BICAP);  Surgeon: Irving Copas., MD;  Location: [place];  Service: Gastroenterology;  Laterality: N/A;  . RIGHT/LEFT HEART CATH AND CORONARY ANGIOGRAPHY N/A 12/02/2018   Procedure: RIGHT/LEFT HEART CATH AND CORONARY ANGIOGRAPHY;  Surgeon: Jolaine Artist, MD;  Location: [place] [facility];  Service: Cardiovascular;  Laterality: N/A;  . ROTATOR CUFF REPAIR Right   . SCLEROTHERAPY  03/17/2019   Procedure: Clide Deutscher;  Surgeon: Mansouraty, Telford Nab., MD;  Location: [facility]  ENDOSCOPY;  Service: Gastroenterology;;  . WOUND DEBRIDEMENT Right 07/02/2017   Procedure: DEBRIDEMENT WOUND;  Surgeon: [name], DPM;  Location: [place];  Service: Podiatry;  Laterality: Right;    Past Medical History:  Diagnosis Date  . A-fib ([place])   . Arthritis   . CKD (chronic kidney disease)   . Constipation   . Diabetes mellitus without complication ([place])   . Dysrhythmia    a-fib  - 3 episodes first of 2018. None since starting Bystolic, (02/13/45 [name] OV notes  patient reported home episodes where heart rate felt irregular. Had PACs, PVCs on EKG.)  . Hemorrhoid    internal  . Hypertension   . Neuropathy   . Sleep apnea    can't use cpap    Medications:   Scheduled: . amiodarone  200 mg Oral Daily  . atorvastatin  80 mg Oral q1800  . carvedilol  3.125 mg Oral BID WC  . insulin aspart  0-15 Units Subcutaneous TID WC  . pantoprazole  40 mg Oral BID  . senna-docusate  1 tablet Oral BID  . sodium chloride flush  3 mL Intravenous Q12H  . sodium chloride flush  3 mL Intravenous Q12H    Medications Prior to Admission  Medication Sig Dispense Refill  . acetaminophen (TYLENOL) 325 MG tablet Take 650 mg by mouth every 6 (six) hours as needed (for pain or a fever >100.4 degrees F).     Marland Kitchen amiodarone (PACERONE) 200 MG tablet Take 200 mg by mouth daily.    Marland Kitchen aspirin EC 81 MG tablet Take 1 tablet (81 mg total) by mouth daily. 30 tablet 0  . atorvastatin (LIPITOR) 80 MG tablet Take 1 tablet (80 mg total) by mouth daily. 30 tablet 1  . carvedilol (COREG) 3.125 MG tablet Take 1 tablet (3.125 mg total) by mouth 2 (two) times daily with a meal. 60 tablet 1  . furosemide (LASIX) 40 MG tablet Take 1 tablet (40 mg total) by mouth daily as needed for fluid or edema. (Patient taking differently: Take 40 mg by mouth 2 (two) times daily. ) 30 tablet 1  . insulin glargine (LANTUS) 100 UNIT/ML injection Inject 12 Units into the skin daily.    . ondansetron (ZOFRAN) 4 MG tablet Take 4 mg by mouth every 6 (six) hours as needed for nausea or vomiting.    Marland Kitchen amiodarone (PACERONE) 400 MG tablet Take 1 tablet (400 mg total) by mouth 2 (two) times daily. (Patient not taking: Reported on 03/16/2019) 60 tablet 0  . docusate sodium (COLACE) 100 MG capsule Take 1 capsule (100 mg total) by mouth 2 (two) times daily. (Patient not taking: Reported on 03/16/2019) 10 capsule 0  . Insulin Degludec-Liraglutide (XULTOPHY [place]) Inject into the skin. Samples, pt unable to provide dose.    .  liraglutide (VICTOZA) 18 MG/3ML SOPN Inject 1.2 mg into the skin daily.    Marland Kitchen losartan (COZAAR) 25 MG tablet Take 1 tablet (25 mg total) by mouth daily. (Patient not taking: Reported on 03/16/2019) 30 tablet 0  . pantoprazole (PROTONIX) 40 MG tablet Take 1 tablet (40 mg total) by mouth daily. (Patient not taking: Reported on 03/16/2019) 30 tablet 0  . spironolactone (ALDACTONE) 25 MG tablet Take 0.5 tablets (12.5 mg total) by mouth at bedtime. (Patient not taking: Reported on 03/16/2019) 30 tablet 0    ALLERGIES:   Allergies  Allergen Reactions  . Sertraline Rash    FAM HX: Family History  Problem Relation Age of Onset  .  Alzheimer's disease Mother   . Congestive Heart Failure Mother   . Cancer Father     Social History:   reports that he has quit smoking. His smoking use included cigars. He has never used smokeless tobacco. He reports that he does not drink alcohol or use drugs.  ROS: ROS: not able to obtain d/t dementia  Blood pressure (!) 112/95, pulse (!) 58, temperature 98.2 F (36.8 C), temperature source Oral, resp. rate 17, height 5\' 9"  (1.753 m), weight 77.6 kg, SpO2 98 %. PHYSICAL EXAM: Physical Exam  GEN perseverative on seeing brother and going "down the street" HEENT tacky MM, EOMI PERRL NECK no JVD PULM normal WOB, not on O2, slightly muffled at bases CV irregular ABD thin, soft EXT no LE edema NEURO confused, not redirectable today SKIN no rashes or lesions   Results for orders placed or performed during the hospital encounter of 03/16/19 (from the past 48 hour(s))  Glucose, capillary     Status: Abnormal   Collection Time: 03/20/19  5:13 PM  Result Value Ref Range   Glucose-Capillary 168 (H) 70 - 99 mg/dL  Glucose, capillary     Status: Abnormal   Collection Time: 03/20/19 10:03 PM  Result Value Ref Range   Glucose-Capillary 106 (H) 70 - 99 mg/dL  Glucose, capillary     Status: None   Collection Time: 03/21/19  4:09 AM  Result Value Ref Range    Glucose-Capillary 95 70 - 99 mg/dL  CBC     Status: Abnormal   Collection Time: 03/21/19  7:56 AM  Result Value Ref Range   WBC 6.9 4.0 - 10.5 K/uL   RBC 3.34 (L) 4.22 - 5.81 MIL/uL   Hemoglobin 10.1 (L) 13.0 - 17.0 g/dL   HCT 27.9 (L) 39.0 - 52.0 %   MCV 83.5 80.0 - 100.0 fL   MCH 30.2 26.0 - 34.0 pg   MCHC 36.2 (H) 30.0 - 36.0 g/dL   RDW 16.0 (H) 11.5 - 15.5 %   Platelets 145 (L) 150 - 400 K/uL   nRBC 0.0 0.0 - 0.2 %    Comment: Performed at [facility], [address], [place], [place] [address]  Basic metabolic panel     Status: Abnormal   Collection Time: 03/21/19  7:56 AM  Result Value Ref Range   Sodium 134 (L) 135 - 145 mmol/L   Potassium 3.4 (L) 3.5 - 5.1 mmol/L   Chloride 101 98 - 111 mmol/L   CO2 22 22 - 32 mmol/L   Glucose, Bld 121 (H) 70 - 99 mg/dL   BUN 48 (H) 8 - 23 mg/dL   Creatinine, Ser 2.33 (H) 0.61 - 1.24 mg/dL   Calcium 8.1 (L) 8.9 - 10.3 mg/dL   GFR calc non Af Amer 27 (L) >60 mL/min   GFR calc Af Amer 32 (L) >60 mL/min   Anion gap 11 5 - 15    Comment: Performed at [facility] [address], [place], [place] [address]  Glucose, capillary     Status: Abnormal   Collection Time: 03/21/19  8:38 AM  Result Value Ref Range   Glucose-Capillary 148 (H) 70 - 99 mg/dL  Glucose, capillary     Status: Abnormal   Collection Time: 03/21/19 11:29 AM  Result Value Ref Range   Glucose-Capillary 131 (H) 70 - 99 mg/dL  Sodium, urine, random     Status: None   Collection Time: 03/21/19 12:36 PM  Result Value Ref  Range   Sodium, Ur <10 mmol/L    Comment: Performed at [facility] [address] [place], [place] [address]  Creatinine, urine, random     Status: None   Collection Time: 03/21/19 12:36 PM  Result Value Ref Range   Creatinine, Urine 92.47 mg/dL    Comment: Performed at [facility] [address] [place], [place] [address]  Glucose, capillary     Status: None   Collection Time: 03/21/19  4:54 PM  Result Value Ref Range    Glucose-Capillary 97 70 - 99 mg/dL  Glucose, capillary     Status: None   Collection Time: 03/21/19  9:10 PM  Result Value Ref Range   Glucose-Capillary 91 70 - 99 mg/dL  Glucose, capillary     Status: None   Collection Time: 03/21/19 10:28 PM  Result Value Ref Range   Glucose-Capillary 89 70 - 99 mg/dL  CBC     Status: Abnormal   Collection Time: 03/22/19  2:34 AM  Result Value Ref Range   WBC 8.1 4.0 - 10.5 K/uL   RBC 3.51 (L) 4.22 - 5.81 MIL/uL   Hemoglobin 10.6 (L) 13.0 - 17.0 g/dL   HCT 29.4 (L) 39.0 - 52.0 %   MCV 83.8 80.0 - 100.0 fL   MCH 30.2 26.0 - 34.0 pg   MCHC 36.1 (H) 30.0 - 36.0 g/dL   RDW 16.2 (H) 11.5 - 15.5 %   Platelets 181 150 - 400 K/uL   nRBC 0.0 0.0 - 0.2 %    Comment: Performed at [facility], [place] [address], [place], [place] [address]  Basic metabolic panel     Status: Abnormal   Collection Time: 03/22/19  2:34 AM  Result Value Ref Range   Sodium 135 135 - 145 mmol/L   Potassium 4.1 3.5 - 5.1 mmol/L   Chloride 100 98 - 111 mmol/L   CO2 24 22 - 32 mmol/L   Glucose, Bld 90 70 - 99 mg/dL   BUN 48 (H) 8 - 23 mg/dL   Creatinine, Ser 2.54 (H) 0.61 - 1.24 mg/dL   Calcium 8.5 (L) 8.9 - 10.3 mg/dL   GFR calc non Af Amer 25 (L) >60 mL/min   GFR calc Af Amer 29 (L) >60 mL/min   Anion gap 11 5 - 15    Comment: Performed at [facility] [address] [place], [place] [address]  Glucose, capillary     Status: None   Collection Time: 03/22/19  6:01 AM  Result Value Ref Range   Glucose-Capillary 84 70 - 99 mg/dL  Glucose, capillary     Status: Abnormal   Collection Time: 03/22/19 11:41 AM  Result Value Ref Range   Glucose-Capillary 148 (H) 70 - 99 mg/dL    Dg Chest Port 1 View  Result Date: 03/21/2019 CLINICAL DATA:  Shortness of Breath EXAM: PORTABLE CHEST 1 VIEW COMPARISON:  02/24/2019 FINDINGS: Cardiomegaly. Layering bilateral effusions with bilateral perihilar and lower lobe airspace opacities, right greater than left. This could reflect  asymmetric edema or infection. No acute bony abnormality. IMPRESSION: Cardiomegaly. Layering bilateral effusions and bilateral airspace disease, right greater than left. This could reflect asymmetric edema or infection. Electronically Signed   By: Rolm Baptise M.D.   On: 03/21/2019 10:04    Assessment/Plan  1.  AKI on CKD III: initial insult likely d/t acute blood loss anemia with losartan, lasix, aldactone on board.  Cr 2.2-->2.5 and urinating adequately.  Will do a renal US and UA.  Interestingly, his weights have gone down since he's been here so I'm not sure how much PO he's really taking in.  I would hold lasix for one more day today and then probably restart at 20 mg PO daily tomorrow since with his EF he will likely require some measure of diuresis. He clincially doesn't really appear volume up.    2.   Bilateral pleural effusions: lasix as above  3.  Chronic systolic CHF EF 62-56%: on coreg and amio (h/o PVCs), holding losartan and aldactone  4.  GIB: duodenal ulcers, s/p cauterization by GI  5.  Dispo: likely back to [facility], appreciate palliative care.  Madelon Lips 03/22/2019, 3:05 PM

## 2019-03-22 NOTE — Plan of Care (Signed)
  Problem: Clinical Measurements: Goal: Respiratory complications will improve Outcome: Progressing   Problem: Clinical Measurements: Goal: Cardiovascular complication will be avoided Outcome: Progressing   Problem: Nutrition: Goal: Adequate nutrition will be maintained Outcome: Progressing   Problem: Elimination: Goal: Will not experience complications related to bowel motility Outcome: Progressing   Problem: Safety: Goal: Ability to remain free from injury will improve Outcome: Progressing   

## 2019-03-23 ENCOUNTER — Inpatient Hospital Stay: Payer: Self-pay

## 2019-03-23 DIAGNOSIS — I5043 Acute on chronic combined systolic (congestive) and diastolic (congestive) heart failure: Secondary | ICD-10-CM

## 2019-03-23 DIAGNOSIS — I493 Ventricular premature depolarization: Secondary | ICD-10-CM

## 2019-03-23 LAB — GLUCOSE, CAPILLARY
Glucose-Capillary: 81 mg/dL (ref 70–99)
Glucose-Capillary: 104 mg/dL — ABNORMAL HIGH (ref 70–99)
Glucose-Capillary: 94 mg/dL (ref 70–99)
Glucose-Capillary: 120 mg/dL — ABNORMAL HIGH (ref 70–99)

## 2019-03-23 LAB — BASIC METABOLIC PANEL
Anion gap: 15 (ref 5–15)
BUN: 58 mg/dL — ABNORMAL HIGH (ref 8–23)
CO2: 20 mmol/L — ABNORMAL LOW (ref 22–32)
Calcium: 8.6 mg/dL — ABNORMAL LOW (ref 8.9–10.3)
Chloride: 100 mmol/L (ref 98–111)
Creatinine, Ser: 3.21 mg/dL — ABNORMAL HIGH (ref 0.61–1.24)
GFR calc Af Amer: 22 mL/min — ABNORMAL LOW (ref >60)
GFR calc non Af Amer: 19 mL/min — ABNORMAL LOW (ref >60)
Glucose, Bld: 104 mg/dL — ABNORMAL HIGH (ref 70–99)
Potassium: 4.7 mmol/L (ref 3.5–5.1)
Sodium: 135 mmol/L (ref 135–145)

## 2019-03-23 MED ORDER — SODIUM CHLORIDE 0.9 % IV SOLN
1.0000 g | INTRAVENOUS | Status: DC
  Administered 2019-03-23 – 2019-03-24 (×2): 1 g via INTRAVENOUS
  Filled 2019-03-23 (×3): qty 10

## 2019-03-23 MED ORDER — SODIUM CHLORIDE 0.9% FLUSH
10.0000 mL | Freq: Two times a day (BID) | INTRAVENOUS | Status: DC
  Administered 2019-03-23 – 2019-03-27 (×6): 10 mL

## 2019-03-23 MED ORDER — MILRINONE LACTATE IN DEXTROSE 20-5 MG/100ML-% IV SOLN
0.2500 ug/kg/min | INTRAVENOUS | Status: DC
  Administered 2019-03-23 – 2019-03-25 (×4): 0.25 ug/kg/min via INTRAVENOUS
  Filled 2019-03-23 (×5): qty 100

## 2019-03-23 MED ORDER — SODIUM CHLORIDE 0.9% FLUSH: 10.0000 mL | INTRAVENOUS | Status: DC | PRN

## 2019-03-23 NOTE — Progress Notes (Signed)
Patient transferred to [facility], gave report and answered all the questions.

## 2019-03-23 NOTE — NC FL2 (Signed)
[place] LEVEL OF CARE SCREENING TOOL     IDENTIFICATION  Patient Name: Aaron Mclean Birthdate: 09-Jan-1951 Sex: male Admission Date (Current Location): 03/16/2019  [facility] and [place] Number:  Herbalist and Address:  [facility] [place], [place] [address], [place], [place] [address]      Provider Number: [id number]  Attending Physician Name and Address:  Elmarie Shiley, MD  Relative Name and Phone Number:  Justyce, Baby, [phone] & Callie Fielding, Daughter, [phone]    Current Level of Care: Hospital Recommended Level of Care: [place] Prior Approval Number:    Date Approved/Denied: 02/26/19 PASRR Number: [id number]  Discharge Plan: SNF    Current Diagnoses: Patient Active Problem List   Diagnosis Date Noted  . Goals of care, counseling/discussion   . Palliative care by specialist   . Pressure injury of skin 03/17/2019  . Diverticulosis of intestine with bleeding 03/16/2019  . Chronic systolic CHF (congestive heart failure), NYHA class 4 ([place]) 12/02/2018  . OSA on CPAP 12/02/2018  . DM (diabetes mellitus), type 2 with peripheral vascular complications ([place]) 93/73/4287  . CKD (chronic kidney disease) stage 3, GFR 30-59 ml/min (HCC) 12/02/2018  . Essential hypertension 12/02/2018  . Dyslipidemia 12/02/2018  . Benign prostatic hyperplasia with urinary obstruction 09/20/2015  . Family history of malignant neoplasm of prostate 09/20/2015  . Excessive urination at night 09/20/2015    Orientation RESPIRATION BLADDER Height & Weight     Self, Place  Normal Continent, External catheter Weight: 171 lb 1.2 oz (77.6 kg) Height:  5\' 9"  (175.3 cm)  BEHAVIORAL SYMPTOMS/MOOD NEUROLOGICAL BOWEL NUTRITION STATUS      Incontinent Diet(Heart Healthy, thin liquids)  AMBULATORY STATUS COMMUNICATION OF NEEDS Skin   Supervision   Normal(pressure injury and MASD on buttocks, barrier cream)                        Personal Care Assistance Level of Assistance  Bathing, Feeding, Dressing, Total care Bathing Assistance: Limited assistance Feeding assistance: Independent Dressing Assistance: Limited assistance Total Care Assistance: Limited assistance   Functional Limitations Info  Sight, Hearing, Speech Sight Info: Adequate Hearing Info: Adequate Speech Info: Adequate    SPECIAL CARE FACTORS FREQUENCY  OT (By licensed OT), PT (By licensed PT)     PT Frequency: 5x/wk OT Frequency: 5x/wk            Contractures Contractures Info: Not present    Additional Factors Info  Code Status, Allergies, Insulin Sliding Scale Code Status Info: DNR Allergies Info: Sertraline   Insulin Sliding Scale Info: insulin aspart novolog 0-15 units 3z daily w/meals       Current Medications (03/23/2019):  This is the current hospital active medication list Current Facility-Administered Medications  Medication Dose Route Frequency Provider Last Rate Last Dose  . 0.9 %  sodium chloride infusion  250 mL Intravenous PRN Lady Deutscher, MD      . acetaminophen (TYLENOL) tablet 650 mg  650 mg Oral Q6H PRN Lady Deutscher, MD   650 mg at 03/22/19 2155   Or  . acetaminophen (TYLENOL) suppository 650 mg  650 mg Rectal Q6H PRN Lady Deutscher, MD      . amiodarone (PACERONE) tablet 200 mg  200 mg Oral Daily Lady Deutscher, MD   200 mg at 03/23/19 0954  . atorvastatin (LIPITOR) tablet 80 mg  80 mg Oral q1800 Lady Deutscher, MD  80 mg at 03/22/19 1759  . carvedilol (COREG) tablet 3.125 mg  3.125 mg Oral BID WC Regalado, Belkys A, MD   3.125 mg at 03/23/19 0849  . cefTRIAXone (ROCEPHIN) 1 g in sodium chloride 0.9 % 100 mL IVPB  1 g Intravenous Q24H Madelon Lips, MD      . insulin aspart (novoLOG) injection 0-15 Units  0-15 Units Subcutaneous TID WC Lady Deutscher, MD   1 Units at 03/22/19 1759  . ondansetron (ZOFRAN) tablet 4 mg  4 mg Oral Q6H PRN Lady Deutscher, MD       Or  .  ondansetron [facility]) injection 4 mg  4 mg Intravenous Q6H PRN Lady Deutscher, MD      . pantoprazole (PROTONIX) EC tablet 40 mg  40 mg Oral BID Regalado, Belkys A, MD   40 mg at 03/23/19 0955  . senna-docusate (Senokot-S) tablet 1 tablet  1 tablet Oral BID Regalado, Belkys A, MD   1 tablet at 03/23/19 0955  . sodium chloride flush (NS) 0.9 % injection 3 mL  3 mL Intravenous Q12H Lady Deutscher, MD   3 mL at 03/21/19 1039  . sodium chloride flush (NS) 0.9 % injection 3 mL  3 mL Intravenous Q12H Lady Deutscher, MD   3 mL at 03/22/19 2156  . sodium chloride flush (NS) 0.9 % injection 3 mL  3 mL Intravenous PRN Lady Deutscher, MD      . traMADol Veatrice Bourbon) tablet 50 mg  50 mg Oral Q6H PRN Lady Deutscher, MD   50 mg at 03/22/19 2155     Discharge Medications: Please see discharge summary for a list of discharge medications.  Relevant Imaging Results:  Relevant Lab Results:   Additional Information SSN: [id number]  Philippa Chester Rever Pichette, LCSWA

## 2019-03-23 NOTE — Consult Note (Signed)
Cardiology Consultation:   Patient ID: Aaron Mclean MRN: [id number]; DOB: 1951/10/24  Admit date: 03/16/2019 Date of Consult: 03/23/2019  Primary Care Provider: Marco Collie, MD Primary Cardiologist: Glori Bickers, MD  Primary Electrophysiologist:  None    Patient Profile:   Aaron Mclean is a 68 y.o. male with a hx of chronic systolic heart failure, frequent PVCs, CKD stage III, paroxysmal Afib, DM, HTN, moderate to severe MR, and OSA not on CPAP who is being seen today for the evaluation of acute on chronic sCHF at the request of Dr. Glennon Hamilton.  History of Present Illness:   Aaron Mclean is know to this service during a previous hospitalization 54/6568 for acute systolic heart failure. Echo 03/16/19 with EF of 15%. He did have a right and left heart cath 12/02/18 as part of the workup for new onset systolic heart failure.  He had 80% stenosis in the RCA but felt his heart failure was out of proportion to the CAD.  Felt to be mixed ischemic/nonischemic cardiomyopathy with frequent PVCs contributing to depressed EF.  He was started on amiodarone to suppress PVCs.  Unfortunately he was lost to follow-up when he entered a nursing facility in [place].  He was sent to [facility] on 03/15/2018 for complaint of dark stools.  He was not on anticoagulation.  Due to lack of GI coverage at [facility] he was transferred to [facility].  Endoscopy noted multiple ulcers: Multiple duodenal ulcers, erosive gastropathy, and esophagitis.  Hemoglobin has responded appropriately.  However he is complaining of shortness of breath.  Chest x-ray with asymmetric edema.  He has been treated with IV Lasix 60 mg on 03/21/2019.  Renal function declined and nephrology was consulted.  Lasix has been held in the meantime.  Cardiology was consulted for management of his heart failure.    Past Medical History:  Diagnosis Date   A-fib ([place])    Arthritis    CKD (chronic kidney disease)    Constipation    Diabetes mellitus  without complication (HCC)    Dysrhythmia    a-fib  - 3 episodes first of 2018. None since starting Bystolic, (12/12/73 [name] OV notes patient reported home episodes where heart rate felt irregular. Had PACs, PVCs on EKG.)   Hemorrhoid    internal   Hypertension    Neuropathy    Sleep apnea    can't use cpap    Past Surgical History:  Procedure Laterality Date   BIOPSY  03/17/2019   Procedure: BIOPSY;  Surgeon: Rush Landmark Telford Nab., MD;  Location: [facility];  Service: Gastroenterology;;   CARDIOVASCULAR STRESS TEST     05/01/17 [facility]): No ischemia. Fixed defect in the inferior segment most likely from scar from old MI. Marked hypokinetic inferior segment of LV with EF 37%.    ESOPHAGOGASTRODUODENOSCOPY (EGD) WITH PROPOFOL N/A 03/17/2019   Procedure: ESOPHAGOGASTRODUODENOSCOPY (EGD) WITH PROPOFOL;  Surgeon: Rush Landmark Telford Nab., MD;  Location: [place];  Service: Gastroenterology;  Laterality: N/A;   EXOSTECTECTOMY TOE Right 07/02/2017   Procedure: TORSAL EXOSTECTOMY FIFTH METARSAL BASE WOUND DEBRIDMENT AND GRAFT APPLICATION;  Surgeon: [name], DPM;  Location: [place];  Service: Podiatry;  Laterality: Right;   FOOT TENDON SURGERY Right    spur removed   GRAFT APPLICATION Right 1/70/0174   Procedure: GRAFT APPLICATION;  Surgeon: [name], DPM;  Location: [place];  Service: Podiatry;  Laterality: Right;   HEMOSTASIS CLIP PLACEMENT  03/17/2019   Procedure: HEMOSTASIS CLIP PLACEMENT;  Surgeon: Justice Britain  Brooke Bonito., MD;  Location: [place];  Service: Gastroenterology;;   HOT HEMOSTASIS N/A 03/17/2019   Procedure: HOT HEMOSTASIS (ARGON PLASMA COAGULATION/BICAP);  Surgeon: Irving Copas., MD;  Location: [place];  Service: Gastroenterology;  Laterality: N/A;   RIGHT/LEFT HEART CATH AND CORONARY ANGIOGRAPHY N/A 12/02/2018   Procedure: RIGHT/LEFT HEART CATH AND CORONARY ANGIOGRAPHY;  Surgeon: Jolaine Artist, MD;  Location: [place] CV LAB;  Service: Cardiovascular;  Laterality: N/A;   ROTATOR CUFF REPAIR Right    SCLEROTHERAPY  03/17/2019   Procedure: SCLEROTHERAPY;  Surgeon: Mansouraty, Telford Nab., MD;  Location: [place];  Service: Gastroenterology;;   WOUND DEBRIDEMENT Right 07/02/2017   Procedure: DEBRIDEMENT WOUND;  Surgeon: [name], DPM;  Location: [place];  Service: Podiatry;  Laterality: Right;     Home Medications:  Prior to Admission medications   Medication Sig Start Date End Date Taking? Authorizing Provider  acetaminophen (TYLENOL) 325 MG tablet Take 650 mg by mouth every 6 (six) hours as needed (for pain or a fever >100.4 degrees F).    Yes [provider]  amiodarone (PACERONE) 200 MG tablet Take 200 mg by mouth daily.   Yes [provider]  aspirin EC 81 MG tablet Take 1 tablet (81 mg total) by mouth daily. 12/03/18  Yes Hosie Poisson, MD  atorvastatin (LIPITOR) 80 MG tablet Take 1 tablet (80 mg total) by mouth daily. 12/04/18  Yes Hosie Poisson, MD  carvedilol (COREG) 3.125 MG tablet Take 1 tablet (3.125 mg total) by mouth 2 (two) times daily with a meal. 12/03/18  Yes Hosie Poisson, MD  furosemide (LASIX) 40 MG tablet Take 1 tablet (40 mg total) by mouth daily as needed for fluid or edema. Patient taking differently: Take 40 mg by mouth 2 (two) times daily.  12/03/18 03/16/19 Yes Hosie Poisson, MD  insulin glargine (LANTUS) 100 UNIT/ML injection Inject 12 Units into the skin daily.   Yes [provider]  ondansetron (ZOFRAN) 4 MG tablet Take 4 mg by mouth every 6 (six) hours as needed for nausea or vomiting.   Yes [provider]  amiodarone (PACERONE) 400 MG tablet Take 1 tablet (400 mg total) by mouth 2 (two) times daily. Patient not taking: Reported on 03/16/2019 12/03/18   Hosie Poisson, MD  docusate sodium (COLACE) 100 MG capsule Take 1 capsule (100 mg total) by mouth 2 (two) times daily. Patient not taking: Reported on 03/16/2019 12/03/18   Hosie Poisson, MD  Insulin Degludec-Liraglutide (XULTOPHY [place]) Inject into the skin. Samples, pt unable to provide dose.    [provider]  liraglutide (VICTOZA) 18 MG/3ML SOPN Inject 1.2 mg into the skin daily.    [provider]  losartan (COZAAR) 25 MG tablet Take 1 tablet (25 mg total) by mouth daily. Patient not taking: Reported on 03/16/2019 12/04/18   Hosie Poisson, MD  pantoprazole (PROTONIX) 40 MG tablet Take 1 tablet (40 mg total) by mouth daily. Patient not taking: Reported on 03/16/2019 12/04/18   Hosie Poisson, MD  spironolactone (ALDACTONE) 25 MG tablet Take 0.5 tablets (12.5 mg total) by mouth at bedtime. Patient not taking: Reported on 03/16/2019 12/03/18   Hosie Poisson, MD    Inpatient Medications: Scheduled Meds:  amiodarone  200 mg Oral Daily   atorvastatin  80 mg Oral q1800   carvedilol  3.125 mg Oral BID WC   insulin aspart  0-15 Units Subcutaneous TID WC   pantoprazole  40 mg Oral BID   senna-docusate  1 tablet Oral  BID   sodium chloride flush  3 mL Intravenous Q12H   sodium chloride flush  3 mL Intravenous Q12H   Continuous Infusions:  sodium chloride     cefTRIAXone (ROCEPHIN)  IV 1 g (03/23/19 1320)   PRN Meds: sodium chloride, acetaminophen **OR** acetaminophen, ondansetron **OR** ondansetron (ZOFRAN) IV, sodium chloride flush, traMADol  Allergies:    Allergies  Allergen Reactions   Sertraline Rash    Social History:   Social History   Socioeconomic History   Marital status: Single    Spouse name: Not on file   Number of children: Not on file   Years of education: Not on file   Highest education level: Not on file  Occupational History   Not on file  Social Needs   Financial resource strain: Not on file   Food insecurity:    Worry: Not on file    Inability: Not on file   Transportation needs:    Medical: Not on file    Non-medical: Not on file  Tobacco Use   Smoking status: Former Smoker    Types: Cigars    Smokeless tobacco: Never Used  Substance and Sexual Activity   Alcohol use: No    Alcohol/week: 0.0 standard drinks   Drug use: No   Sexual activity: Not on file  Lifestyle   Physical activity:    Days per week: Not on file    Minutes per session: Not on file   Stress: Not on file  Relationships   Social connections:    Talks on phone: Not on file    Gets together: Not on file    Attends religious service: Not on file    Active member of club or organization: Not on file    Attends meetings of clubs or organizations: Not on file    Relationship status: Not on file   Intimate partner violence:    Fear of current or ex partner: Not on file    Emotionally abused: Not on file    Physically abused: Not on file    Forced sexual activity: Not on file  Other Topics Concern   Not on file  Social History Narrative   Not on file    Family History:    Family History  Problem Relation Age of Onset   Alzheimer's disease Mother    Congestive Heart Failure Mother    Cancer Father      ROS:  Please see the history of present illness.   All other ROS reviewed and negative.     Physical Exam/Data:   Vitals:   03/22/19 1722 03/22/19 2107 03/23/19 0436 03/23/19 0811  BP: 105/78 (!) 154/114 109/87 106/66  Pulse: (!) 107 (!) 54 (!) 58 82  Resp: _0 Temp: (!) 97.5 F (36.4 C) 97.6 F (36.4 C) (!) 97.5 F (36.4 C) 97.8 F (36.6 C)  TempSrc: Oral Oral Oral Axillary  SpO2: 95% 98% 98% 96%  Weight:      Height:        Intake/Output Summary (Last 24 hours) at 03/23/2019 1355 Last data filed at 03/23/2019 1158 Gross per 24 hour  Intake 590 ml  Output 350 ml  Net 240 ml   Last 3 Weights 03/22/2019 03/21/2019 03/20/2019  Weight (lbs) 171 lb 1.2 oz 173 lb 15.1 oz 157 lb 13.6 oz  Weight (kg) 77.6 kg 78.9 kg 71.6 kg     Body mass index is 25.26 kg/m.  General:  Well nourished,  well developed, in no acute distress HEENT: normal Lymph: no adenopathy Neck: no  JVD Endocrine:  No thryomegaly Vascular: No carotid bruits; FA pulses 2+ bilaterally without bruits  Cardiac:  normal S1, S2; RRR; no murmur  Lungs:  Crackles at the bases  Abd: soft, nontender, no hepatomegaly  Ext: no edema Musculoskeletal:  No deformities, BUE and BLE strength normal and equal Skin: warm and dry  Neuro:  CNs 2-12 intact, no focal abnormalities noted Psych:  Normal affect   EKG:  The EKG was personally reviewed and demonstrates:  Sinus rhythm with PVCs Telemetry:  Telemetry was personally reviewed and demonstrates:  Sinus rhythm with pvcs  Relevant CV Studies:  Echo 03/16/19: 1. The left ventricle has a visually estimated ejection fraction of 15%. The cavity size was mildly dilated. Left ventricular diastolic function could not be evaluated secondary to atrial fibrillation. There is incoordinate septal motion. Left  ventricular diffuse hypokinesis.  2. Severe akinesis of the left ventricular, entire inferior wall.  3. The right ventricle has moderately reduced systolic function. The cavity was mildly enlarged. There is no increase in right ventricular wall thickness.  4. Large pleural effusion in the left lateral region.  5. The mitral valve is abnormal. Moderate thickening of the mitral valve leaflet. Mitral valve regurgitation is moderate to severe by color flow Doppler.  6. The tricuspid valve is abnormal. Tricuspid valve regurgitation is moderate.  7. The aortic valve is tricuspid. Mild calcification of the aortic valve. Aortic valve regurgitation is mild by color flow Doppler.  8. The aortic root and ascending aorta are normal in size and structure.  9. The inferior vena cava was normal in size with <50% respiratory variability. 10. Left atrial size was mildly dilated. 11. Right atrial size was mildly dilated. 12. The interatrial septum was not well visualized.  SUMMARY   LVEF 15%, severe global hypokinesis with inferior akinesis, incoordinate septal motion,  left pleural effusion, MAC with MV leaflet calcification and moderate to severe MR, moderate TR, calcified aortic valve with mild AI, IVC is non-dilated, but does not collapse, moderate RV Systolic function is also noted   Right and left heart cath 12/02/18:  Prox RCA to Mid RCA lesion is 80% stenosed.  Mid RCA lesion is 60% stenosed.  Dist RCA lesion is 20% stenosed with 20% stenosed side branch in Post Atrio.  Prox Cx lesion is 30% stenosed.  Mid Cx lesion is 100% stenosed.  Ost 2nd Mrg to 2nd Mrg lesion is 70% stenosed.  Ost LM lesion is 30% stenosed.  Prox LAD lesion is 30% stenosed.  Mid LAD to Dist LAD lesion is 60% stenosed.   Findings:  Ao = 132/68 (89) LV = 131/22 RA = 5 RV = 55/6 PA = 53/19 (34) PCW = 23 (v waves to 36) Fick cardiac output/index = 4.4/2.1 PVR = 2.5 WU Ao sat = 98% PA sat = 68%, 61%  Assessment: 1. 3v CAD  2. 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 Moderately elevated filling pressures with moderately reduced cardiac output  Plan/Discussion:  He has significant CAD but LV dysfunction out of proportion to CAD. Suspect severe PVC-induced CM. Adriana Lina start amiodarone to suppress PVCs and hopefully EF Islay Mclean recover. Once more stable can consider outpatient PCI of RCA. Jaquin Coy treat remainder of CAD medically.   Laboratory Data:  Chemistry Recent Labs  Lab 03/21/19 0756 03/22/19 0234 03/23/19 0656  NA 134* 135 135  K 3.4* 4.1 4.7  CL 101 100 100  CO2 22 24 20*  GLUCOSE 121* 90 104*  BUN 48* 48* 58*  CREATININE 2.33* 2.54* 3.21*  CALCIUM 8.1* 8.5* 8.6*  GFRNONAA 27* 25* 19*  GFRAA 32* 29* 22*  ANIONGAP _0 No results for input(s): PROT, ALBUMIN, AST, ALT, ALKPHOS, BILITOT in the last 168 hours. Hematology Recent Labs  Lab 03/20/19 0856 03/21/19 0756 04/16/19 0234  WBC 6.2 6.9 8.1  RBC 3.39* 3.34* 3.51*  HGB 9.9* 10.1* 10.6*  HCT 28.3* 27.9* 29.4*  MCV 83.5 83.5 83.8  MCH 29.2 30.2 30.2  MCHC 35.0  36.2* 36.1*  RDW 15.4 16.0* 16.2*  PLT 117* 145* 181   Cardiac EnzymesNo results for input(s): TROPONINI in the last 168 hours. No results for input(s): TROPIPOC in the last 168 hours.  BNPNo results for input(s): BNP, PROBNP in the last 168 hours.  DDimer No results for input(s): DDIMER in the last 168 hours.  Radiology/Studies:  US Renal  Result Date: Apr 16, 2019 CLINICAL DATA:  Acute kidney injury. EXAM: RENAL / URINARY TRACT ULTRASOUND COMPLETE COMPARISON:  None. FINDINGS: Right Kidney: Renal measurements: 10.4 x 4.4 x 5.5 cm = volume: 132 mL. No hydronephrosis. Diffusely increased parenchymal echogenicity. Left Kidney: Renal measurements: 9.7 x 4.5 x 5.2 cm = volume: 119 mL. No hydronephrosis. Diffusely increased parenchymal echogenicity. Simple cyst in the lower kidney measuring 1.5 cm. Small amount of perinephric fluid. Bladder: Decompressed by Foley catheter and not well assessed. Right pleural effusion and right upper quadrant ascites incidentally noted. IMPRESSION: 1. Increased bilateral renal echogenicity suggesting chronic medical renal disease. No hydronephrosis. 2. Small left perinephric fluid, nonspecific but can be seen with urinary tract infection. Electronically Signed   By: Keith Rake M.D.   On: 04-16-19 20:24   Dg Chest Port 1 View  Result Date: 03/21/2019 CLINICAL DATA:  Shortness of Breath EXAM: PORTABLE CHEST 1 VIEW COMPARISON:  02/24/2019 FINDINGS: Cardiomegaly. Layering bilateral effusions with bilateral perihilar and lower lobe airspace opacities, right greater than left. This could reflect asymmetric edema or infection. No acute bony abnormality. IMPRESSION: Cardiomegaly. Layering bilateral effusions and bilateral airspace disease, right greater than left. This could reflect asymmetric edema or infection. Electronically Signed   By: Rolm Baptise M.D.   On: 03/21/2019 10:04    Assessment and Plan:   1. Acute on chronic systolic heart failure, mixed ICM/NICM with  frequent PVCs contributing to depressed EF, CAD felt out of proportion to heart failure - EF 15% - 11/2018 discharged on coreg, losartan (entresto held for marginal pressures), and spironolactone - coreg 3.125 mg BID - lasix, losartan and spiro held given decline in renal function   2. Frequent PVCs  - previously started on amiodarone 11/2018 to suppress PVCs - telemetry with frequent PVCs, possibly due to HF exacerbation   3. CAD with 80% stenosis in the RCA - treated medically in the setting of acute decompensated CHF - pt denies chest pain  - home ASA 81 mg - on hold for bleed - continue statin   4. Acute on chronic renal failure stage III - sCr 3.21, baseline  - K 4.7 - per primary   5. Moderate to severe MR, moderate TR         For questions or updates, please contact [place] Please consult [email or web address] for contact info under     Signed, Ledora Bottcher, PA  03/23/2019 1:55 PM   I have seen and examined this patient with Fabian Sharp.  Agree with above, note  added to reflect my findings.  On exam, iRRR, no murmurs, bibasilar crackles.  Patient admitted to the hospital with a GI bleed, found to have nonbleeding gastric ulcers.  Since that time, it was noted that his creatinine was rising.  He had an echocardiogram that showed an ejection fraction of 15 to 20% which is down from 25% previously.  He does have a nonischemic cardiomyopathy.  His creatinine is unfortunately up to 3.2.  It does certainly appear that he is having an exacerbation of low output heart failure.  Due to that, we Yaret Hush start him on milrinone.  We Chidi Shirer get a COOX in the morning.  We Angell Pincock put him in to have a PICC line placed.  We Adhvik Canady plan to move him 2c see for further cardiac management.  Dalylah Ramey M. Delbert Darley MD 03/23/2019 2:12 PM

## 2019-03-23 NOTE — Progress Notes (Addendum)
PROGRESS NOTE    DASH CARDARELLI  QMV:[id number] DOB: 07/19/1951 DOA: 03/16/2019 PCP: Marco Collie, MD    Brief Narrative: [name] a 68 y.o.malewith medical history significant ofdiabetes type 2 uncontrolled with peripheral vascular disease status post toe amputations, atrial fibrillation, dementia, chronic kidney disease stage III, struct of sleep apnea on CPAP, hypertension, dyslipidemia and congestive heart failure with an ejection fraction of 10 to 15% thought worsened by PVCs. Patient was last discharged from our facility in December 2019 when he was placed on amiodarone in hopes that they could suppress his PVCs and improve his ejection fraction. Follow-up was planned but patient was lost to follow-up when [facility] [place].     Patient was sent from the nursing facility to [facility] on morning of 4/5 area early this morning patient complained of dark stools at the facility Was not anticoagulated.   Previous colonoscopy done over a decade ago demonstrated diverticulosis and internal hemorrhoids.  Hemoglobin at 12.7, was 15.4 at facility.  Transferred to [facility] due to lack of GI coverage at [facility]  Over the next 24 hours, hemoglobin has continued to trend downward and currently at 8.5 with increase in patient's creatinine.  Patient himself is quite somnolent.  Patient taken for endoscopy noting multiple gastric and duodenal ulcers, nonbleeding.    Assessment & Plan:   Principal Problem:   Diverticulosis of intestine with bleeding Active Problems:   Benign prostatic hyperplasia with urinary obstruction   Chronic systolic CHF (congestive heart failure), NYHA class 4 (HCC)   OSA on CPAP   DM (diabetes mellitus), type 2 with peripheral vascular complications (HCC)   CKD (chronic kidney disease) stage 3, GFR 30-59 ml/min (HCC)   Essential hypertension   Dyslipidemia   Pressure injury of skin   Goals of care,  counseling/discussion   Palliative care by specialist   1-GI bleed upper; related to multiples duodenal ulcer with VV bleeding s/p injection cauterization, gastric ulcer erosive gastropathy, esophagitis, barrett esophagus.  -appreciate GI evaluation.  -Treated initially with IV protonix, plan to change to oral today.  -no melena ro bloody stool reported.  Hb increase appropriately to 9.9.  No further bleeding.  Hb stable.   2-Acute blood loss anemia; in setting of GI bleed, see problem one.  S/P 2 units PRBC on 03-19-2019. Hb stable at 10  3-hypokalemia; resolved  4-Dementia; stable.   5-Acute on Chronic systolic Heart failure EF 10--15 % He is complaining of SOB, Chest x ray with asymmetric edema. Received 60 mg IV lasix 4-10. Cr increase  to 2.5 on 4-11, nephrology was consulted and recommend [place] and hold lasix.  Nephrology is recommending HF team consultation. Cardiology consulted.  Cardiology will see patient in consultation today.   OSA; CPAP.  Acute on chronic renal failure; stage III;  Relate to hypovolemia.  Prior cr 1.5. --1.7\ Initially increase cr was related to hypoperfusion from anemia.  He has had multiples episodes of urine retention, will proceed with foley catheter placement.  Cr got worse with lasix 60 mg time one dose.  Nephrology consulted, [place] with perinephric fluid,  evidence of infection. Started ceftriaxone.  Follow urine culture.   Hypotension; blood transfusion.  High 90.  bp improved.   Hypoglycemia; poor oral intake. Hypoglycemia last night. Lantus was stopped.   PVC; on amiodarone.    Pressure injury of skin wound breakdown noted on bridge of patient's nose.  Noted by nursing, present on admission.  Gel barrier  placed   Pressure Injury 03/16/19 Unstageable - Full thickness tissue loss in which the base of the ulcer is covered by slough (yellow, tan, gray, green or brown) and/or eschar (tan, brown or black) in the wound bed. small shallow  ulceration with yellowish base. (Active)  03/16/19 1230  Location:   Location Orientation:   Staging: Unstageable - Full thickness tissue loss in which the base of the ulcer is covered by slough (yellow, tan, gray, green or brown) and/or eschar (tan, brown or black) in the wound bed.  Wound Description (Comments): small shallow ulceration with yellowish base.  Present on Admission: Yes     Estimated body mass index is 25.26 kg/m as calculated from the following:   Height as of this encounter: 5\' 9"  (1.753 m).   Weight as of this encounter: 77.6 kg.   DVT prophylaxis: scd Code Status: DNR Family Communication: care discussed with son. I updated son today regarding worsening renal function and concern about HF. He would be ok with medication to help his father heart " inotrope" but also he wouldn't want his father to be in the hospital the last  days of his life. Cardiology will see in consultation and palliative as well.   Disposition Plan:  Remain in the hospital for treatment of Heart failure, renal failure.    Consultants:   GI   Procedures:   Endoscopy;    Antimicrobials:none   Subjective: He was up all night. He couldn't sleep. He is sleepy this am. He denies dyspnea. He does not have appetite.   Objective: Vitals:   03/22/19 1722 03/22/19 2107 03/23/19 0436 03/23/19 0811  BP: 105/78 (!) 154/114 109/87 106/66  Pulse: (!) 107 (!) 54 (!) 58 82  Resp: 17 20  15   Temp: (!) 97.5 F (36.4 C) 97.6 F (36.4 C) (!) 97.5 F (36.4 C) 97.8 F (36.6 C)  TempSrc: Oral Oral Oral Axillary  SpO2: 95% 98% 98% 96%  Weight:      Height:        Intake/Output Summary (Last 24 hours) at 03/23/2019 1157 Last data filed at 03/23/2019 0817 Gross per 24 hour  Intake 770 ml  Output 175 ml  Net 595 ml   Filed Weights   03/20/19 2114 03/21/19 2027 03/22/19 0559  Weight: 71.6 kg 78.9 kg 77.6 kg    Examination:  General exam: NAD Respiratory system: Crackles bases.   Cardiovascular system: S 1, S 2 RRR Gastrointestinal system: BS present, soft, nt Central nervous system: sleepy today.  Extremities: Symmetric power.  Skin: no rashes.   Data Reviewed: I have personally reviewed following labs and imaging studies  CBC: Recent Labs  Lab 03/18/19 1728 03/19/19 0553 03/19/19 2005 03/19/19 2228 03/20/19 0856 03/21/19 0756 03/22/19 0234  WBC 7.0 5.2  --   --  6.2 6.9 8.1  HGB 7.8* 6.6* 10.0* 10.2* 9.9* 10.1* 10.6*  HCT 23.1* 20.2* 27.9* 28.9* 28.3* 27.9* 29.4*  MCV 82.2 81.8  --   --  83.5 83.5 83.8  PLT 112* 109*  --   --  117* 145* 240   Basic Metabolic Panel: Recent Labs  Lab 03/17/19 0805  03/19/19 0410 03/20/19 0856 03/21/19 0756 03/22/19 0234 03/23/19 0656  NA 134*   < > 133* 135 134* 135 135  K 3.2*   < > 3.1* 2.9* 3.4* 4.1 4.7  CL 99   < > 100 101 101 100 100  CO2 24   < > 22 23 22 24  20*  GLUCOSE 127*   < > 73 105* 121* 90 104*  BUN 76*   < > 63* 51* 48* 48* 58*  CREATININE 2.29*   < > 2.12* 2.14* 2.33* 2.54* 3.21*  CALCIUM 7.9*   < > 7.6* 7.9* 8.1* 8.5* 8.6*  MG 1.5*  --   --   --   --   --   --    < > = values in this interval not displayed.   GFR: Estimated Creatinine Clearance: 21.7 mL/min (A) (by C-G formula based on SCr of 3.21 mg/dL (H)). Liver Function Tests: No results for input(s): AST, ALT, ALKPHOS, BILITOT, PROT, ALBUMIN in the last 168 hours. No results for input(s): LIPASE, AMYLASE in the last 168 hours. No results for input(s): AMMONIA in the last 168 hours. Coagulation Profile: No results for input(s): INR, PROTIME in the last 168 hours. Cardiac Enzymes: No results for input(s): CKTOTAL, CKMB, CKMBINDEX, TROPONINI in the last 168 hours. BNP (last 3 results) No results for input(s): PROBNP in the last 8760 hours. HbA1C: No results for input(s): HGBA1C in the last 72 hours. CBG: Recent Labs  Lab 03/21/19 2228 03/22/19 0601 03/22/19 1141 03/22/19 1724 03/23/19 0728  GLUCAP 89 84 148* 141* 81    Lipid Profile: No results for input(s): CHOL, HDL, LDLCALC, TRIG, CHOLHDL, LDLDIRECT in the last 72 hours. Thyroid Function Tests: No results for input(s): TSH, T4TOTAL, FREET4, T3FREE, THYROIDAB in the last 72 hours. Anemia Panel: No results for input(s): VITAMINB12, FOLATE, FERRITIN, TIBC, IRON, RETICCTPCT in the last 72 hours. Sepsis Labs: No results for input(s): PROCALCITON, LATICACIDVEN in the last 168 hours.  Recent Results (from the past 240 hour(s))  MRSA PCR Screening     Status: None   Collection Time: 03/16/19  3:46 PM  Result Value Ref Range Status   MRSA by PCR NEGATIVE NEGATIVE Final    Comment:        The GeneXpert MRSA Assay (FDA approved for NASAL specimens only), is one component of a comprehensive MRSA colonization surveillance program. It is not intended to diagnose MRSA infection nor to guide or monitor treatment for MRSA infections. Performed at [facility], [place] [address] [place], [place] [address]          Radiology Studies: US Renal  Result Date: 03/22/2019 CLINICAL DATA:  Acute kidney injury. EXAM: RENAL / URINARY TRACT ULTRASOUND COMPLETE COMPARISON:  None. FINDINGS: Right Kidney: Renal measurements: 10.4 x 4.4 x 5.5 cm = volume: 132 mL. No hydronephrosis. Diffusely increased parenchymal echogenicity. Left Kidney: Renal measurements: 9.7 x 4.5 x 5.2 cm = volume: 119 mL. No hydronephrosis. Diffusely increased parenchymal echogenicity. Simple cyst in the lower kidney measuring 1.5 cm. Small amount of perinephric fluid. Bladder: Decompressed by Foley catheter and not well assessed. Right pleural effusion and right upper quadrant ascites incidentally noted. IMPRESSION: 1. Increased bilateral renal echogenicity suggesting chronic medical renal disease. No hydronephrosis. 2. Small left perinephric fluid, nonspecific but can be seen with urinary tract infection. Electronically Signed   By: Keith Rake M.D.   On: 03/22/2019 20:24         Scheduled Meds: . amiodarone  200 mg Oral Daily  . atorvastatin  80 mg Oral q1800  . carvedilol  3.125 mg Oral BID WC  . insulin aspart  0-15 Units Subcutaneous TID WC  . pantoprazole  40 mg Oral BID  . senna-docusate  1 tablet Oral BID  . sodium chloride flush  3 mL Intravenous Q12H  .  sodium chloride flush  3 mL Intravenous Q12H   Continuous Infusions: . sodium chloride    . cefTRIAXone (ROCEPHIN)  IV       LOS: 7 days    Time spent: 35 minutes.     Elmarie Shiley, MD [facility] Pager [phone] If 7PM-7AM, please contact night-coverage [email or web address] Password [facility] 03/23/2019, 11:57 AM

## 2019-03-23 NOTE — Progress Notes (Signed)
CSW received insurance authorization from [facility]. He was approved for skilled nursing, authorization number is [id number]. Please call Benjamine Mola with [employer] at [phone] if you have any additional questions.   Domenic Schwab, MSW, [place]

## 2019-03-23 NOTE — Progress Notes (Addendum)
[facility] Progress Note    Assessment/ Plan:   1.  AKI on CKD III: initial insult likely d/t acute blood loss anemia with losartan, lasix, aldactone on board.  Cr 2.2-->2.5 but up to 3.2 today.  Blood pressures are a little low.  Weights have gone down since he's been here so I'm not sure how much PO he's really taking in.  Renal US without hydro, L perinephric fluid- started on ceftriaxone.  UA with hematuria (previously had Foley), culture pending. Clinically he doesn't really appear really volume up but he has a little more JVD than he did yesterday.  Given lower blood pressures, AKI, and severely depressed EF, I'm concerned that he may be tipping over into a low-output state.  It may be helpful to get AHF on board to see if there is anything we can do to improve cardiac function.    2.   Bilateral pleural effusions: ultimately will need more diuresis- takes Lasix 40 mg daily at home.  3.  Chronic systolic CHF EF 81-85%: on coreg and amio (h/o PVCs), holding losartan and aldactone.    4.  GIB: duodenal ulcers, s/p cauterization by GI  5.  Dispo: appreciate palliative care.  Subjective:    Cr up to 3.2, renal US performed with some echogenic kidneys and L perinephric fluid.  UA with hematuria, culture pending.   Objective:   BP 106/66 (BP Location: Right Arm)   Pulse 82   Temp 97.8 F (36.6 C) (Axillary)   Resp 15   Ht 5\' 9"  (1.753 m)   Wt 77.6 kg   SpO2 96%   BMI 25.26 kg/m   Intake/Output Summary (Last 24 hours) at 03/23/2019 1120 Last data filed at 03/23/2019 0817 Gross per 24 hour  Intake 770 ml  Output 175 ml  Net 595 ml   Weight change:   Physical Exam: GEN sleeping, arousable HEENT MMM, EOMI PERRL NECK 3-4 cm JVD PULM normal WOB, not on O2, slightly muffled at bases CV irregular ABD thin, soft EXT no LE edema NEURO awake and alert SKIN no rashes or lesions  Imaging: US Renal  Result Date: 03/22/2019 CLINICAL DATA:  Acute kidney  injury. EXAM: RENAL / URINARY TRACT ULTRASOUND COMPLETE COMPARISON:  None. FINDINGS: Right Kidney: Renal measurements: 10.4 x 4.4 x 5.5 cm = volume: 132 mL. No hydronephrosis. Diffusely increased parenchymal echogenicity. Left Kidney: Renal measurements: 9.7 x 4.5 x 5.2 cm = volume: 119 mL. No hydronephrosis. Diffusely increased parenchymal echogenicity. Simple cyst in the lower kidney measuring 1.5 cm. Small amount of perinephric fluid. Bladder: Decompressed by Foley catheter and not well assessed. Right pleural effusion and right upper quadrant ascites incidentally noted. IMPRESSION: 1. Increased bilateral renal echogenicity suggesting chronic medical renal disease. No hydronephrosis. 2. Small left perinephric fluid, nonspecific but can be seen with urinary tract infection. Electronically Signed   By: Keith Rake M.D.   On: 03/22/2019 20:24    Labs: BMET Recent Labs  Lab 03/17/19 0805 03/18/19 6314 03/19/19 0410 03/20/19 0856 03/21/19 0756 03/22/19 0234 03/23/19 0656  NA 134* 136 133* 135 134* 135 135  K 3.2* 2.7* 3.1* 2.9* 3.4* 4.1 4.7  CL 99 100 100 101 101 100 100  CO2 24 23 22 23 22 24  20*  GLUCOSE 127* 140* 73 105* 121* 90 104*  BUN 76* 73* 63* 51* 48* 48* 58*  CREATININE 2.29* 2.24* 2.12* 2.14* 2.33* 2.54* 3.21*  CALCIUM 7.9* 8.1* 7.6* 7.9* 8.1* 8.5* 8.6*   CBC  Recent Labs  Lab 03/19/19 0553  03/19/19 2228 03/20/19 0856 03/21/19 0756 03/22/19 0234  WBC 5.2  --   --  6.2 6.9 8.1  HGB 6.6*   < > 10.2* 9.9* 10.1* 10.6*  HCT 20.2*   < > 28.9* 28.3* 27.9* 29.4*  MCV 81.8  --   --  83.5 83.5 83.8  PLT 109*  --   --  117* 145* 181   < > = values in this interval not displayed.    Medications:    . amiodarone  200 mg Oral Daily  . atorvastatin  80 mg Oral q1800  . carvedilol  3.125 mg Oral BID WC  . insulin aspart  0-15 Units Subcutaneous TID WC  . pantoprazole  40 mg Oral BID  . senna-docusate  1 tablet Oral BID  . sodium chloride flush  3 mL Intravenous Q12H  .  sodium chloride flush  3 mL Intravenous Q12H      Madelon Lips MD [facility] pgr [phone] 03/23/2019, 11:20 AM

## 2019-03-23 NOTE — Progress Notes (Signed)
Peripherally Inserted Central Catheter/Midline Placement  The IV Nurse has discussed with the patient and/or persons authorized to consent for the patient, the purpose of this procedure and the potential benefits and risks involved with this procedure.  The benefits include less needle sticks, lab draws from the catheter, and the patient may be discharged home with the catheter. Risks include, but not limited to, infection, bleeding, blood clot (thrombus formation), and puncture of an artery; nerve damage and irregular heartbeat and possibility to perform a PICC exchange if needed/ordered by physician.  Alternatives to this procedure were also discussed.  Bard Power PICC patient education guide, fact sheet on infection prevention and patient information card has been provided to patient /or left at bedside.  telephone consent obtained.  PICC/Midline Placement Documentation  PICC Double Lumen 03/23/19 PICC Right Brachial 39 cm 0 cm (Active)  Indication for Insertion or Continuance of Line Prolonged intravenous therapies 03/23/2019  6:00 PM  Exposed Catheter (cm) 0 cm 03/23/2019  6:00 PM  Site Assessment Clean;Dry;Intact 03/23/2019  6:00 PM  Lumen #1 Status Flushed;Blood return noted;Saline locked 03/23/2019  6:00 PM  Lumen #2 Status Flushed;Blood return noted;Saline locked 03/23/2019  6:00 PM  Dressing Type Transparent 03/23/2019  6:00 PM  Dressing Status Clean;Dry;Intact;Antimicrobial disc in place 03/23/2019  6:00 PM  Line Care Connections checked and tightened 03/23/2019  6:00 PM  Line Adjustment (NICU/IV Team Only) No 03/23/2019  6:00 PM  Dressing Intervention New dressing 03/23/2019  6:00 PM  Dressing Change Due 03/30/19 03/23/2019  6:00 PM       Lorenza Cambridge 03/23/2019, 6:19 PM

## 2019-03-24 DIAGNOSIS — N179 Acute kidney failure, unspecified: Secondary | ICD-10-CM

## 2019-03-24 DIAGNOSIS — D649 Anemia, unspecified: Secondary | ICD-10-CM

## 2019-03-24 DIAGNOSIS — N183 Chronic kidney disease, stage 3 (moderate): Secondary | ICD-10-CM

## 2019-03-24 DIAGNOSIS — I5023 Acute on chronic systolic (congestive) heart failure: Secondary | ICD-10-CM

## 2019-03-24 LAB — COOXEMETRY PANEL
Carboxyhemoglobin: 1.4 % (ref 0.5–1.5)
Methemoglobin: 1.7 % — ABNORMAL HIGH (ref 0.0–1.5)
O2 Saturation: 66.9 %
Total hemoglobin: 10.1 g/dL — ABNORMAL LOW (ref 12.0–16.0)

## 2019-03-24 LAB — BASIC METABOLIC PANEL
Anion gap: 13 (ref 5–15)
BUN: 62 mg/dL — ABNORMAL HIGH (ref 8–23)
CO2: 21 mmol/L — ABNORMAL LOW (ref 22–32)
Calcium: 8 mg/dL — ABNORMAL LOW (ref 8.9–10.3)
Chloride: 102 mmol/L (ref 98–111)
Creatinine, Ser: 3.22 mg/dL — ABNORMAL HIGH (ref 0.61–1.24)
GFR calc Af Amer: 22 mL/min — ABNORMAL LOW (ref >60)
GFR calc non Af Amer: 19 mL/min — ABNORMAL LOW (ref >60)
Glucose, Bld: 134 mg/dL — ABNORMAL HIGH (ref 70–99)
Potassium: 4.2 mmol/L (ref 3.5–5.1)
Sodium: 136 mmol/L (ref 135–145)

## 2019-03-24 LAB — GLUCOSE, CAPILLARY
Glucose-Capillary: 88 mg/dL (ref 70–99)
Glucose-Capillary: 100 mg/dL — ABNORMAL HIGH (ref 70–99)
Glucose-Capillary: 149 mg/dL — ABNORMAL HIGH (ref 70–99)
Glucose-Capillary: 107 mg/dL — ABNORMAL HIGH (ref 70–99)

## 2019-03-24 MED ORDER — AMIODARONE HCL 200 MG PO TABS
400.0000 mg | ORAL_TABLET | Freq: Two times a day (BID) | ORAL | Status: DC
  Administered 2019-03-24 – 2019-03-26 (×4): 400 mg via ORAL
  Filled 2019-03-24 (×4): qty 2

## 2019-03-24 MED ORDER — VANCOMYCIN HCL 10 G IV SOLR
1250.0000 mg | INTRAVENOUS | Status: DC
  Administered 2019-03-24: 1250 mg via INTRAVENOUS
  Filled 2019-03-24 (×2): qty 1250

## 2019-03-24 MED ORDER — FUROSEMIDE 10 MG/ML IJ SOLN
80.0000 mg | Freq: Once | INTRAMUSCULAR | Status: AC
  Administered 2019-03-24: 80 mg via INTRAVENOUS
  Filled 2019-03-24: qty 8

## 2019-03-24 NOTE — Progress Notes (Signed)
Pharmacy Antibiotic Note  Aaron Mclean is a 68 y.o. male admitted on 03/16/2019 with dark stools and worsening HFrEF.  Pharmacy has been consulted for vancomycin dosing for Enterococcus UTI. He has known CKD but SCr is above baseline at 3.22.   Vancomycin 1250 mg IV q48h. Goal AUC 400-550. Expected AUC: 508.6 SCr used: 3.22  Plan: Vancomycin 1250 mg IV q48h Monitor renal function, clinical progress, C/S Narrow if sensitive to PCN  Height: 5\' 9"  (175.3 cm) Weight: 168 lb 3.4 oz (76.3 kg) IBW/kg (Calculated) : 70.7  Temp (24hrs), Avg:97.7 F (36.5 C), Min:97.4 F (36.3 C), Max:98.1 F (36.7 C)  Recent Labs  Lab 03/18/19 1728  03/19/19 0553 03/20/19 0856 03/21/19 0756 03/22/19 0234 03/23/19 0656 03/24/19 0923  WBC 7.0  --  5.2 6.2 6.9 8.1  --   --   CREATININE  --    < >  --  2.14* 2.33* 2.54* 3.21* 3.22*   < > = values in this interval not displayed.    Estimated Creatinine Clearance: 21.7 mL/min (A) (by C-G formula based on SCr of 3.22 mg/dL (H)).    Allergies  Allergen Reactions  . Sertraline Rash    Antimicrobials this admission: CTX 4/12 >> 4/13 Vanc 4/13 >>   Dose adjustments this admission:   Microbiology results: 4/12 UCx: Enterococcus faecalis  4/5 MRSA PCR: neg  Thank you for allowing pharmacy to be a part of this patient's care.  Renold Genta, PharmD, BCPS Clinical Pharmacist Clinical phone for 03/24/2019 until 10p is [phone] 03/24/2019 3:29 PM  **Pharmacist phone directory can now be found on [email or web address] listed under [place]**

## 2019-03-24 NOTE — Progress Notes (Signed)
Sleeping mos of the time, claimed " I'm tired". Refused to eat lunch. Took 240 ml of orange juice. Continue to monitor.

## 2019-03-24 NOTE — Care Management Important Message (Signed)
Important Message  Patient Details  Name: Aaron Mclean MRN: [id number] Date of Birth: 1950-12-25   Medicare Important Message Given:  Yes    [name] 03/24/2019, 3:58 PM

## 2019-03-24 NOTE — Consult Note (Addendum)
Advanced Heart Failure Team Consult Note   Primary Physician: Marco Collie, MD PCP-Cardiologist:  Glori Bickers, MD  Reason for Consultation: Heart Fialure   HPI:    Aaron Mclean is seen today for evaluation of heart failure at the request of Dr Curt Bears.   Aaron Mclean is a 68 year old with a history of DM2, neuropathy, HTN, CKD 3,OSA,PVCs, CAD, and hyperlipidemia. Was diagnosed with acute systolic HF with EF 62-26 and mod-severe Aaron diagnosed in 11/2018.    He underwent LHC in 12/19 with moderate to severe 3v CAD but EF decrease felt to be out of proportion to heart failure. Thought to have mixed icm/nicm. While hospitalized he was noted to have frequent PVCs. He was placed on amiodarone to suppress PVCs. He was started HF medications with plans for close outpatient follow up . He did not show up for his appointment in January 2020.   He was admitted to [facility] earlier this year. 1-2 weeks prior to admit he developed abdominal discomfort so he started taking goody powder and ibuprofen.   Admitted from [facility] due to GI bleed with maroon stools. He was transferred to GI work up. Started on PPI. GI consulted and he underwent EGD. EGD showed esophagitis, suggestive of short segment Barrett's esophagus - not biopsies today as needs to be on acid suppression medication optimally first, gastric ulcer, and multiple duodenal ulcers with one  Injected. He has received a total of 2UPRBCs.   ECHO completed on 03/16/19 and this showed severely reduced EF ~15%, moderately reduced RV, and large left pleural effusion. He developed increased dyspnea. CXR was concerning for edema. He was started on IV lasix. Due to worsening creatinine (2.3>2.5>3.2), nephrology was consulted.  Placed on milrinone for possible low output HF. Renal US was negative for hydronephrosis. Lasix, spiro, and losartan held. Weight has gone down since admit from 179-->168 pounds.   Earlier today he was seen by Dr Posey Pronto and  lasix was restarted. Creatinine remains 3.2.  Now on milrinone 0.25. Co-ox 67% CVP 1-2. Denies SOB, CP, orthopnea or PND>  Feels very weak and tired.   Palliative Care consulted for [place]. He elected DNR.    R/LHC 12/02/18:  Prox RCA to Mid RCA lesion is 80% stenosed.  Mid RCA lesion is 60% stenosed.  Dist RCA lesion is 20% stenosed with 20% stenosed side branch in Post Atrio.  Prox Cx lesion is 30% stenosed.  Mid Cx lesion is 100% stenosed.  Ost 2nd Mrg to 2nd Mrg lesion is 70% stenosed.  Ost LM lesion is 30% stenosed.  Prox LAD lesion is 30% stenosed.  Mid LAD to Dist LAD lesion is 60% stenosed. Findings: Ao = 132/68 (89) LV = 131/22 RA = 5 RV = 55/6 PA = 53/19 (34) PCW = 23 (v waves to 36) Fick cardiac output/index = 4.4/2.1 PVR = 2.5 WU Ao sat = 98% PA sat = 68%, 61% Assessment: 1. 3v CAD  2. 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 Moderately elevated filling pressures with moderately reduced cardiac output Plan/Discussion: He has significant CAD but LV dysfunction out of proportion to CAD. Suspect severe PVC-induced CM. Will start amiodarone to suppress PVCs and hopefully EF will recover. Once more stable can consider outpatient PCI of RCA. Will treat remainder of CAD medically.   Review of Systems: [y] = yes, [ ]  = no   . General: Weight gain [ ] ; Weight loss [ y]; Anorexia Blue.Reese ]; Fatigue [  Y; Fever [ ] ; Chills [ ] ; Weakness [ ]   . Cardiac: Chest pain/pressure [ ] ; Resting SOB [ ] ; Exertional SOB [Y ]; Orthopnea [ ] ; Pedal Edema [ ] ; Palpitations [ ] ; Syncope [ ] ; Presyncope [ ] ; Paroxysmal nocturnal dyspnea[ ]   . Pulmonary: Cough [ ] ; Wheezing[ ] ; Hemoptysis[ ] ; Sputum [ ] ; Snoring [ ]   . GI: Vomiting[ ] ; Dysphagia[ ] ; Melena[ ] ; Hematochezia [ ] ; Heartburn[ ] ; Abdominal pain [ ] ; Constipation [ ] ; Diarrhea [ ] ; BRBPR [ ]   . GU: Hematuria[ ] ; Dysuria [ ] ; Nocturia[ ]   . Vascular: Pain in legs with walking [ ] ; Pain in feet with lying flat [ ] ;  Non-healing sores [ ] ; Stroke [ ] ; TIA [ ] ; Slurred speech [ ] ;  . Neuro: Headaches[ ] ; Vertigo[ ] ; Seizures[ ] ; Paresthesias[ ] ;Blurred vision [ ] ; Diplopia [ ] ; Vision changes [ ]   . Ortho/Skin: Arthritis Blue.Reese ]; Joint pain Blue.Reese ]; Muscle pain [ ] ; Joint swelling [ ] ; Back Pain [Y ]; Rash [ ]   . Psych: Depression[y ]; Anxiety[ ]   . Heme: Bleeding problems [ ] ; Clotting disorders [ ] ; Anemia [Y ]  . Endocrine: Diabetes [ Y]; Thyroid dysfunction[ ]   Home Medications Prior to Admission medications   Medication Sig Start Date End Date Taking? Authorizing Provider  acetaminophen (TYLENOL) 325 MG tablet Take 650 mg by mouth every 6 (six) hours as needed (for pain or a fever >100.4 degrees F).    Yes [provider]  amiodarone (PACERONE) 200 MG tablet Take 200 mg by mouth daily.   Yes [provider]  aspirin EC 81 MG tablet Take 1 tablet (81 mg total) by mouth daily. 12/03/18  Yes Hosie Poisson, MD  atorvastatin (LIPITOR) 80 MG tablet Take 1 tablet (80 mg total) by mouth daily. 12/04/18  Yes Hosie Poisson, MD  carvedilol (COREG) 3.125 MG tablet Take 1 tablet (3.125 mg total) by mouth 2 (two) times daily with a meal. 12/03/18  Yes Hosie Poisson, MD  furosemide (LASIX) 40 MG tablet Take 1 tablet (40 mg total) by mouth daily as needed for fluid or edema. Patient taking differently: Take 40 mg by mouth 2 (two) times daily.  12/03/18 03/16/19 Yes Hosie Poisson, MD  insulin glargine (LANTUS) 100 UNIT/ML injection Inject 12 Units into the skin daily.   Yes [provider]  ondansetron (ZOFRAN) 4 MG tablet Take 4 mg by mouth every 6 (six) hours as needed for nausea or vomiting.   Yes [provider]  amiodarone (PACERONE) 400 MG tablet Take 1 tablet (400 mg total) by mouth 2 (two) times daily. Patient not taking: Reported on 03/16/2019 12/03/18   Hosie Poisson, MD  docusate sodium (COLACE) 100 MG capsule Take 1 capsule (100 mg total) by mouth 2 (two) times daily. Patient not  taking: Reported on 03/16/2019 12/03/18   Hosie Poisson, MD  Insulin Degludec-Liraglutide (XULTOPHY ) Inject into the skin. Samples, pt unable to provide dose.    [provider]  liraglutide (VICTOZA) 18 MG/3ML SOPN Inject 1.2 mg into the skin daily.    [provider]  losartan (COZAAR) 25 MG tablet Take 1 tablet (25 mg total) by mouth daily. Patient not taking: Reported on 03/16/2019 12/04/18   Hosie Poisson, MD  pantoprazole (PROTONIX) 40 MG tablet Take 1 tablet (40 mg total) by mouth daily. Patient not taking: Reported on 03/16/2019 12/04/18   Hosie Poisson, MD  spironolactone (ALDACTONE) 25 MG tablet Take 0.5 tablets (12.5 mg total) by  mouth at bedtime. Patient not taking: Reported on 03/16/2019 12/03/18   Hosie Poisson, MD    Past Medical History: Past Medical History:  Diagnosis Date  . A-fib ([place])   . Arthritis   . CKD (chronic kidney disease)   . Constipation   . Diabetes mellitus without complication ([place])   . Dysrhythmia    a-fib  - 3 episodes first of 2018. None since starting Bystolic, (08/19/91 [name] notes patient reported home episodes where heart rate felt irregular. Had PACs, PVCs on EKG.)  . Hemorrhoid    internal  . Hypertension   . Neuropathy   . Sleep apnea    can't use cpap    Past Surgical History: Past Surgical History:  Procedure Laterality Date  . BIOPSY  03/17/2019   Procedure: BIOPSY;  Surgeon: Rush Landmark Telford Nab., MD;  Location: [place];  Service: Gastroenterology;;  . CARDIOVASCULAR STRESS TEST     05/01/17 [place]): No ischemia. Fixed defect in the inferior segment most likely from scar from old MI. Marked hypokinetic inferior segment of LV with EF 37%.   . ESOPHAGOGASTRODUODENOSCOPY (EGD) WITH PROPOFOL N/A 03/17/2019   Procedure: ESOPHAGOGASTRODUODENOSCOPY (EGD) WITH PROPOFOL;  Surgeon: Rush Landmark Telford Nab., MD;  Location: [place];  Service: Gastroenterology;  Laterality: N/A;  . EXOSTECTECTOMY TOE Right  07/02/2017   Procedure: TORSAL EXOSTECTOMY FIFTH METARSAL BASE WOUND DEBRIDMENT AND GRAFT APPLICATION;  Surgeon: [name], DPM;  Location: [place];  Service: Podiatry;  Laterality: Right;  . FOOT TENDON SURGERY Right    spur removed  . GRAFT APPLICATION Right 12/29/4172   Procedure: GRAFT APPLICATION;  Surgeon: [name], DPM;  Location: [place];  Service: Podiatry;  Laterality: Right;  . HEMOSTASIS CLIP PLACEMENT  03/17/2019   Procedure: HEMOSTASIS CLIP PLACEMENT;  Surgeon: Irving Copas., MD;  Location: [place];  Service: Gastroenterology;;  . HOT HEMOSTASIS N/A 03/17/2019   Procedure: HOT HEMOSTASIS (ARGON PLASMA COAGULATION/BICAP);  Surgeon: Irving Copas., MD;  Location: [place];  Service: Gastroenterology;  Laterality: N/A;  . RIGHT/LEFT HEART CATH AND CORONARY ANGIOGRAPHY N/A 12/02/2018   Procedure: RIGHT/LEFT HEART CATH AND CORONARY ANGIOGRAPHY;  Surgeon: Jolaine Artist, MD;  Location: [place] [facility];  Service: Cardiovascular;  Laterality: N/A;  . ROTATOR CUFF REPAIR Right   . SCLEROTHERAPY  03/17/2019   Procedure: Clide Deutscher;  Surgeon: Mansouraty, Telford Nab., MD;  Location: [place];  Service: Gastroenterology;;  . WOUND DEBRIDEMENT Right 07/02/2017   Procedure: DEBRIDEMENT WOUND;  Surgeon: [name], DPM;  Location: [place];  Service: Podiatry;  Laterality: Right;    Family History: Family History  Problem Relation Age of Onset  . Alzheimer's disease Mother   . Congestive Heart Failure Mother   . Cancer Father     Social History: Social History   Socioeconomic History  . Marital status: Single    Spouse name: Not on file  . Number of children: Not on file  . Years of education: Not on file  . Highest education level: Not on file  Occupational History  . Not on file  Social Needs  . Financial resource strain: Not on file  . Food insecurity:    Worry: Not on file    Inability: Not on file  . Transportation needs:     Medical: Not on file    Non-medical: Not on file  Tobacco Use  . Smoking status: Former Smoker    Types: Cigars  . Smokeless tobacco: Never Used  Substance and Sexual Activity  .  Alcohol use: No    Alcohol/week: 0.0 standard drinks  . Drug use: No  . Sexual activity: Not on file  Lifestyle  . Physical activity:    Days per week: Not on file    Minutes per session: Not on file  . Stress: Not on file  Relationships  . Social connections:    Talks on phone: Not on file    Gets together: Not on file    Attends religious service: Not on file    Active member of club or organization: Not on file    Attends meetings of clubs or organizations: Not on file    Relationship status: Not on file  Other Topics Concern  . Not on file  Social History Narrative  . Not on file    Allergies:  Allergies  Allergen Reactions  . Sertraline Rash    Objective:    Vital Signs:   Temp:  [97.4 F (36.3 C)-98.1 F (36.7 C)] 97.4 F (36.3 C) (04/13 0315) Pulse Rate:  [28-109] 103 (04/13 0933) Resp:  [0-22] 12 (04/13 0800) BP: (94-115)/(49-81) 105/71 (04/13 0933) SpO2:  [81 %-100 %] 99 % (04/13 0800) Weight:  [76.3 kg] 76.3 kg (04/13 0315) Last BM Date: 03/22/19  Weight change: Filed Weights   03/21/19 2027 03/22/19 0559 03/24/19 0315  Weight: 78.9 kg 77.6 kg 76.3 kg    Intake/Output:   Intake/Output Summary (Last 24 hours) at 03/24/2019 1112 Last data filed at 03/24/2019 0800 Gross per 24 hour  Intake 304.4 ml  Output 700 ml  Net -395.6 ml      Physical Exam    General:  Pale weak appearing. Will answer questions but won't open his eyes.  Lying flat in bed. No resp difficulty HEENT: normal Neck: supple. JVP flat . Carotids 2+ bilat; no bruits. No lymphadenopathy or thyromegaly appreciated. Cor: PMI nondisplaced. Irregular rate & rhythm. No rubs, gallops or murmurs. Lungs: clear Abdomen: soft, nontender, nondistended. No hepatosplenomegaly. No bruits or masses. Good bowel  sounds. Extremities: no cyanosis, clubbing, rash, edema Neuro: alert & orientedx3, cranial nerves grossly intact. moves all 4 extremities w/o difficulty. Affect pleasant   Telemetry   Appears to be AF 95-105 very frequent PVCs. Personally reviewed   EKG    Sinus Tach PVCs 102 bpm   Labs   Basic Metabolic Panel: Recent Labs  Lab 03/19/19 0410 03/20/19 0856 03/21/19 0756 03/22/19 0234 03/23/19 0656  NA 133* 135 134* 135 135  K 3.1* 2.9* 3.4* 4.1 4.7  CL 100 101 101 100 100  CO2 22 23 22 24  20*  GLUCOSE 73 105* 121* 90 104*  BUN 63* 51* 48* 48* 58*  CREATININE 2.12* 2.14* 2.33* 2.54* 3.21*  CALCIUM 7.6* 7.9* 8.1* 8.5* 8.6*    Liver Function Tests: No results for input(s): AST, ALT, ALKPHOS, BILITOT, PROT, ALBUMIN in the last 168 hours. No results for input(s): LIPASE, AMYLASE in the last 168 hours. No results for input(s): AMMONIA in the last 168 hours.  CBC: Recent Labs  Lab 03/18/19 1728 03/19/19 0553 03/19/19 2005 03/19/19 2228 03/20/19 0856 03/21/19 0756 03/22/19 0234  WBC 7.0 5.2  --   --  6.2 6.9 8.1  HGB 7.8* 6.6* 10.0* 10.2* 9.9* 10.1* 10.6*  HCT 23.1* 20.2* 27.9* 28.9* 28.3* 27.9* 29.4*  MCV 82.2 81.8  --   --  83.5 83.5 83.8  PLT 112* 109*  --   --  117* 145* 181    Cardiac Enzymes: No results for input(s): CKTOTAL,  CKMB, CKMBINDEX, TROPONINI in the last 168 hours.  BNP: BNP (last 3 results) No results for input(s): BNP in the last 8760 hours.  ProBNP (last 3 results) No results for input(s): PROBNP in the last 8760 hours.   CBG: Recent Labs  Lab 03/23/19 0728 03/23/19 1156 03/23/19 1641 03/23/19 2114 03/24/19 0608  GLUCAP 81 104* 94 120* 88    Coagulation Studies: No results for input(s): LABPROT, INR in the last 72 hours.   Imaging   [place] Ekg Site Rite  Result Date: 03/23/2019 If Site Rite image not attached, placement could not be confirmed due to current cardiac rhythm.     Medications:     Current Medications: .  amiodarone  200 mg Oral Daily  . atorvastatin  80 mg Oral q1800  . carvedilol  3.125 mg Oral BID WC  . insulin aspart  0-15 Units Subcutaneous TID WC  . pantoprazole  40 mg Oral BID  . senna-docusate  1 tablet Oral BID  . sodium chloride flush  10-40 mL Intracatheter Q12H     Infusions: . sodium chloride    . cefTRIAXone (ROCEPHIN)  IV Stopped (03/23/19 1321)  . milrinone 0.25 mcg/kg/min (03/24/19 1000)       Patient Profile    Aaron Mclean is a 68 year old with a history of DM2, neuropathy, HTN, CKD 3,OSA,PVCs, CAD, and hyperlipidemia. He had newly diagnosed mod to severe Aaron and acute systolic HF with EF 84-13 diagnosed in 11/2018.    Admitted from [facility] with GI bleed.   Assessment/Plan   1. Symptomatic anemia due to acute Upper GI Bleed - GI consulted. Received total of 2UPRBCs.  - S/P EGD on 4/6 with gastritis and duodenal ulcers. 1 with APC.  - Hgb Stable.  - On PPI.   2. A/C Systolic Heart Failure, mixed ICM/NICM   - ECHO on 03/16/19 EF 15%.  - Now on milrinone empirically. Co-ox 67% on milrinone 0.25.  - Will continue milrinone for now until renal function starts to improve. Then wean.  - CVP 1-2  Hold diuretics. Discussed with Dr. Posey Pronto - Stop b-blocker - No dig, losartan, or spiro due to elevated creatinine.  - Not candidate for home inotropes of advanced therapies   3. AKI on CKD Stage III -Creatinine trending up from 2.2--> 3.2 (now plateauing). Suspect ATN from hypotension in setting of GIB. +/- cardio renal syndrome. Continue milrinone for now.  - CVP 1-2. Hold diureitcs - Renal US was negative for hydronephrosis.  - Nephrology consulted.   4. PVCs - Has extremely high burden. May be contributing to cardiomyopathy  - Increase amio to 400 bid - Keep K> 4.0 Mg >2.0  5. ? PAF on monitor today - this would be new - check ECG. - on iv amio for PVCs.  - No AC with acute GI bleed   6. CAD  - LHC 2019 with : Prox RCA to Mid RCA lesion is 80%  stenosed. Mid RCA lesion is 60% stenosed. Dist RCA lesion is 20% stenosed with 20% stenosed side branch in Post Atrio. Prox Cx lesion is 30% stenosed. Mid Cx lesion is 100% stenosed. Ost 2nd Mrg to 2nd Mrg lesion is 70% stenosed. Ost LM lesion is 30% stenosed. Prox LAD lesion is 30% stenosed. Mid LAD to Dist LAD lesion is 60% stenosed.  - No evidence ACS - On high dose statin. No asa with GI bleed.   7. DNR - He is very debilitated Palliative Care following.  He is not a candidate for aggressive HF management. Will continue milrinone until renal function recovers and then will stop and not restart.   Plans to return to [facility]   Medication concerns reviewed with patient and pharmacy team. Barriers identified: none   Length of Stay: 8  Glori Bickers, MD  12:54 PM   Advanced Heart Failure Team Pager [phone] (M-F; 7a - 4p)  Please contact [facility] for night-coverage after hours (4p -7a ) and weekends on [email or web address]

## 2019-03-24 NOTE — Progress Notes (Signed)
Occupational Therapy Treatment Patient Details Name: Aaron Mclean MRN: [id number] DOB: [birth year] Today's Date: 03/24/2019    History of present illness Pt is a 68 y/o male admitted secondary to diverticulosis of intestine and anemia. Pt is s/p EGD that revelaed duodenal and gastric ulcers. PMH includes CHF, OSA on CPAP, DM, CKD, HTN, a fib, and dementia.    OT comments  Pt agreeable to EOB grooming this session requiring mod A (total prep) and fatiguing requiring assist for seated balance. Pt educated on importance for OOB activity and benefits to upright posture. Pt seemed excited about doing therapy to "[employer] OT will continue to follow acutely. Current POC remains appropriate.   Follow Up Recommendations  SNF;Supervision/Assistance - 24 hour    Equipment Recommendations  Other (comment)(defer to next venue of care)    Recommendations for Other Services      Precautions / Restrictions Precautions Precautions: Fall       Mobility Bed Mobility Overal bed mobility: Needs Assistance Bed Mobility: Supine to Sit;Sit to Supine     Supine to sit: Max assist;HOB elevated Sit to supine: Max assist   General bed mobility comments: Max A for trunk elevation, cues to use bed rail to assist, min guard assist for balance sitting EOB; assist for BLE back into bed, and assist for re-positioning  Transfers                 General transfer comment: NT this session    Balance Overall balance assessment: Needs assistance Sitting-balance support: Feet supported Sitting balance-[name]: Fair Sitting balance - Comments: posterior leaning with fatigue.                                    ADL either performed or assessed with clinical judgement   ADL Overall ADL's : Needs assistance/impaired     Grooming: Wash/dry hands;Wash/dry face;Oral care;Moderate assistance;Sitting Grooming Details (indicate cue type and reason): EOB, declined OOB.                                General ADL Comments: Educated and encouraged Pt that he needs to keep moving or he will get weaker and weaker, also that he needs to eat for energy.      Vision       Perception     Praxis      Cognition Arousal/Alertness: Lethargic Behavior During Therapy: Flat affect Overall Cognitive Status: History of cognitive impairments - at baseline                                 General Comments: Pt stated "I have no energy because I am not hungry and did not sleep well last night"        Exercises     Shoulder Instructions       General Comments      Pertinent Vitals/ Pain       Pain Assessment: Faces Faces Pain Scale: No hurt Pain Intervention(s): Monitored during session;Limited activity within patient's tolerance  Home Living                                          Prior  Functioning/Environment              Frequency  Min 3X/week        Progress Toward Goals  OT Goals(current goals can now be found in the care plan section)  Progress towards OT goals: Not progressing toward goals - comment(fatigue)  Acute Rehab OT Goals Patient Stated Goal: to feel better OT Goal Formulation: With patient Time For Goal Achievement: 04/02/19 Potential to Achieve Goals: [place] Discharge plan remains appropriate    Co-evaluation                 AM-PAC OT "6 Clicks" Daily Activity     Outcome Measure   Help from another person eating meals?: A Lot Help from another person taking care of personal grooming?: A Lot Help from another person toileting, which includes using toliet, bedpan, or urinal?: A Lot Help from another person bathing (including washing, rinsing, drying)?: A Lot Help from another person to put on and taking off regular upper body clothing?: A Lot Help from another person to put on and taking off regular lower body clothing?: A Lot 6 Click Score: 12    End of Session  Equipment Utilized During Treatment: Oxygen(2L)  OT Visit Diagnosis: Unsteadiness on feet (R26.81);Muscle weakness (generalized) (M62.81);Other symptoms and signs involving cognitive function   Activity Tolerance Patient limited by fatigue   Patient Left in bed;with bed alarm set;with call bell/phone within reach   Nurse Communication Mobility status        Time: 1635-1700 OT Time Calculation (min): 25 min  Charges: OT General Charges $OT Visit: 1 Visit OT Treatments $Self Care/Home Management : 23-37 mins  Aaron Mclean OTR/L Acute Rehabilitation Services Pager: [phone] Office: [place] 03/24/2019, 5:33 PM

## 2019-03-24 NOTE — Progress Notes (Addendum)
Physical Therapy Treatment Patient Details Name: Aaron Mclean MRN: [id number] DOB: May 26, 1951 Today's Date: 03/24/2019    History of Present Illness Pt is a 68 y/o male admitted secondary to diverticulosis of intestine and anemia. Pt is s/p EGD that revelaed duodenal and gastric ulcers. PMH includes CHF, OSA on CPAP, DM, CKD, HTN, a fib, and dementia.     PT Comments    Pt with complaints of fatigue and need to urinate this session, even though pt reminded multiple times he had a catheter in place. Pt also complaining of burning around catheter and bed appeared wet, RN notified. Pt unable to come to standing or ambulate this session, limited by LE weakness and decreased effort to stand with PT. PT continuing to recommend SNF placement to address deficits. PT to continue to follow acutely.    Follow Up Recommendations  SNF;Supervision/Assistance - 24 hour     Equipment Recommendations  None recommended by PT    Recommendations for Other Services       Precautions / Restrictions Precautions Precautions: Fall Restrictions Weight Bearing Restrictions: No    Mobility  Bed Mobility Overal bed mobility: Needs Assistance Bed Mobility: Supine to Sit;Sit to Supine     Supine to sit: Mod assist;+2 for safety/equipment Sit to supine: Mod assist;+2 for safety/equipment   General bed mobility comments: Mod assist for supine<>sit for trunk elevation, LE management. Pt with posterior leaning with sitting EOB ~5 minutes, required max verbal encouragement to stay sitting and progress to attempted standing. Pt able to assist in sliding self up in bed with use of LEs.    Transfers Overall transfer level: Needs assistance Equipment used: 2 person hand held assist Transfers: Sit to/from Stand Sit to Stand: Mod assist;+2 physical assistance;+2 safety/equipment         General transfer comment: Attempted sit to stand x3, pt unable to power up with +2 assist. PT stating he was too weak, and  he wanted to lay back down.  After 3 attempts, PT assisted pt in return to supine.   Ambulation/Gait Ambulation/Gait assistance: (NT - pt unable to stand this session )               Stairs             Wheelchair Mobility    Modified Rankin (Stroke Patients Only)       Balance Overall balance assessment: Needs assistance Sitting-balance support: Feet supported Sitting balance-[name]: Fair Sitting balance - Comments: posterior leaning with fatigue.  Postural control: Posterior lean   Standing balance-[name]: (NT - pt unable to stand this session)                              Cognition Arousal/Alertness: Lethargic Behavior During Therapy: WFL for tasks assessed/performed Overall Cognitive Status: History of cognitive impairments - at baseline                                 General Comments: Pt drowsy upon arrival to room, pt reporting he didn't sleep well last night. Pt perseverating on need to urinate, burning. Pt oriented to having a catheter multiple times during session, but pt continuing to insist "I need to pee, let me pee".       Exercises      General Comments General comments (skin integrity, edema, etc.): SpO2 93% on RA this session,  HR 100-122 bpm.      Pertinent Vitals/Pain Pain Assessment: Faces Faces Pain Scale: Hurts little more Pain Location: perineal area, due to catheter Pain Descriptors / Indicators: Constant;Burning Pain Intervention(s): Limited activity within patient's tolerance;Monitored during session    Home Living                      Prior Function            PT Goals (current goals can now be found in the care plan section) Acute Rehab PT Goals Patient Stated Goal: to feel better PT Goal Formulation: With patient Time For Goal Achievement: 04/01/19 Potential to Achieve Goals: Fair Progress towards PT goals: Not progressing toward goals - comment(Pt limited by fatigue this  session)    Frequency    Min 2X/week      PT Plan Current plan remains appropriate    Co-evaluation              AM-PAC PT "6 Clicks" Mobility   Outcome Measure  Help needed turning from your back to your side while in a flat bed without using bedrails?: A Lot Help needed moving from lying on your back to sitting on the side of a flat bed without using bedrails?: A Lot Help needed moving to and from a bed to a chair (including a wheelchair)?: A Lot Help needed standing up from a chair using your arms (e.g., wheelchair or bedside chair)?: A Lot Help needed to walk in hospital room?: A Lot Help needed climbing 3-5 steps with a railing? : Total 6 Click Score: 11    End of Session Equipment Utilized During Treatment: Gait belt Activity Tolerance: Patient limited by fatigue;Patient limited by pain Patient left: in bed;with call bell/phone within reach;with bed alarm set Nurse Communication: Mobility status PT Visit Diagnosis: Unsteadiness on feet (R26.81);Muscle weakness (generalized) (M62.81);Difficulty in walking, not elsewhere classified (R26.2)     Time: 1001-1020 PT Time Calculation (min) (ACUTE ONLY): 19 min  Charges:  $Therapeutic Activity: 8-22 mins                     Aaron Mclean Aaron Mclean, PT Acute Rehabilitation Services Pager [phone]  Office [phone]    Aaron Mclean Aaron Mclean 03/24/2019, 10:38 AM

## 2019-03-24 NOTE — Progress Notes (Signed)
Patient ID: Aaron Mclean, male   DOB: 02-15-1951, 68 y.o.   MRN: [id number] [facility] Progress Note   Assessment/ Plan:   1. Acute kidney Injury: Suspected to be initially hemodynamically mediated with acute blood loss anemia in the setting of ongoing ARB/Lasix/Aldactone use and likely additional injury from CHF exacerbation.  Nonoliguric overnight and labs pending this morning.  Will attempt to augment urine output with furosemide bolus this morning-on physical exam, still with few rales.  Exceedingly poor candidate for chronic hemodialysis due to severe congestive heart failure (functional status unknown at this time).  Labs pending from this morning 2.  Acute exacerbation of congestive heart failure: Systolic dysfunction with EF 10 to 15%.  Overnight started on milrinone for inotropic support and will re-dose with furosemide this morning. 3.  Acute blood loss anemia secondary to GI bleed from duodenal ulcers.  Status post cauterization by gastroenterology with seemingly stable H/H-labs pending from this morning. 4.  Anion gap metabolic acidosis: Secondary to acute kidney injury/CHF exacerbation, monitor with renal recovery. 5.  Atrial fibrillation: Intermittent bradycardia with normal ventricular response at this time.  Subjective:   Without acute events overnight after transfer to stepdown CICU.   Objective:   BP (!) 103/49   Pulse (!) 31   Temp (!) 97.4 F (36.3 C) (Oral)   Resp 16   Ht 5\' 9"  (1.753 m)   Wt 76.3 kg   SpO2 99%   BMI 24.84 kg/m   Intake/Output Summary (Last 24 hours) at 03/24/2019 0737 Last data filed at 03/24/2019 0600 Gross per 24 hour  Intake 438.58 ml  Output 700 ml  Net -261.42 ml   Weight change:   Physical Exam: Gen: Somnolent, comfortable CVS: Irregularly irregular, S1 and S2 normal Resp: Fine rales both bases, no distinct rhonchi Abd: Soft, flat, nontender Ext: No lower extremity edema  Imaging: US Renal  Result Date:  03/22/2019 CLINICAL DATA:  Acute kidney injury. EXAM: RENAL / URINARY TRACT ULTRASOUND COMPLETE COMPARISON:  None. FINDINGS: Right Kidney: Renal measurements: 10.4 x 4.4 x 5.5 cm = volume: 132 mL. No hydronephrosis. Diffusely increased parenchymal echogenicity. Left Kidney: Renal measurements: 9.7 x 4.5 x 5.2 cm = volume: 119 mL. No hydronephrosis. Diffusely increased parenchymal echogenicity. Simple cyst in the lower kidney measuring 1.5 cm. Small amount of perinephric fluid. Bladder: Decompressed by Foley catheter and not well assessed. Right pleural effusion and right upper quadrant ascites incidentally noted. IMPRESSION: 1. Increased bilateral renal echogenicity suggesting chronic medical renal disease. No hydronephrosis. 2. Small left perinephric fluid, nonspecific but can be seen with urinary tract infection. Electronically Signed   By: Keith Rake M.D.   On: 03/22/2019 20:24   [place] Ekg Site Rite  Result Date: 03/23/2019 If Site Rite image not attached, placement could not be confirmed due to current cardiac rhythm.   Labs: BMET Recent Labs  Lab 03/17/19 0805 03/18/19 8657 03/19/19 0410 03/20/19 0856 03/21/19 0756 03/22/19 0234 03/23/19 0656  NA 134* 136 133* 135 134* 135 135  K 3.2* 2.7* 3.1* 2.9* 3.4* 4.1 4.7  CL 99 100 100 101 101 100 100  CO2 24 23 22 23 22 24  20*  GLUCOSE 127* 140* 73 105* 121* 90 104*  BUN 76* 73* 63* 51* 48* 48* 58*  CREATININE 2.29* 2.24* 2.12* 2.14* 2.33* 2.54* 3.21*  CALCIUM 7.9* 8.1* 7.6* 7.9* 8.1* 8.5* 8.6*   CBC Recent Labs  Lab 03/19/19 0553  03/19/19 2228 03/20/19 0856 03/21/19 0756 03/22/19 0234  WBC  5.2  --   --  6.2 6.9 8.1  HGB 6.6*   < > 10.2* 9.9* 10.1* 10.6*  HCT 20.2*   < > 28.9* 28.3* 27.9* 29.4*  MCV 81.8  --   --  83.5 83.5 83.8  PLT 109*  --   --  117* 145* 181   < > = values in this interval not displayed.    Medications:    . amiodarone  200 mg Oral Daily  . atorvastatin  80 mg Oral q1800  . carvedilol  3.125 mg  Oral BID WC  . insulin aspart  0-15 Units Subcutaneous TID WC  . pantoprazole  40 mg Oral BID  . senna-docusate  1 tablet Oral BID  . sodium chloride flush  10-40 mL Intracatheter Q12H   Elmarie Shiley, MD 03/24/2019, 7:37 AM

## 2019-03-24 NOTE — Progress Notes (Signed)
PROGRESS NOTE    Aaron Mclean  NWG:[id number] DOB: July 27, 1951 DOA: 03/16/2019 PCP: Aaron Collie, MD    Brief Narrative: Aaron Mclean a 68 y.o.malewith medical history significant ofdiabetes type 2 uncontrolled with peripheral vascular disease status post toe amputations, atrial fibrillation, dementia, chronic kidney disease stage III, struct of sleep apnea on CPAP, hypertension, dyslipidemia and congestive heart failure with an ejection fraction of 10 to 15% thought worsened by PVCs. Patient was last discharged from our facility in December 2019 when he was placed on amiodarone in hopes that they could suppress his PVCs and improve his ejection fraction. Follow-up was planned but patient was lost to follow-up when [facility] entered [facility] [place].     Patient was sent from the nursing facility to [facility] on morning of 4/5 area early this morning patient complained of dark stools at the facility Was not anticoagulated.   Previous colonoscopy done over a decade ago demonstrated diverticulosis and internal hemorrhoids.  Hemoglobin at 12.7, was 15.4 at facility.  Transferred to [facility] due to lack of GI coverage at [facility].  Over the next 24 hours, hemoglobin has continued to trend downward and currently at 8.5 with increase in patient's creatinine.  Patient himself is quite somnolent.  Patient taken for endoscopy noting multiple gastric and duodenal ulcers, nonbleeding.    Assessment & Plan:   Principal Problem:   Diverticulosis of intestine with bleeding Active Problems:   Benign prostatic hyperplasia with urinary obstruction   Chronic systolic CHF (congestive heart failure), NYHA class 4 (HCC)   OSA on CPAP   DM (diabetes mellitus), type 2 with peripheral vascular complications (HCC)   CKD (chronic kidney disease) stage 3, GFR 30-59 ml/min (HCC)   Essential hypertension   Dyslipidemia   Pressure injury of skin   Goals of care,  counseling/discussion   Palliative care by specialist   Acute on chronic systolic heart failure (HCC)   AKI (acute kidney injury) ([place])   Anemia   1-GI bleed upper; related to multiples duodenal ulcer with VV bleeding s/p injection cauterization, gastric ulcer erosive gastropathy, esophagitis, barrett esophagus.  -appreciate GI evaluation.  -Treated initially with IV protonix, plan to change to oral today.  -no melena ro bloody stool reported.  Hb increase appropriately to 9.9.  No further bleeding.  Hb stable.   2-Acute blood loss anemia; in setting of GI bleed, see problem one.  S/P 2 units PRBC on 03-19-2019. Hb stable at 10  3-hypokalemia; resolved  4-Dementia; stable.   5-Acute on Chronic systolic Heart failure EF 10--15 % He is complaining of SOB, Chest x ray with asymmetric edema. Received 60 mg IV lasix 4-10. Cr increase  to 2.5 on 4-11, nephrology was consulted and recommend [place] and hold lasix.  Nephrology is recommending HF team consultation. Cardiology consulted.  Started on milrinone Gtt.  IV lasix by renal.   OSA; CPAP.  Acute on chronic renal failure; stage III;  Relate to hypovolemia.  Prior cr 1.5. --1.7\ Initially increase cr was related to hypoperfusion from anemia.  He has had multiples episodes of urine retention, will proceed with foley catheter placement.  Cr got worse with lasix 60 mg time one dose.  Nephrology consulted, [place] with perinephric fluid,  evidence of infection. Started ceftriaxone.  Follow urine culture.   UTI;  [place] with perinephric fluid,  evidence of infection. Started ceftriaxone. Change to vancomycin urine culture growing enterococcus fecalis.  Follow urine culture.    Hypotension; blood transfusion.  High 90.  BP  improved.   Hypoglycemia; poor oral intake. Hypoglycemia last night. Lantus was stopped.   PVC; on amiodarone.    Pressure injury of skin wound breakdown noted on bridge of patient's nose.  Noted by nursing, present  on admission.  Gel barrier placed   Pressure Injury 03/16/19 Unstageable - Full thickness tissue loss in which the base of the ulcer is covered by slough (yellow, tan, gray, green or brown) and/or eschar (tan, brown or black) in the wound bed. small shallow ulceration with yellowish base. (Active)  03/16/19 1230  Location:   Location Orientation:   Staging: Unstageable - Full thickness tissue loss in which the base of the ulcer is covered by slough (yellow, tan, gray, green or brown) and/or eschar (tan, brown or black) in the wound bed.  Wound Description (Comments): small shallow ulceration with yellowish base.  Present on Admission: Yes     Estimated body mass index is 24.84 kg/m as calculated from the following:   Height as of this encounter: 5\' 9"  (1.753 m).   Weight as of this encounter: 76.3 kg.   DVT prophylaxis: scd Code Status: DNR Family Communication: palliative care has discussed case with family today.   Disposition Plan:  Remain in the hospital for treatment of Heart failure, renal failure.    Consultants:   GI   Procedures:   Endoscopy;    Antimicrobials:none   Subjective: Sleepy, open eyes to voice. Feels tired.   Objective: Vitals:   03/24/19 0800 03/24/19 0933 03/24/19 1126 03/24/19 1200  BP: 97/70 105/71 98/68   Pulse: 97 (!) 103    Resp: 12  15   Temp:   (!) 97.4 F (36.3 C)   TempSrc:   Oral   SpO2: 99%   98%  Weight:      Height:        Intake/Output Summary (Last 24 hours) at 03/24/2019 1521 Last data filed at 03/24/2019 1446 Gross per 24 hour  Intake 544.4 ml  Output 1925 ml  Net -1380.6 ml   Filed Weights   03/21/19 2027 03/22/19 0559 03/24/19 0315  Weight: 78.9 kg 77.6 kg 76.3 kg    Examination:  General exam: Sleepy.  Respiratory system: Crackles bases.  Cardiovascular system: S 1, S 2 RRR Gastrointestinal system: BS present, soft, nt Central nervous system: sleepy  Extremities: symmetric power.  Skin: no rashes.    Data Reviewed: I have personally reviewed following labs and imaging studies  CBC: Recent Labs  Lab 03/18/19 1728 03/19/19 0553 03/19/19 2005 03/19/19 2228 03/20/19 0856 03/21/19 0756 03/22/19 0234  WBC 7.0 5.2  --   --  6.2 6.9 8.1  HGB 7.8* 6.6* 10.0* 10.2* 9.9* 10.1* 10.6*  HCT 23.1* 20.2* 27.9* 28.9* 28.3* 27.9* 29.4*  MCV 82.2 81.8  --   --  83.5 83.5 83.8  PLT 112* 109*  --   --  117* 145* 798   Basic Metabolic Panel: Recent Labs  Lab 03/20/19 0856 03/21/19 0756 03/22/19 0234 03/23/19 0656 03/24/19 0923  NA 135 134* 135 135 136  K 2.9* 3.4* 4.1 4.7 4.2  CL 101 101 100 100 102  CO2 23 22 24  20* 21*  GLUCOSE 105* 121* 90 104* 134*  BUN 51* 48* 48* 58* 62*  CREATININE 2.14* 2.33* 2.54* 3.21* 3.22*  CALCIUM 7.9* 8.1* 8.5* 8.6* 8.0*   GFR: Estimated Creatinine Clearance: 21.7 mL/min (A) (by C-G formula based on SCr of 3.22 mg/dL (H)). Liver Function Tests: No  results for input(s): AST, ALT, ALKPHOS, BILITOT, PROT, ALBUMIN in the last 168 hours. No results for input(s): LIPASE, AMYLASE in the last 168 hours. No results for input(s): AMMONIA in the last 168 hours. Coagulation Profile: No results for input(s): INR, PROTIME in the last 168 hours. Cardiac Enzymes: No results for input(s): CKTOTAL, CKMB, CKMBINDEX, TROPONINI in the last 168 hours. BNP (last 3 results) No results for input(s): PROBNP in the last 8760 hours. HbA1C: No results for input(s): HGBA1C in the last 72 hours. CBG: Recent Labs  Lab 03/23/19 1156 03/23/19 1641 03/23/19 2114 03/24/19 0608 03/24/19 1129  GLUCAP 104* 94 120* 88 100*   Lipid Profile: No results for input(s): CHOL, HDL, LDLCALC, TRIG, CHOLHDL, LDLDIRECT in the last 72 hours. Thyroid Function Tests: No results for input(s): TSH, T4TOTAL, FREET4, T3FREE, THYROIDAB in the last 72 hours. Anemia Panel: No results for input(s): VITAMINB12, FOLATE, FERRITIN, TIBC, IRON, RETICCTPCT in the last 72 hours. Sepsis Labs: No results  for input(s): PROCALCITON, LATICACIDVEN in the last 168 hours.  Recent Results (from the past 240 hour(s))  MRSA PCR Screening     Status: None   Collection Time: 03/16/19  3:46 PM  Result Value Ref Range Status   MRSA by PCR NEGATIVE NEGATIVE Final    Comment:        The GeneXpert MRSA Assay (FDA approved for NASAL specimens only), is one component of a comprehensive MRSA colonization surveillance program. It is not intended to diagnose MRSA infection nor to guide or monitor treatment for MRSA infections. Performed at [facility], [place] [address], [place], [place] [address]   Culture, Urine     Status: Abnormal (Preliminary result)   Collection Time: 03/23/19  3:00 PM  Result Value Ref Range Status   Specimen Description URINE, CLEAN CATCH  Final   Special Requests   Final    NONE Performed at [facility], [place] [address] [place], [place] [address]    Culture >=100,000 COLONIES/mL ENTEROCOCCUS FAECALIS (A)  Final   Report Status PENDING  Incomplete         Radiology Studies: US Renal  Result Date: 03/22/2019 CLINICAL DATA:  Acute kidney injury. EXAM: RENAL / URINARY TRACT ULTRASOUND COMPLETE COMPARISON:  None. FINDINGS: Right Kidney: Renal measurements: 10.4 x 4.4 x 5.5 cm = volume: 132 mL. No hydronephrosis. Diffusely increased parenchymal echogenicity. Left Kidney: Renal measurements: 9.7 x 4.5 x 5.2 cm = volume: 119 mL. No hydronephrosis. Diffusely increased parenchymal echogenicity. Simple cyst in the lower kidney measuring 1.5 cm. Small amount of perinephric fluid. Bladder: Decompressed by Foley catheter and not well assessed. Right pleural effusion and right upper quadrant ascites incidentally noted. IMPRESSION: 1. Increased bilateral renal echogenicity suggesting chronic medical renal disease. No hydronephrosis. 2. Small left perinephric fluid, nonspecific but can be seen with urinary tract infection. Electronically Signed   By: Keith Rake M.D.   On:  03/22/2019 20:24   [place] Ekg Site Rite  Result Date: 03/23/2019 If Site Rite image not attached, placement could not be confirmed due to current cardiac rhythm.       Scheduled Meds: . amiodarone  400 mg Oral BID  . atorvastatin  80 mg Oral q1800  . insulin aspart  0-15 Units Subcutaneous TID WC  . pantoprazole  40 mg Oral BID  . senna-docusate  1 tablet Oral BID  . sodium chloride flush  10-40 mL Intracatheter Q12H   Continuous Infusions: . sodium chloride    . milrinone  0.25 mcg/kg/min (03/24/19 1400)     LOS: 8 days    Time spent: 35 minutes.     Elmarie Shiley, MD [facility] Pager [phone] If 7PM-7AM, please contact night-coverage [email or web address] Password [facility] 03/24/2019, 3:21 PM

## 2019-03-24 NOTE — Plan of Care (Signed)
  Problem: Health Behavior/Discharge Planning: Goal: Ability to manage health-related needs will improve Outcome: Progressing   Problem: Activity: Goal: Risk for activity intolerance will decrease Outcome: Progressing   Problem: Nutrition: Goal: Adequate nutrition will be maintained Outcome: Progressing   Problem: Elimination: Goal: Will not experience complications related to bowel motility Outcome: Progressing Goal: Will not experience complications related to urinary retention Outcome: Progressing   Problem: Safety: Goal: Ability to remain free from injury will improve Outcome: Progressing

## 2019-03-24 NOTE — Progress Notes (Addendum)
Daily Progress Note   Patient Name: Aaron Mclean       Date: 03/24/2019 DOB: 1951/02/10  Age: 68 y.o. MRN#: [id number] Attending Physician: Elmarie Shiley, MD Primary Care Physician: Marco Collie, MD Admit Date: 03/16/2019  Reason for Consultation/Follow-up: Establishing goals of care  Subjective: Per report and chart review - patient weak and sleepy today. Not eating much d/t lethargy.  Length of Stay: 8  Current Medications: Scheduled Meds:  . amiodarone  400 mg Oral BID  . atorvastatin  80 mg Oral q1800  . insulin aspart  0-15 Units Subcutaneous TID WC  . pantoprazole  40 mg Oral BID  . senna-docusate  1 tablet Oral BID  . sodium chloride flush  10-40 mL Intracatheter Q12H    Continuous Infusions: . sodium chloride    . milrinone 0.25 mcg/kg/min (03/24/19 1400)    PRN Meds: sodium chloride, acetaminophen **OR** acetaminophen, ondansetron **OR** ondansetron (ZOFRAN) IV, sodium chloride flush, sodium chloride flush, traMADol       Vital Signs: BP 98/68 (BP Location: Left Arm)   Pulse (!) 103   Temp (!) 97.4 F (36.3 C) (Oral)   Resp 15   Ht 5\' 9"  (1.753 m)   Wt 76.3 kg   SpO2 98%   BMI 24.84 kg/m  SpO2: SpO2: 98 % O2 Device: O2 Device: Nasal Cannula O2 Flow Rate: O2 Flow Rate (L/min): 2 L/min  Intake/output summary:   Intake/Output Summary (Last 24 hours) at 03/24/2019 1536 Last data filed at 03/24/2019 1446 Gross per 24 hour  Intake 544.4 ml  Output 1925 ml  Net -1380.6 ml   LBM: Last BM Date: 03/22/19 Baseline Weight: Weight: 81.2 kg Most recent weight: Weight: 76.3 kg       Palliative Assessment/Data: PPS 40%    Flowsheet Rows     Most Recent Value  Intake Tab  Referral Department  Hospitalist  Unit at Time of Referral  Cardiac/Telemetry Unit  Palliative  Care Primary Diagnosis  Cardiac  Date Notified  03/18/19  Palliative Care Type  New Palliative care  Reason for referral  Clarify Goals of Care  Date of Admission  03/16/19  Date first seen by Palliative Care  03/18/19  # of days Palliative referral response time  0 Day(s)  # of days IP prior to Palliative referral  2  Clinical Assessment  Palliative Performance Scale Score  40%  Psychosocial & Spiritual Assessment  Palliative Care Outcomes  Patient/Family meeting held?  Yes  Who was at the meeting?  son  Palliative Care Outcomes  Clarified goals of care, Provided advance care planning, Provided psychosocial or spiritual support, Changed CPR status, Completed durable DNR, Linked to palliative care logitudinal support      Patient Active Problem List   Diagnosis Date Noted  . Acute on chronic systolic heart failure ([place]) 03/24/2019  . AKI (acute kidney injury) ([place]) 03/24/2019  . Anemia 03/24/2019  . Goals of care, counseling/discussion   . Palliative care by specialist   . Pressure injury of skin 03/17/2019  . Diverticulosis of intestine with bleeding 03/16/2019  . Chronic systolic CHF (congestive heart failure), NYHA class 4 ([place]) 12/02/2018  . OSA on CPAP 12/02/2018  .  DM (diabetes mellitus), type 2 with peripheral vascular complications ([place]) 76/28/3151  . CKD (chronic kidney disease) stage 3, GFR 30-59 ml/min ([facility]) 12/02/2018  . Essential hypertension 12/02/2018  . Dyslipidemia 12/02/2018  . Benign prostatic hyperplasia with urinary obstruction 09/20/2015  . Family history of malignant neoplasm of prostate 09/20/2015  . Excessive urination at night 09/20/2015    Palliative Care Assessment & Plan   HPI: 68 y.o. male  with past medical history of CKD, T2DM, PVD with toe amputations, a fib, dementia, OSA on CPAP, HTN, HLD, and CHF w/ EF 10-15% admitted on 03/16/2019 with bloody stools. Recently discharged in Dec 2019 after HF exacerbation, started on amiodarone for PVCs.  Patient had a subsequent hospitalization at [facility] (family unsure why - dehydration?), and following this hospitalization he was placed in rehab at [facility] in [place]. Patient initially sent to [facility] again for bloody stools and transferred to [facility] d/t need for GI consult. Throughout admission, hgb has trended. Endoscopy 4/6 revealed multiple gastric and duodenal ulcers, nonbleeding. Hgb now stabilized. PMT consulted for [place].   Assessment: Follow up requesting by Dr. Tyrell Antonio d/t continued medical issues - patient now with worsening creatinine - up to 3.2. He was placed on milrinone for possible heart failure exacerbation.   Discussed case with heart failure team - Darrick Grinder, NP - discussed plan of care to continue milrinone drip - will have better idea in 24-48 hours how this will affect kidney function. NOT a candidate for home inotropes or advanced therapies.   Discussed the above with patient's son, Aaron Mclean. He expresses understanding. His main goal is that his father not die in the hospital. He tells me if the prognosis is poor, he would like to know that and would prefer to bring his father home with the support of hospice so that he does not spend his final days in the hospital. However, he also shares that he hopes this is not the case - he is hopeful his father improves enough and is able to move forward with plan for rehab. We discussed allowing 24-48 hours to see how patient does. If stabilizes, move forward with plan for SNF and palliative to follow. If no improvement/decline - home with hospice. He is agreeable to continued support from PMT in the coming days to continue goals of care discussions.  Above conversations discussed with Dr. Tyrell Antonio.   Recommendations/Plan:  Per previous discussion - patient changed to DNR  Continue milrinone - allow 24-48 hours - if improves, SNF with palliative, if decline - home with hospice  Family's main goal is that patient not die in the  hospital - would like his "final days" to be at home  Code Status:  DNR  Prognosis:   Unable to determine - at high risk for acute decompensation  Discharge Planning:  To Be Determined  Care plan was discussed with Darrick Grinder, NP and Dr. Tyrell Antonio  Thank you for allowing the Palliative Medicine Team to assist in the care of this patient.   The above conversation was completed via telephone due to the visitor restrictions during the COVID-19 pandemic. Thorough chart review and discussion with necessary members of the care team was completed as part of assessment. All issues were discussed and addressed but no physical exam was performed.   Total Time 35 mintues Prolonged Time Billed  no       Greater than 50%  of this time was spent counseling and coordinating care related to the above assessment and  plan.  Juel Burrow, DNP, [facility] Palliative Medicine Team Team Phone # [phone]  Pager [phone]

## 2019-03-25 ENCOUNTER — Encounter: Payer: Self-pay | Admitting: Gastroenterology

## 2019-03-25 LAB — BASIC METABOLIC PANEL
Anion gap: 11 (ref 5–15)
BUN: 53 mg/dL — ABNORMAL HIGH (ref 8–23)
CO2: 24 mmol/L (ref 22–32)
Calcium: 7.9 mg/dL — ABNORMAL LOW (ref 8.9–10.3)
Chloride: 99 mmol/L (ref 98–111)
Creatinine, Ser: 3.12 mg/dL — ABNORMAL HIGH (ref 0.61–1.24)
GFR calc Af Amer: 22 mL/min — ABNORMAL LOW (ref >60)
GFR calc non Af Amer: 19 mL/min — ABNORMAL LOW (ref >60)
Glucose, Bld: 144 mg/dL — ABNORMAL HIGH (ref 70–99)
Potassium: 3.5 mmol/L (ref 3.5–5.1)
Sodium: 134 mmol/L — ABNORMAL LOW (ref 135–145)

## 2019-03-25 LAB — GLUCOSE, CAPILLARY
Glucose-Capillary: 98 mg/dL (ref 70–99)
Glucose-Capillary: 87 mg/dL (ref 70–99)
Glucose-Capillary: 130 mg/dL — ABNORMAL HIGH (ref 70–99)
Glucose-Capillary: 130 mg/dL — ABNORMAL HIGH (ref 70–99)

## 2019-03-25 LAB — CBC
HCT: 24.6 % — ABNORMAL LOW (ref 39.0–52.0)
Hemoglobin: 9.2 g/dL — ABNORMAL LOW (ref 13.0–17.0)
MCH: 33 pg (ref 26.0–34.0)
MCHC: 37.4 g/dL — ABNORMAL HIGH (ref 30.0–36.0)
MCV: 88.2 fL (ref 80.0–100.0)
Platelets: 145 10*3/uL — ABNORMAL LOW (ref 150–400)
RBC: 2.79 MIL/uL — ABNORMAL LOW (ref 4.22–5.81)
RDW: 17.9 % — ABNORMAL HIGH (ref 11.5–15.5)
WBC: 9.1 10*3/uL (ref 4.0–10.5)
nRBC: 0 % (ref 0.0–0.2)

## 2019-03-25 LAB — COOXEMETRY PANEL
Carboxyhemoglobin: 1.6 % — ABNORMAL HIGH (ref 0.5–1.5)
Methemoglobin: 1 % (ref 0.0–1.5)
O2 Saturation: 74.2 %
Total hemoglobin: 9.9 g/dL — ABNORMAL LOW (ref 12.0–16.0)

## 2019-03-25 LAB — MAGNESIUM: Magnesium: 1.8 mg/dL (ref 1.7–2.4)

## 2019-03-25 MED ORDER — MILRINONE LACTATE IN DEXTROSE 20-5 MG/100ML-% IV SOLN
0.1250 ug/kg/min | INTRAVENOUS | Status: DC
  Administered 2019-03-25: 0.125 ug/kg/min via INTRAVENOUS

## 2019-03-25 NOTE — Progress Notes (Signed)
Physical Therapy Treatment Patient Details Name: Aaron Mclean MRN: [id number] DOB: [birth year] Today's Date: 03/25/2019    History of Present Illness Pt is a 68 y/o male admitted secondary to diverticulosis of intestine and anemia. Pt is s/p EGD that revelaed duodenal and gastric ulcers. PMH includes CHF, OSA on CPAP, DM, CKD, HTN, a fib, and dementia.     PT Comments    Pt was agreeable and participative.  Gait fatigued pt out to the point he felt nauseated and not able to walk further around the bed to sit up and pt asked to lay back down.    Follow Up Recommendations  SNF;Supervision/Assistance - 24 hour     Equipment Recommendations  None recommended by PT    Recommendations for Other Services       Precautions / Restrictions Precautions Precautions: Fall    Mobility  Bed Mobility Overal bed mobility: Needs Assistance Bed Mobility: Supine to Sit;Sit to Supine     Supine to sit: Mod assist Sit to supine: Mod assist   General bed mobility comments: pt moved his legs off without assist, then needed assist up via R elbow  Transfers Overall transfer level: Needs assistance   Transfers: Sit to/from Stand Sit to Stand: Mod assist;+2 safety/equipment         General transfer comment: cues for hand placement and assist to both come forward and up.  Ambulation/Gait Ambulation/Gait assistance: Min assist Gait Distance (Feet): 80 Feet Assistive device: Rolling walker (2 wheeled) Gait Pattern/deviations: Step-through pattern Gait velocity: decreased   General Gait Details: mildly unsteady with flexed posture/head down unless cued to stand upright.   Stairs             Wheelchair Mobility    Modified Rankin (Stroke Patients Only)       Balance Overall balance assessment: Needs assistance Sitting-balance support: Feet supported Sitting balance-[name]: Fair       Standing balance-[name]: Fair Standing balance comment: RW to grab as  needed                            Cognition Arousal/Alertness: Lethargic;Awake/alert Behavior During Therapy: Flat affect Overall Cognitive Status: History of cognitive impairments - at baseline                                        Exercises Other Exercises Other Exercises: warm up/resistive ROM ex into hip/knee flexion /ext.    General Comments        Pertinent Vitals/Pain Faces Pain Scale: No hurt    Home Living                      Prior Function            PT Goals (current goals can now be found in the care plan section) Acute Rehab PT Goals Patient Stated Goal: to feel better PT Goal Formulation: With patient Time For Goal Achievement: 04/01/19 Potential to Achieve Goals: Fair Progress towards PT goals: Progressing toward goals    Frequency    Min 2X/week      PT Plan Current plan remains appropriate    Co-evaluation              AM-PAC PT "6 Clicks" Mobility   Outcome Measure  Help needed turning from your back to your side  while in a flat bed without using bedrails?: A Lot Help needed moving from lying on your back to sitting on the side of a flat bed without using bedrails?: A Lot Help needed moving to and from a bed to a chair (including a wheelchair)?: A Lot Help needed standing up from a chair using your arms (e.g., wheelchair or bedside chair)?: A Lot Help needed to walk in hospital room?: A Little Help needed climbing 3-5 steps with a railing? : A Lot 6 Click Score: 13    End of Session   Activity Tolerance: Patient limited by fatigue Patient left: in bed;with call bell/phone within reach Nurse Communication: Mobility status PT Visit Diagnosis: Unsteadiness on feet (R26.81);Muscle weakness (generalized) (M62.81)     Time: 2060-1561 PT Time Calculation (min) (ACUTE ONLY): 19 min  Charges:  $Gait Training: 8-22 mins                     03/25/2019  Aaron Mclean, PT Acute Rehabilitation  Services [phone]  (pager) [phone]  (office)   Aaron Mclean 03/25/2019, 1:48 PM

## 2019-03-25 NOTE — Progress Notes (Deleted)
Advanced Heart Failure Rounding Note  PCP-Cardiologist: Glori Bickers, MD   Subjective:     Started on milrinone 0.25 mcg with worsening renal function. PICC placed. CO-oX stable 74%     Objective:   Weight Range: 76.6 kg Body mass index is 24.94 kg/m.   Vital Signs:   Temp:  [97.4 F (36.3 C)-98.8 F (37.1 C)] 97.7 F (36.5 C) (04/14 0718) Pulse Rate:  [46-107] 49 (04/14 0300) Resp:  [10-17] 14 (04/14 0718) BP: (98-106)/(57-70) 106/57 (04/14 0718) SpO2:  [98 %] 98 % (04/14 0300) Weight:  [76.6 kg] 76.6 kg (04/14 0300) Last BM Date: 03/22/19  Weight change: Filed Weights   03/22/19 0559 03/24/19 0315 03/25/19 0300  Weight: 77.6 kg 76.3 kg 76.6 kg    Intake/Output:   Intake/Output Summary (Last 24 hours) at 03/25/2019 1048 Last data filed at 03/25/2019 0815 Gross per 24 hour  Intake 1533.16 ml  Output 3275 ml  Net -1741.84 ml      Physical Exam   CVP 4  General:  No resp difficulty HEENT: normal Neck: supple. no JVD. Carotids 2+ bilat; no bruits. No lymphadenopathy or thryomegaly appreciated. Cor: PMI nondisplaced. Irregular rate & rhythm. No rubs, gallops or murmurs. Lungs: clear Abdomen: soft, nontender, nondistended. No hepatosplenomegaly. No bruits or masses. Good bowel sounds. Extremities: no cyanosis, clubbing, rash, edema. RUE PICC  Neuro: alert & orientedx3, cranial nerves grossly intact. moves all 4 extremities w/o difficulty. Affect pleasant   Telemetry  ? A fib 100s   EKG  n/a  Labs    CBC Recent Labs    03/25/19 0436  WBC 9.1  HGB 9.2*  HCT 24.6*  MCV 88.2  PLT 222*   Basic Metabolic Panel Recent Labs    03/24/19 0923 03/25/19 0436  NA 136 134*  K 4.2 3.5  CL 102 99  CO2 21* 24  GLUCOSE 134* 144*  BUN 62* 53*  CREATININE 3.22* 3.12*  CALCIUM 8.0* 7.9*  MG  --  1.8   Liver Function Tests No results for input(s): AST, ALT, ALKPHOS, BILITOT, PROT, ALBUMIN in the last 72 hours. No results for input(s): LIPASE,  AMYLASE in the last 72 hours. Cardiac Enzymes No results for input(s): CKTOTAL, CKMB, CKMBINDEX, TROPONINI in the last 72 hours.  BNP: BNP (last 3 results) No results for input(s): BNP in the last 8760 hours.  ProBNP (last 3 results) No results for input(s): PROBNP in the last 8760 hours.   D-Dimer No results for input(s): DDIMER in the last 72 hours. Hemoglobin A1C No results for input(s): HGBA1C in the last 72 hours. Fasting Lipid Panel No results for input(s): CHOL, HDL, LDLCALC, TRIG, CHOLHDL, LDLDIRECT in the last 72 hours. Thyroid Function Tests No results for input(s): TSH, T4TOTAL, T3FREE, THYROIDAB in the last 72 hours.  Invalid input(s): FREET3  Other results:   Imaging     No results found.   Medications:     Scheduled Medications:  amiodarone  400 mg Oral BID   atorvastatin  80 mg Oral q1800   insulin aspart  0-15 Units Subcutaneous TID WC   pantoprazole  40 mg Oral BID   senna-docusate  1 tablet Oral BID   sodium chloride flush  10-40 mL Intracatheter Q12H     Infusions:  sodium chloride     milrinone 0.25 mcg/kg/min (03/25/19 0300)   vancomycin Stopped (03/24/19 1810)     PRN Medications:  sodium chloride, acetaminophen **OR** acetaminophen, ondansetron **OR** ondansetron (ZOFRAN) IV, sodium chloride  flush, sodium chloride flush, traMADol    Patient Profile   Aaron Mclean is a 68 year old with a history of DM2, neuropathy, HTN, CKD 3,OSA,PVCs, CAD, and hyperlipidemia. He had newly diagnosed mod to severe Aaron and acute systolic HF with EF 67-34 diagnosed in 11/2018.    Admitted from [facility] with GI bleed.    Assessment/Plan  1. Symptomatic anemia due to acute Upper GI Bleed - GI consulted. Received total of 2UPRBCs.  - S/P EGD on 4/6 with gastritis and duodenal ulcers. 1 with APC.  - Hgb trending down 10.6>9.2  - On PPI.   2. A/C Systolic Heart Failure, mixed ICM/NICM   - ECHO on 03/16/19 EF 15%.  - Now on milrinone  empirically. Co-ox 74%on milrinone 0.25.  - Will continue milrinone for now until renal function starts to improve. Then wean.  -CVp 4. Continue hold diuretics.  - - Stop b-blocker - No dig, losartan, or spiro due to elevated creatinine.  - Not candidate for home inotropes of advanced therapies   3. AKI on CKD Stage III -Creatinine trending up from 2.2--> 3.2 (now plateauing). Suspect ATN from hypotension in setting of GIB. +/- cardio renal syndrome. Continue milrinone for now.  - Holding diuretics.  - Renal US was negative for hydronephrosis.  - Nephrology following.    4. PVCs - Has extremely high burden. May be contributing to cardiomyopathy  - Increase amio to 400 bid - Keep K> 4.0 Mg >2.0  5. ? PAF on monitor today - this would be new - check ECG. - on iv amio for PVCs.  - No AC with acute GI bleed.    6. CAD  - LHC 2019 with :Prox RCA to Mid RCA lesion is 80% stenosed. Mid RCA lesion is 60% stenosed.Dist RCA lesion is 20% stenosed with 20% stenosed side branch in Post Atrio. Prox Cx lesion is 30% stenosed. Mid Cx lesion is 100% stenosed. Ost 2nd Mrg to 2nd Mrg lesion is 70% stenosed. Ost LM lesion is 30% stenosed. Prox LAD lesion is 30% stenosed. Mid LAD to Dist LAD lesion is 60% stenosed.  - No evidence ACS - On high dose statin. No asa with GI bleed.   7. DNR - He is very debilitated Palliative Care following.   He is not a candidate for aggressive HF management. Will continue milrinone until renal function recovers and then will stop and not restart.   Cotinue to hold diuretics. EKG now. May need to stop milrinone with low CVP as this is likley contributing to A fib. Add SCDs for DVT prophylaxis.   Plans to return to [facility]  Length of Stay: [place], NP  03/25/2019, 10:48 AM  Advanced Heart Failure Team Pager [phone] (M-F; 7a - 4p)  Please contact [facility] for night-coverage after hours (4p -7a ) and weekends on [email or web address]

## 2019-03-25 NOTE — Progress Notes (Signed)
PROGRESS NOTE    Aaron Mclean  ZOX:[id number] DOB: [birth year] DOA: 03/16/2019 PCP: Marco Collie, MD    Brief Narrative: Aaron Mclean a 68 y.o.malewith medical history significant ofdiabetes type 2 uncontrolled with peripheral vascular disease status post toe amputations, atrial fibrillation, dementia, chronic kidney disease stage III, struct of sleep apnea on CPAP, hypertension, dyslipidemia and congestive heart failure with an ejection fraction of 10 to 15% thought worsened by PVCs. Patient was last discharged from our facility in December 2019 when he was placed on amiodarone in hopes that they could suppress his PVCs and improve his ejection fraction. Follow-up was planned but patient was lost to follow-up when he entered [facility] [place].     Patient was sent from the nursing facility to [facility] on morning of 4/5 area early this morning patient complained of dark stools at the facility Was not anticoagulated.   Previous colonoscopy done over a decade ago demonstrated diverticulosis and internal hemorrhoids. Hemoglobin at 12.7, was 15.4 at facility.    Transferred to Aaron Mclean due to lack of GI coverage at [facility]. Over the next 24 hours, hemoglobin has continued to trend downward 8.5 with increase in patient's creatinine. Patient taken for endoscopy noting multiple gastric and duodenal ulcers, nonbleeding.  Patient required 2 units of packed red blood cells during hospitalization due to fever decreasing hemoglobin.  Patient to be on PPI twice daily.  His hemoglobin remained stable.  Patient hospital course complicated by worsening kidney function, initially related to hypoperfusion from GI bleed, subsequently related to low cardiac  Output.  Heart failure team was consulted, patient was a started milrinone. Plan is to see how patient progress over 24 --48 on IV milrinone.  Palliative will continue to follow and give support to family.      Assessment & Plan:   Principal Problem:   Diverticulosis of intestine with bleeding Active Problems:   Benign prostatic hyperplasia with urinary obstruction   Chronic systolic CHF (congestive heart failure), NYHA class 4 (HCC)   OSA on CPAP   DM (diabetes mellitus), type 2 with peripheral vascular complications (HCC)   CKD (chronic kidney disease) stage 3, GFR 30-59 ml/min (HCC)   Essential hypertension   Dyslipidemia   Pressure injury of skin   Goals of care, counseling/discussion   Palliative care by specialist   Acute on chronic systolic heart failure (HCC)   AKI (acute kidney injury) ([place])   Anemia   1-GI bleed upper; related to multiples duodenal ulcer with VV bleeding s/p injection cauterization, gastric ulcer erosive gastropathy, esophagitis, barrett esophagus.  -appreciate GI evaluation.  -Treated initially with IV protonix, plan to change to oral today.  -no melena ro bloody stool reported.  Hb increase appropriately to 9.9.  No further bleeding.  Hb stable.   2-Acute blood loss anemia; in setting of GI bleed, see problem one.  S/P 2 units PRBC on 03-19-2019.  3-hypokalemia; resolved  4-Dementia; stable.   5-Acute on Chronic systolic Heart failure EF 10--15 % He was  complaining of SOB, Chest x ray with asymmetric edema. Received 60 mg IV lasix 4-10. Cr increase  to 2.5 on 4-11, nephrology was consulted and recommend [place] and hold lasix.  Nephrology subsequently  recommend HF team consultation. Cardiology consulted.  Started on milrinone Gtt.  Holding lasix due to low CVP.   OSA; CPAP.  Acute on chronic renal failure; stage III;  Relate to hypovolemia initially, now related to Heart failure.  Prior cr 1.5. --  1.7\ He has had multiples episodes of urine retention, proceed with foley catheter placement.  Nephrology consulted, [place] with perinephric fluid,  evidence of infection. Started on antibiotics.  Cr slightly decrease today, good urine out put yesterday.  Cr  today at 3.1  UTI;  [place] with perinephric fluid,  evidence of infection. Started ceftriaxone. Change to vancomycin urine culture growing enterococcus fecalis.  Follow sensitivity.   Hypotension; BP  improved. Now on midodrine.   Hypoglycemia; poor oral intake. Hypoglycemia last night. Lantus was stopped.   PVC; on amiodarone.    Pressure injury of skin wound breakdown noted on bridge of patient's nose.  Noted by nursing, present on admission.  Gel barrier placed   Pressure Injury 03/16/19 Unstageable - Full thickness tissue loss in which the base of the ulcer is covered by slough (yellow, tan, gray, green or brown) and/or eschar (tan, brown or black) in the wound bed. small shallow ulceration with yellowish base. (Active)  03/16/19 1230  Location:   Location Orientation:   Staging: Unstageable - Full thickness tissue loss in which the base of the ulcer is covered by slough (yellow, tan, gray, green or brown) and/or eschar (tan, brown or black) in the wound bed.  Wound Description (Comments): small shallow ulceration with yellowish base.  Present on Admission: Yes     Estimated body mass index is 24.94 kg/m as calculated from the following:   Height as of this encounter: 5\' 9"  (1.753 m).   Weight as of this encounter: 76.6 kg.   DVT prophylaxis: scd Code Status: DNR Family Communication: palliative care has discussed case with family today.   Disposition Plan:  Remain in the hospital for treatment of Heart failure, renal failure.    Consultants:   GI   Procedures:   Endoscopy;    Antimicrobials:none   Subjective: He feels less weak today. He denies dyspnea. Complaining of neuropathy on his feet.   Objective: Vitals:   03/24/19 1915 03/24/19 2300 03/25/19 0300 03/25/19 0718  BP:  102/70 102/63 (!) 106/57  Pulse:  84 (!) 49   Resp: 10 14 17 14   Temp:  (!) 97.4 F (36.3 C) 97.8 F (36.6 C) 97.7 F (36.5 C)  TempSrc:  Oral Oral   SpO2:  98% 98%   Weight:    76.6 kg   Height:        Intake/Output Summary (Last 24 hours) at 03/25/2019 1022 Last data filed at 03/25/2019 0815 Gross per 24 hour  Intake 1533.16 ml  Output 3275 ml  Net -1741.84 ml   Filed Weights   03/22/19 0559 03/24/19 0315 03/25/19 0300  Weight: 77.6 kg 76.3 kg 76.6 kg    Examination:  General exam: NAD Respiratory system: Crackles bilaterally  Cardiovascular system: S 1, S 2 RRR Gastrointestinal system: BS present, soft, nt Central nervous system: alert, answer questions Extremities: no edema Skin: no rashes.   Data Reviewed: I have personally reviewed following labs and imaging studies  CBC: Recent Labs  Lab 03/19/19 0553  03/19/19 2228 03/20/19 0856 03/21/19 0756 03/22/19 0234 03/25/19 0436  WBC 5.2  --   --  6.2 6.9 8.1 9.1  HGB 6.6*   < > 10.2* 9.9* 10.1* 10.6* 9.2*  HCT 20.2*   < > 28.9* 28.3* 27.9* 29.4* 24.6*  MCV 81.8  --   --  83.5 83.5 83.8 88.2  PLT 109*  --   --  117* 145* 181 145*   < > = values in this interval  not displayed.   Basic Metabolic Panel: Recent Labs  Lab 03/21/19 0756 03/22/19 0234 03/23/19 0656 03/24/19 0923 03/25/19 0436  NA 134* 135 135 136 134*  K 3.4* 4.1 4.7 4.2 3.5  CL 101 100 100 102 99  CO2 22 24 20* 21* 24  GLUCOSE 121* 90 104* 134* 144*  BUN 48* 48* 58* 62* 53*  CREATININE 2.33* 2.54* 3.21* 3.22* 3.12*  CALCIUM 8.1* 8.5* 8.6* 8.0* 7.9*  MG  --   --   --   --  1.8   GFR: Estimated Creatinine Clearance: 22.3 mL/min (A) (by C-G formula based on SCr of 3.12 mg/dL (H)). Liver Function Tests: No results for input(s): AST, ALT, ALKPHOS, BILITOT, PROT, ALBUMIN in the last 168 hours. No results for input(s): LIPASE, AMYLASE in the last 168 hours. No results for input(s): AMMONIA in the last 168 hours. Coagulation Profile: No results for input(s): INR, PROTIME in the last 168 hours. Cardiac Enzymes: No results for input(s): CKTOTAL, CKMB, CKMBINDEX, TROPONINI in the last 168 hours. BNP (last 3 results) No  results for input(s): PROBNP in the last 8760 hours. HbA1C: No results for input(s): HGBA1C in the last 72 hours. CBG: Recent Labs  Lab 03/24/19 0608 03/24/19 1129 03/24/19 1625 03/24/19 2108 03/25/19 0643  GLUCAP 88 100* 149* 107* 98   Lipid Profile: No results for input(s): CHOL, HDL, LDLCALC, TRIG, CHOLHDL, LDLDIRECT in the last 72 hours. Thyroid Function Tests: No results for input(s): TSH, T4TOTAL, FREET4, T3FREE, THYROIDAB in the last 72 hours. Anemia Panel: No results for input(s): VITAMINB12, FOLATE, FERRITIN, TIBC, IRON, RETICCTPCT in the last 72 hours. Sepsis Labs: No results for input(s): PROCALCITON, LATICACIDVEN in the last 168 hours.  Recent Results (from the past 240 hour(s))  MRSA PCR Screening     Status: None   Collection Time: 03/16/19  3:46 PM  Result Value Ref Range Status   MRSA by PCR NEGATIVE NEGATIVE Final    Comment:        The GeneXpert MRSA Assay (FDA approved for NASAL specimens only), is one component of a comprehensive MRSA colonization surveillance program. It is not intended to diagnose MRSA infection nor to guide or monitor treatment for MRSA infections. Performed at [facility], [place] [address], [place], [place] [address]   Culture, Urine     Status: Abnormal (Preliminary result)   Collection Time: 03/23/19  3:00 PM  Result Value Ref Range Status   Specimen Description URINE, CLEAN CATCH  Final   Special Requests NONE  Final   Culture (A)  Final    >=100,000 COLONIES/mL ENTEROCOCCUS FAECALIS REPEATINGT SUSCEPTIBILITY Performed at [facility], [place] [address], [place], [place] [address]    Report Status PENDING  Incomplete         Radiology Studies: [place] Ekg Site Rite  Result Date: 03/23/2019 If Site Rite image not attached, placement could not be confirmed due to current cardiac rhythm.       Scheduled Meds: . amiodarone  400 mg Oral BID  . atorvastatin  80 mg Oral q1800  . insulin aspart  0-15 Units  Subcutaneous TID WC  . pantoprazole  40 mg Oral BID  . senna-docusate  1 tablet Oral BID  . sodium chloride flush  10-40 mL Intracatheter Q12H   Continuous Infusions: . sodium chloride    . milrinone 0.25 mcg/kg/min (03/25/19 0300)  . vancomycin Stopped (03/24/19 1810)     LOS: 9 days    Time spent: 35  minutes.     Elmarie Shiley, MD [facility] Pager [phone] If 7PM-7AM, please contact night-coverage [email or web address] Password [facility] 03/25/2019, 10:22 AM

## 2019-03-25 NOTE — Progress Notes (Signed)
Daily Progress Note   Patient Name: Aaron Mclean       Date: 03/25/2019 DOB: 1951-02-13  Age: 68 y.o. MRN#: [id number] Attending Physician: Elmarie Shiley, MD Primary Care Physician: Marco Collie, MD Admit Date: 03/16/2019  Reason for Consultation/Follow-up: Establishing goals of care  Subjective: Per report and chart review - more alert and oriented than yesterday but still remains weak/tired.  Length of Stay: 9  Current Medications: Scheduled Meds:  . amiodarone  400 mg Oral BID  . atorvastatin  80 mg Oral q1800  . insulin aspart  0-15 Units Subcutaneous TID WC  . pantoprazole  40 mg Oral BID  . senna-docusate  1 tablet Oral BID  . sodium chloride flush  10-40 mL Intracatheter Q12H    Continuous Infusions: . sodium chloride    . milrinone 0.125 mcg/kg/min (03/25/19 1220)  . vancomycin Stopped (03/24/19 1810)    PRN Meds: sodium chloride, acetaminophen **OR** acetaminophen, ondansetron **OR** ondansetron (ZOFRAN) IV, sodium chloride flush, sodium chloride flush, traMADol       Vital Signs: BP 109/88 (BP Location: Left Arm)   Pulse (!) 49   Temp 98 F (36.7 C) (Axillary)   Resp 15   Ht 5\' 9"  (1.753 m)   Wt 76.6 kg   SpO2 98%   BMI 24.94 kg/m  SpO2: SpO2: 98 % O2 Device: O2 Device: Nasal Cannula O2 Flow Rate: O2 Flow Rate (L/min): 2 L/min  Intake/output summary:   Intake/Output Summary (Last 24 hours) at 03/25/2019 1422 Last data filed at 03/25/2019 0815 Gross per 24 hour  Intake 1293.16 ml  Output 2475 ml  Net -1181.84 ml   LBM: Last BM Date: 03/22/19 Baseline Weight: Weight: 81.2 kg Most recent weight: Weight: 76.6 kg       Palliative Assessment/Data: PPS 40%    Flowsheet Rows     Most Recent Value  Intake Tab  Referral Department  Hospitalist  Unit at Time  of Referral  Cardiac/Telemetry Unit  Palliative Care Primary Diagnosis  Cardiac  Date Notified  03/18/19  Palliative Care Type  New Palliative care  Reason for referral  Clarify Goals of Care  Date of Admission  03/16/19  Date first seen by Palliative Care  03/18/19  # of days Palliative referral response time  0 Day(s)  # of days IP prior to Palliative referral  2  Clinical Assessment  Palliative Performance Scale Score  40%  Psychosocial & Spiritual Assessment  Palliative Care Outcomes  Patient/Family meeting held?  Yes  Who was at the meeting?  son  Palliative Care Outcomes  Clarified goals of care, Provided advance care planning, Provided psychosocial or spiritual support, Changed CPR status, Completed durable DNR, Linked to palliative care logitudinal support      Patient Active Problem List   Diagnosis Date Noted  . Acute on chronic systolic heart failure ([place]) 03/24/2019  . AKI (acute kidney injury) ([place]) 03/24/2019  . Anemia 03/24/2019  . Goals of care, counseling/discussion   . Palliative care by specialist   . Pressure injury of skin 03/17/2019  . Diverticulosis of intestine with bleeding 03/16/2019  . Chronic systolic CHF (congestive heart failure), NYHA class 4 ([place]) 12/02/2018  . OSA  on CPAP 12/02/2018  . DM (diabetes mellitus), type 2 with peripheral vascular complications ([place]) 46/56/8127  . CKD (chronic kidney disease) stage 3, GFR 30-59 ml/min (HCC) 12/02/2018  . Essential hypertension 12/02/2018  . Dyslipidemia 12/02/2018  . Benign prostatic hyperplasia with urinary obstruction 09/20/2015  . Family history of malignant neoplasm of prostate 09/20/2015  . Excessive urination at night 09/20/2015    Palliative Care Assessment & Plan   HPI: 68 y.o. male  with past medical history of CKD, T2DM, PVD with toe amputations, a fib, dementia, OSA on CPAP, HTN, HLD, and CHF w/ EF 10-15% admitted on 03/16/2019 with bloody stools. Recently discharged in Dec 2019 after HF  exacerbation, started on amiodarone for PVCs. Patient had a subsequent hospitalization at [facility] (family unsure why - dehydration?), and following this hospitalization he was placed in rehab at [facility] in [place]. Patient initially sent to [facility] again for bloody stools and transferred to [facility] d/t need for GI consult. Throughout admission, hgb has trended. Endoscopy 4/6 revealed multiple gastric and duodenal ulcers, nonbleeding. Hgb now stabilized. PMT consulted for [place].   Assessment: Received update from RN - more interactive than yesterday.   Spoke with son, [name], to provide update. Discussed kidney function. Discussed slight improvement in mental status but remains weak and tired. Discussed continuing current care with watchful waiting. [name] remains hopeful for improvement and hopes patient can go to SNF. However, [name] also asks about end of life - asks if hospice should be involved. We discussed seeing how patient is in coming days. We discussed what hospice care entails. Without improvement, [name] tells me he would want to bring his father home with the support of hospice.   Recommendations/Plan:  Per previous discussion - patient changed to DNR  Continue milrinone - allow another 24 - if improves, SNF with palliative, if decline - home with hospice - discussed hospice care at length with son  Family's main goal is that patient not die in the hospital - would like his "final days" to be at home  Code Status:  DNR  Prognosis:   Unable to determine - at high risk for acute decompensation  Discharge Planning:  To Be Determined  Care plan was discussed with RN and son, [name]  Thank you for allowing the Palliative Medicine Team to assist in the care of this patient.   The above conversation was completed via telephone due to the visitor restrictions during the COVID-19 pandemic. Thorough chart review and discussion with necessary members of the care team was completed as part  of assessment. All issues were discussed and addressed but no physical exam was performed.   Total Time 25 mintues Prolonged Time Billed  no       Greater than 50%  of this time was spent counseling and coordinating care related to the above assessment and plan.  Juel Burrow, DNP, [facility] Palliative Medicine Team Team Phone # [phone]  Pager [phone]

## 2019-03-25 NOTE — Progress Notes (Signed)
Advanced Heart Failure Rounding Note   Subjective:    Says he feels better than yesterday. Very weak. Not interested in getting out of bed.   Denies SOB, orthopnea or PND. No further bleeding.   Co-ox 74% on milrinone 0.25. CVP 3. Creatinine beginning to improve 3.2-> 3.1  Had brief AF/AFL yesterday but now back  in NSR with frequent PVCs/bigeminy.   Objective:   Weight Range:  Vital Signs:   Temp:  [97.4 F (36.3 C)-98.8 F (37.1 C)] 97.7 F (36.5 C) (04/14 0718) Pulse Rate:  [46-107] 49 (04/14 0300) Resp:  [10-17] 14 (04/14 0718) BP: (98-106)/(57-70) 106/57 (04/14 0718) SpO2:  [98 %] 98 % (04/14 0300) Weight:  [76.6 kg] 76.6 kg (04/14 0300) Last BM Date: 03/22/19  Weight change: Filed Weights   03/22/19 0559 03/24/19 0315 03/25/19 0300  Weight: 77.6 kg 76.3 kg 76.6 kg    Intake/Output:   Intake/Output Summary (Last 24 hours) at 03/25/2019 1125 Last data filed at 03/25/2019 0815 Gross per 24 hour  Intake 1533.16 ml  Output 3275 ml  Net -1741.84 ml     Physical Exam: General:  Lying flat in bed. Pale weak appearing . No resp difficulty HEENT: normal Neck: supple. JVP flat. Carotids 2+ bilat; no bruits. No lymphadenopathy or thryomegaly appreciated. Cor: PMI nondisplaced. iregular rate & rhythm. No rubs, gallops or murmurs. Lungs: clear Abdomen: soft, nontender, nondistended. No hepatosplenomegaly. No bruits or masses. Good bowel sounds. Extremities: no cyanosis, clubbing, rash, edema Neuro: alert & orientedx3, cranial nerves grossly intact. moves all 4 extremities w/o difficulty. Affect flat  Telemetry: NSR with frequent PVCs and bigeminy. Personally reviewed   Labs: Basic Metabolic Panel: Recent Labs  Lab 03/21/19 0756 03/22/19 0234 03/23/19 0656 03/24/19 0923 03/25/19 0436  NA 134* 135 135 136 134*  K 3.4* 4.1 4.7 4.2 3.5  CL 101 100 100 102 99  CO2 22 24 20* 21* 24  GLUCOSE 121* 90 104* 134* 144*  BUN 48* 48* 58* 62* 53*  CREATININE 2.33*  2.54* 3.21* 3.22* 3.12*  CALCIUM 8.1* 8.5* 8.6* 8.0* 7.9*  MG  --   --   --   --  1.8    Liver Function Tests: No results for input(s): AST, ALT, ALKPHOS, BILITOT, PROT, ALBUMIN in the last 168 hours. No results for input(s): LIPASE, AMYLASE in the last 168 hours. No results for input(s): AMMONIA in the last 168 hours.  CBC: Recent Labs  Lab 03/19/19 0553  03/19/19 2228 03/20/19 0856 03/21/19 0756 03/22/19 0234 03/25/19 0436  WBC 5.2  --   --  6.2 6.9 8.1 9.1  HGB 6.6*   < > 10.2* 9.9* 10.1* 10.6* 9.2*  HCT 20.2*   < > 28.9* 28.3* 27.9* 29.4* 24.6*  MCV 81.8  --   --  83.5 83.5 83.8 88.2  PLT 109*  --   --  117* 145* 181 145*   < > = values in this interval not displayed.    Cardiac Enzymes: No results for input(s): CKTOTAL, CKMB, CKMBINDEX, TROPONINI in the last 168 hours.  BNP: BNP (last 3 results) No results for input(s): BNP in the last 8760 hours.  ProBNP (last 3 results) No results for input(s): PROBNP in the last 8760 hours.    Other results:  Imaging: [place] Ekg Site Rite  Result Date: 03/23/2019 If Site Rite image not attached, placement could not be confirmed due to current cardiac rhythm.     Medications:     Scheduled  Medications: . amiodarone  400 mg Oral BID  . atorvastatin  80 mg Oral q1800  . insulin aspart  0-15 Units Subcutaneous TID WC  . pantoprazole  40 mg Oral BID  . senna-docusate  1 tablet Oral BID  . sodium chloride flush  10-40 mL Intracatheter Q12H     Infusions: . sodium chloride    . milrinone 0.25 mcg/kg/min (03/25/19 1123)  . vancomycin Stopped (03/24/19 1810)     PRN Medications:  sodium chloride, acetaminophen **OR** acetaminophen, ondansetron **OR** ondansetron (ZOFRAN) IV, sodium chloride flush, sodium chloride flush, traMADol   Assessment:   [name] is a 68 year old with a history of DM2, neuropathy, HTN, CKD 3,OSA,PVCs, CAD, and hyperlipidemia. He had newly diagnosed mod to severe MR and acute systolic HF  with EF 64-40 diagnosed in 11/2018.    Admitted from [facility] with GI bleed.    Plan/Discussion:     1. A/C Systolic Heart Failure, mixed ICM/NICM   - ECHO on 03/16/19 EF 15%.  - Now on milrinone empirically. Co-ox 74% on milrinone 0.25.  - Despite low EF, I don't think severe clinical HF is major player here. Will begin milrinone wean today.  - CVP 3-4. Hold diuretics - Off b-blocker for now with milrinone - No dig, losartan, or spiro due to elevated creatinine.  - He is not a candidate for aggressive HF management. Not candidate for home inotropes of advanced therapies   2. AKI on CKD Stage III -Creatinine trended up from 2.2--> 3.2. now beginning to improve -> 3.1 Suspect ATN from hypotension in setting of GIB. +/- cardio renal syndrome. Continue milrinone for now.  - CVP 3-4 . Hold diureitcs - Renal US was negative for hydronephrosis.  - Nephrology following  3. PVCs - Has extremely high burden. May be contributing to cardiomyopathy  - Will switch amio to IV for now and see if we can suppress - Keep K> 4.0 Mg >2.0  4. PAF/PAFL - this is new onset on 4/13. It was transient in setting of milrinone.  - continue amio - wean milrinone - No AC with acute GI bleed   5. Symptomatic anemia due to acute Upper GI Bleed - GI consulted. Received total of 2UPRBCs.  - S/P EGD on 4/6 with gastritis and duodenal ulcers. 1 with APC.  - Hgb drifting back down. No overt bleeding - Primary team mangaging - On PPI.   6. CAD  - LHC 2019 with :Prox RCA to Mid RCA lesion is 80% stenosed. Mid RCA lesion is 60% stenosed.Dist RCA lesion is 20% stenosed with 20% stenosed side branch in Post Atrio. Prox Cx lesion is 30% stenosed. Mid Cx lesion is 100% stenosed. Ost 2nd Mrg to 2nd Mrg lesion is 70% stenosed. Ost LM lesion is 30% stenosed. Prox LAD lesion is 30% stenosed. Mid LAD to Dist LAD lesion is 60% stenosed.  - No evidence ACS - On high dose statin. No asa with GI bleed.   7.  DNR - He is very debilitated and apparently severely depressed - He is refusing to get out of bed,  - We discussed EOL issues and his will to live. He seems to have very poor QOL - Palliative Care following.    Plans to return to [facility]. May need Hospice soon.     Length of Stay: 9   Glori Bickers MD 03/25/2019, 11:25 AM  Advanced Heart Failure Team Pager [phone] (M-F; 7a - 4p)  Please contact [facility] Cardiology  for night-coverage after hours (4p -7a ) and weekends on [email or web address]

## 2019-03-25 NOTE — Progress Notes (Signed)
Patient ID: Aaron Mclean, male   DOB: 04-20-1951, 68 y.o.   MRN: [id number] [facility] Progress Note   Assessment/ Plan:   1. Acute kidney Injury: Suspected to be initially hemodynamically mediated with acute blood loss anemia in the setting of ongoing ARB/Lasix/Aldactone use and likely additional injury from CHF exacerbation.  With good urine output overnight and some improvement of BUN/creatinine noted.  I appreciate the input from Dr. Haroldine Laws yesterday regarding his CVP and concur with the plan is to hold diuretics for now. 2.  Acute exacerbation of congestive heart failure: Systolic dysfunction with EF 10 to 15%.  Remains on milrinone for inotropic support, restart diuretics when indicated based on weight/CVP. 3.  Acute blood loss anemia secondary to GI bleed from duodenal ulcers.  Status post cauterization by gastroenterology with seemingly stable H/H-labs pending from this morning. 4.  Anion gap metabolic acidosis: Secondary to acute kidney injury/CHF exacerbation, improvement noted with labs this morning. 5.  Atrial fibrillation: Intermittent bradycardia with normal ventricular response at this time.  Subjective:   Reports to be feeling tired this morning after not getting restful sleep overnight "it was too hot".  Expresses some problems with restricting fluid intake.   Objective:   BP (!) 106/57 (BP Location: Left Arm)   Pulse (!) 49   Temp 97.7 F (36.5 C)   Resp 14   Ht 5\' 9"  (1.753 m)   Wt 76.6 kg   SpO2 98%   BMI 24.94 kg/m   Intake/Output Summary (Last 24 hours) at 03/25/2019 0748 Last data filed at 03/25/2019 0546 Gross per 24 hour  Intake 1418.98 ml  Output 2850 ml  Net -1431.02 ml   Weight change: 0.3 kg  Physical Exam: Gen: Appears to be comfortable resting in bed CVS: Irregularly irregular-tachycardic when seen 112, S1 and S2 normal Resp: Diminished breath sounds over bases, no distinct rales or rhonchi Abd: Soft, flat, nontender Ext: No lower  extremity edema  Imaging: [place] Ekg Site Rite  Result Date: 03/23/2019 If Site Rite image not attached, placement could not be confirmed due to current cardiac rhythm.   Labs: BMET Recent Labs  Lab 03/19/19 0410 03/20/19 0856 03/21/19 0756 03/22/19 0234 03/23/19 0656 03/24/19 0923 03/25/19 0436  NA 133* 135 134* 135 135 136 134*  K 3.1* 2.9* 3.4* 4.1 4.7 4.2 3.5  CL 100 101 101 100 100 102 99  CO2 22 23 22 24  20* 21* 24  GLUCOSE 73 105* 121* 90 104* 134* 144*  BUN 63* 51* 48* 48* 58* 62* 53*  CREATININE 2.12* 2.14* 2.33* 2.54* 3.21* 3.22* 3.12*  CALCIUM 7.6* 7.9* 8.1* 8.5* 8.6* 8.0* 7.9*   CBC Recent Labs  Lab 03/20/19 0856 03/21/19 0756 03/22/19 0234 03/25/19 0436  WBC 6.2 6.9 8.1 9.1  HGB 9.9* 10.1* 10.6* 9.2*  HCT 28.3* 27.9* 29.4* 24.6*  MCV 83.5 83.5 83.8 88.2  PLT 117* 145* 181 145*    Medications:    . amiodarone  400 mg Oral BID  . atorvastatin  80 mg Oral q1800  . insulin aspart  0-15 Units Subcutaneous TID WC  . pantoprazole  40 mg Oral BID  . senna-docusate  1 tablet Oral BID  . sodium chloride flush  10-40 mL Intracatheter Q12H   Elmarie Shiley, MD 03/25/2019, 7:48 AM

## 2019-03-26 DIAGNOSIS — I10 Essential (primary) hypertension: Secondary | ICD-10-CM

## 2019-03-26 DIAGNOSIS — E1151 Type 2 diabetes mellitus with diabetic peripheral angiopathy without gangrene: Secondary | ICD-10-CM

## 2019-03-26 DIAGNOSIS — I493 Ventricular premature depolarization: Secondary | ICD-10-CM

## 2019-03-26 LAB — BASIC METABOLIC PANEL
Anion gap: 12 (ref 5–15)
BUN: 52 mg/dL — ABNORMAL HIGH (ref 8–23)
CO2: 22 mmol/L (ref 22–32)
Calcium: 7.9 mg/dL — ABNORMAL LOW (ref 8.9–10.3)
Chloride: 100 mmol/L (ref 98–111)
Creatinine, Ser: 2.86 mg/dL — ABNORMAL HIGH (ref 0.61–1.24)
GFR calc Af Amer: 25 mL/min — ABNORMAL LOW (ref >60)
GFR calc non Af Amer: 21 mL/min — ABNORMAL LOW (ref >60)
Glucose, Bld: 119 mg/dL — ABNORMAL HIGH (ref 70–99)
Potassium: 3.8 mmol/L (ref 3.5–5.1)
Sodium: 134 mmol/L — ABNORMAL LOW (ref 135–145)

## 2019-03-26 LAB — URINE CULTURE: Culture: >=100000 — AB

## 2019-03-26 LAB — CBC
HCT: 29.3 % — ABNORMAL LOW (ref 39.0–52.0)
Hemoglobin: 9.6 g/dL — ABNORMAL LOW (ref 13.0–17.0)
MCH: 28.7 pg (ref 26.0–34.0)
MCHC: 32.8 g/dL (ref 30.0–36.0)
MCV: 87.7 fL (ref 80.0–100.0)
Platelets: 150 10*3/uL (ref 150–400)
RBC: 3.34 MIL/uL — ABNORMAL LOW (ref 4.22–5.81)
RDW: 18.4 % — ABNORMAL HIGH (ref 11.5–15.5)
WBC: 9.2 10*3/uL (ref 4.0–10.5)
nRBC: 0 % (ref 0.0–0.2)

## 2019-03-26 LAB — GLUCOSE, CAPILLARY
Glucose-Capillary: 98 mg/dL (ref 70–99)
Glucose-Capillary: 174 mg/dL — ABNORMAL HIGH (ref 70–99)
Glucose-Capillary: 124 mg/dL — ABNORMAL HIGH (ref 70–99)
Glucose-Capillary: 121 mg/dL — ABNORMAL HIGH (ref 70–99)
Glucose-Capillary: 69 mg/dL — ABNORMAL LOW (ref 70–99)

## 2019-03-26 LAB — COOXEMETRY PANEL
Carboxyhemoglobin: 1.2 % (ref 0.5–1.5)
Methemoglobin: 1.8 % — ABNORMAL HIGH (ref 0.0–1.5)
O2 Saturation: 57.2 %
Total hemoglobin: 9.9 g/dL — ABNORMAL LOW (ref 12.0–16.0)

## 2019-03-26 MED ORDER — PANTOPRAZOLE SODIUM 40 MG PO TBEC: 40.0000 mg | DELAYED_RELEASE_TABLET | Freq: Two times a day (BID) | ORAL | 0 refills | Status: DC

## 2019-03-26 MED ORDER — AMIODARONE HCL 200 MG PO TABS: 200.0000 mg | ORAL_TABLET | Freq: Every day | ORAL | 0 refills | Status: DC

## 2019-03-26 MED ORDER — MEXILETINE HCL 150 MG PO CAPS
150.0000 mg | ORAL_CAPSULE | Freq: Two times a day (BID) | ORAL | Status: DC
  Administered 2019-03-26 – 2019-03-27 (×3): 150 mg via ORAL
  Filled 2019-03-26 (×3): qty 1

## 2019-03-26 MED ORDER — MEXILETINE HCL 150 MG PO CAPS: 150.0000 mg | ORAL_CAPSULE | Freq: Two times a day (BID) | ORAL | 0 refills | Status: DC

## 2019-03-26 MED ORDER — AMOXICILLIN 500 MG PO CAPS
500.0000 mg | ORAL_CAPSULE | Freq: Two times a day (BID) | ORAL | Status: DC
  Administered 2019-03-26 – 2019-03-27 (×3): 500 mg via ORAL
  Filled 2019-03-26 (×4): qty 1

## 2019-03-26 MED ORDER — AMOXICILLIN 500 MG PO CAPS: 500.0000 mg | ORAL_CAPSULE | Freq: Two times a day (BID) | ORAL | 0 refills | Status: DC

## 2019-03-26 MED ORDER — ATORVASTATIN CALCIUM 80 MG PO TABS: 80.0000 mg | ORAL_TABLET | Freq: Every day | ORAL | 0 refills | Status: DC

## 2019-03-26 MED ORDER — AMIODARONE HCL 200 MG PO TABS
200.0000 mg | ORAL_TABLET | Freq: Two times a day (BID) | ORAL | Status: DC
  Administered 2019-03-26 – 2019-03-27 (×2): 200 mg via ORAL
  Filled 2019-03-26 (×2): qty 1

## 2019-03-26 MED ORDER — FUROSEMIDE 40 MG PO TABS: 40.0000 mg | ORAL_TABLET | Freq: Every day | ORAL | 0 refills | Status: DC

## 2019-03-26 MED ORDER — ENSURE ENLIVE PO LIQD
237.0000 mL | Freq: Three times a day (TID) | ORAL | Status: DC
  Administered 2019-03-26 – 2019-03-27 (×3): 237 mL via ORAL

## 2019-03-26 NOTE — Progress Notes (Signed)
Daily Progress Note   Patient Name: Aaron Mclean       Date: 03/26/2019 DOB: [birth year]  Age: 68 y.o. MRN#: [id number] Attending Physician: Damita Lack, MD Primary Care Physician: Marco Collie, MD Admit Date: 03/16/2019  Reason for Consultation/Follow-up: Establishing goals of care  Subjective: Per report and chart review - patient much weaker today, remains confused. Also, patient has told several providers he needs to go home because his nephew passed away. Per his son, this is not accurate and should not affect his discharge.   Length of Stay: 10  Current Medications: Scheduled Meds:  . amiodarone  200 mg Oral BID  . amoxicillin  500 mg Oral Q12H  . atorvastatin  80 mg Oral q1800  . feeding supplement (ENSURE ENLIVE)  237 mL Oral TID BM  . insulin aspart  0-15 Units Subcutaneous TID WC  . mexiletine  150 mg Oral Q12H  . pantoprazole  40 mg Oral BID  . senna-docusate  1 tablet Oral BID  . sodium chloride flush  10-40 mL Intracatheter Q12H    Continuous Infusions: . sodium chloride      PRN Meds: sodium chloride, acetaminophen **OR** acetaminophen, ondansetron **OR** ondansetron (ZOFRAN) IV, sodium chloride flush, sodium chloride flush, traMADol       Vital Signs: BP 111/77 (BP Location: Left Arm)   Pulse (!) 119   Temp (!) 97.2 F (36.2 C) (Oral)   Resp 19   Ht 5\' 9"  (1.753 m)   Wt 81.1 kg   SpO2 98%   BMI 26.40 kg/m  SpO2: SpO2: 98 % O2 Device: O2 Device: Nasal Cannula O2 Flow Rate: O2 Flow Rate (L/min): 2 L/min  Intake/output summary:   Intake/Output Summary (Last 24 hours) at 03/26/2019 1836 Last data filed at 03/26/2019 1732 Gross per 24 hour  Intake 18755.43 ml  Output 1301 ml  Net 17454.43 ml   LBM: Last BM Date: 03/22/19 Baseline Weight: Weight: 81.2 kg  Most recent weight: Weight: 81.1 kg       Palliative Assessment/Data: PPS 40%    Flowsheet Rows     Most Recent Value  Intake Tab  Referral Department  Hospitalist  Unit at Time of Referral  Cardiac/Telemetry Unit  Palliative Care Primary Diagnosis  Cardiac  Date Notified  03/18/19  Palliative Care Type  New Palliative care  Reason for referral  Clarify Goals of Care  Date of Admission  03/16/19  Date first seen by Palliative Care  03/18/19  # of days Palliative referral response time  0 Day(s)  # of days IP prior to Palliative referral  2  Clinical Assessment  Palliative Performance Scale Score  40%  Psychosocial & Spiritual Assessment  Palliative Care Outcomes  Patient/Family meeting held?  Yes  Who was at the meeting?  son  Palliative Care Outcomes  Clarified goals of care, Provided advance care planning, Provided psychosocial or spiritual support, Changed CPR status, Completed durable DNR, Linked to palliative care logitudinal support      Patient Active Problem List   Diagnosis Date Noted  . Frequent PVCs   . Acute on chronic systolic heart failure ([place]) 03/24/2019  . AKI (acute kidney injury) ([place]) 03/24/2019  .  Anemia 03/24/2019  . Goals of care, counseling/discussion   . Palliative care by specialist   . Pressure injury of skin 03/17/2019  . Diverticulosis of intestine with bleeding 03/16/2019  . Chronic systolic CHF (congestive heart failure), NYHA class 4 ([place]) 12/02/2018  . OSA on CPAP 12/02/2018  . DM (diabetes mellitus), type 2 with peripheral vascular complications ([place]) 74/07/1447  . CKD (chronic kidney disease) stage 3, GFR 30-59 ml/min (H[facility]) 12/02/2018  . Essential hypertension 12/02/2018  . Dyslipidemia 12/02/2018  . Benign prostatic hyperplasia with urinary obstruction 09/20/2015  . Family history of malignant neoplasm of prostate 09/20/2015  . Excessive urination at night 09/20/2015    Palliative Care Assessment & Plan   HPI: 68 y.o. male   with past medical history of CKD, T2DM, PVD with toe amputations, a fib, dementia, OSA on CPAP, HTN, HLD, and CHF w/ EF 10-15% admitted on 03/16/2019 with bloody stools. Recently discharged in Dec 2019 after HF exacerbation, started on amiodarone for PVCs. Patient had a subsequent hospitalization at [facility] (family unsure why - dehydration?), and following this hospitalization he was placed in rehab at [facility] in [place]. Patient initially sent to [facility] again for bloody stools and transferred to [facility] d/t need for GI consult. Throughout admission, hgb has trended. Endoscopy 4/6 revealed multiple gastric and duodenal ulcers, nonbleeding. Hgb now stabilized. PMT consulted for [place].   Assessment: Received update from RN - increased weakness and lethargy. Remains confused. Patient has told several providers he needs to go home as his nephew passed away - per his son, this is not true and should not affect discharge. Son tells me when he spoke with patient the patient told him he was at work but was ready to quit this job.   Spoke with son, [name], to provide update. Discussed improving kidney function. Discussed that milrinone has been discontinued. Discussed concerns that patient feels much worse - weaker, tired. Discussed altered mental status. [name] is agreeable to either the patient coming home with him and support of hospice or going to rehab with palliative to follow peripherally. He wants to do whatever is in best interest of patient. We discussed pros and cons of both of these options. [name] is hopeful patient will be able to tolerate some rehab before going home. We discussed if patient declines at rehab he could go home with hospice from there. Discussed following up tomorrow, seeing how patient is doing off of milrinone drip and then deciding how to proceed. Discussed with Dr. Reesa Chew who agrees.   Recommendations/Plan:  Per previous discussion - patient changed to DNR  Family's main goal is that  patient not die in the hospital - would like his "final days" to be at home  Family to decide home w/hospice vs rehab with palliative - want to see how he looks tomorrow off of milrinone  Code Status:  DNR  Prognosis:   Unable to determine - at high risk for acute decompensation  Discharge Planning:  To Be Determined  Care plan was discussed with RN and son, [name], Dr. Reesa Chew  Thank you for allowing the Palliative Medicine Team to assist in the care of this patient.   The above conversation was completed via telephone due to the visitor restrictions during the COVID-19 pandemic. Thorough chart review and discussion with necessary members of the care team was completed as part of assessment. All issues were discussed and addressed but no physical exam was performed.   Total Time 35 mintues Prolonged Time Billed  no       Greater than 50%  of this time was spent counseling and coordinating care related to the above assessment and plan.  Juel Burrow, DNP, [facility] Palliative Medicine Team Team Phone # [phone]  Pager [phone]

## 2019-03-26 NOTE — Progress Notes (Signed)
PROGRESS NOTE    Aaron Mclean  YQI:[id number] DOB: [birth year] DOA: 03/16/2019 PCP: Marco Collie, MD   Brief Narrative:  68 year old with past medical history of uncontrolled diabetes mellitus type 2, peripheral vascular disease status post toe amputation, atrial fibrillation, CKD stage III, systolic CHF with ejection fraction 10-15%, dementia, hyperlipidemia, hypertension initially came to [facility] for evaluation of dark stools but due to lack of GI coverage he was transferred here for further care.  Hemoglobin trended downwards to 8.5.  Endoscopy showed multiple gastric and duodenal nonbleeding ulcer.  He received 2 units of PRBC transfusion and was started on PPI twice daily. Hospital course was complicated by worsening of renal function and CHF exacerbation.  Concerns of low cardiac output.  CHF team was consulted who recommended starting patient on milrinone.  Nephrology team is also following the patient.  Palliative care team was consulted.  Per heart failure team patient is not a candidate for aggressive management or home inotrope therapy.   Assessment & Plan:   Principal Problem:   Diverticulosis of intestine with bleeding Active Problems:   Benign prostatic hyperplasia with urinary obstruction   Chronic systolic CHF (congestive heart failure), NYHA class 4 (HCC)   OSA on CPAP   DM (diabetes mellitus), type 2 with peripheral vascular complications (HCC)   CKD (chronic kidney disease) stage 3, GFR 30-59 ml/min (HCC)   Essential hypertension   Dyslipidemia   Pressure injury of skin   Goals of care, counseling/discussion   Palliative care by specialist   Acute on chronic systolic heart failure ([place])   AKI (acute kidney injury) ([place])   Anemia  Acute on chronic congestive heart failure with reduced ejection fraction, 15% -Echocardiogram 03/16/2019 shows ejection fraction 15%.  Low CVP therefore holding off on diuretics.  Holding nephrotoxic drugs including ACE/arb and  Aldactone.  Overall management per CHF team.  Overall does have poor prognosis due to limited treatment options.  Palliative care team recommends home hospice or have palliative care team follow at [facility]. - Milrinone drip turned off.  Further medication recommendations per heart failure team.  Acute kidney injury on CKD stage III -Suspect cardiorenal syndrome versus ATN.  Renal function has slightly improved to 2.86.  Stabilized.  Other electrolytes are okay.  Renal ultrasound is negative for acute pathology.  [place] nephrology team input.  Premature ventricular contraction - Monitor electrolytes closely.  Amiodarone  Urinary tract infection with Enterococcus faecalis -Sensitivities available today.  Will discontinue vancomycin in the setting of acute kidney injury and transition to amoxicillin 500 mg.  Symptomatic anemia due to acute blood loss Upper GI bleed, stabilized -Status post endoscopy showing nonbleeding gastritis and duodenal ulcer.  Status post APC.  Continue PPI. -Status post 2 units of PRBC transfusion.  History of coronary artery disease, multivessel - Currently remains chest pain-free.  Continue high-dose statin.  Not on anticoagulation including aspirin due to GI bleed.   DVT prophylaxis: SCDs Code Status: DNR Family Communication: None at bedside Disposition Plan: Maintain hospital stay until symptomatically he is feeling better  Consultants:   Heart failure  Palliative care  Nephrology  Procedures:   None  Antimicrobials:   Vancomycin stopped 4/15  Amoxicillin 4/15   Subjective: This morning patient stated he was feeling well and wished to go home as his nephew passed away yesterday but after returning milrinone drip he feels fatigued and is agreeable to stay another day with eventual plans to discharge to [facility].  He did not get much rest  last night since he heard the news about his nephew passing away.  Review of Systems Otherwise  negative except as per HPI, including: General: Denies fever, chills, night sweats or unintended weight loss. Resp: Denies cough, wheezing Cardiac: Denies chest pain, palpitations, orthopnea, paroxysmal nocturnal dyspnea. GI: Denies abdominal pain, nausea, vomiting, diarrhea or constipation GU: Denies dysuria, frequency, hesitancy or incontinence MS: Denies muscle aches, joint pain or swelling Neuro: Denies headache, neurologic deficits (focal weakness, numbness, tingling), abnormal gait Psych: Denies anxiety, depression, SI/HI/AVH Skin: Denies new rashes or lesions ID: Denies sick contacts, exotic exposures, travel  Objective: Vitals:   03/26/19 0435 03/26/19 0500 03/26/19 0827 03/26/19 1113  BP: 96/62  98/73 (!) 102/57  Pulse: (!) 119  (!) 105 (!) 43  Resp: 14  (!) 21 13  Temp: 97.8 F (36.6 C)  97.9 F (36.6 C) (!) 97.4 F (36.3 C)  TempSrc: Oral  Oral Oral  SpO2: 98%  99% 97%  Weight:  81.1 kg    Height:        Intake/Output Summary (Last 24 hours) at 03/26/2019 1307 Last data filed at 03/26/2019 1000 Gross per 24 hour  Intake 18395.43 ml  Output 1 ml  Net 18394.43 ml   Filed Weights   03/24/19 0315 03/25/19 0300 03/26/19 0500  Weight: 76.3 kg 76.6 kg 81.1 kg    Examination:  General exam: Appears calm and comfortable, 2 L nasal cannula. Respiratory system: Bibasilar crackles Cardiovascular system: S1 & S2 heard, RRR. No JVD, murmurs, rubs, gallops or clicks. No pedal edema. Gastrointestinal system: Abdomen is nondistended, soft and nontender. No organomegaly or masses felt. Normal bowel sounds heard. Central nervous system: Alert and oriented. No focal neurological deficits. Extremities: Symmetric 5 x 5 power. Skin: No rashes, lesions or ulcers Psychiatry: Judgement and insight appear normal. Mood & affect appropriate.  Right upper extremity PICC line in place   Data Reviewed:   CBC: Recent Labs  Lab 03/20/19 0856 03/21/19 0756 03/22/19 0234  03/25/19 0436 03/26/19 0325  WBC 6.2 6.9 8.1 9.1 9.2  HGB 9.9* 10.1* 10.6* 9.2* 9.6*  HCT 28.3* 27.9* 29.4* 24.6* 29.3*  MCV 83.5 83.5 83.8 88.2 87.7  PLT 117* 145* 181 145* 161   Basic Metabolic Panel: Recent Labs  Lab 03/22/19 0234 03/23/19 0656 03/24/19 0923 03/25/19 0436 03/26/19 0325  NA 135 135 136 134* 134*  K 4.1 4.7 4.2 3.5 3.8  CL 100 100 102 99 100  CO2 24 20* 21* 24 22  GLUCOSE 90 104* 134* 144* 119*  BUN 48* 58* 62* 53* 52*  CREATININE 2.54* 3.21* 3.22* 3.12* 2.86*  CALCIUM 8.5* 8.6* 8.0* 7.9* 7.9*  MG  --   --   --  1.8  --    GFR: Estimated Creatinine Clearance: 24.4 mL/min (A) (by C-G formula based on SCr of 2.86 mg/dL (H)). Liver Function Tests: No results for input(s): AST, ALT, ALKPHOS, BILITOT, PROT, ALBUMIN in the last 168 hours. No results for input(s): LIPASE, AMYLASE in the last 168 hours. No results for input(s): AMMONIA in the last 168 hours. Coagulation Profile: No results for input(s): INR, PROTIME in the last 168 hours. Cardiac Enzymes: No results for input(s): CKTOTAL, CKMB, CKMBINDEX, TROPONINI in the last 168 hours. BNP (last 3 results) No results for input(s): PROBNP in the last 8760 hours. HbA1C: No results for input(s): HGBA1C in the last 72 hours. CBG: Recent Labs  Lab 03/25/19 1121 03/25/19 1747 03/25/19 2140 03/26/19 0707 03/26/19 1111  GLUCAP 87 130*  130* 98 174*   Lipid Profile: No results for input(s): CHOL, HDL, LDLCALC, TRIG, CHOLHDL, LDLDIRECT in the last 72 hours. Thyroid Function Tests: No results for input(s): TSH, T4TOTAL, FREET4, T3FREE, THYROIDAB in the last 72 hours. Anemia Panel: No results for input(s): VITAMINB12, FOLATE, FERRITIN, TIBC, IRON, RETICCTPCT in the last 72 hours. Sepsis Labs: No results for input(s): PROCALCITON, LATICACIDVEN in the last 168 hours.  Recent Results (from the past 240 hour(s))  MRSA PCR Screening     Status: None   Collection Time: 03/16/19  3:46 PM  Result Value Ref Range  Status   MRSA by PCR NEGATIVE NEGATIVE Final    Comment:        The GeneXpert MRSA Assay (FDA approved for NASAL specimens only), is one component of a comprehensive MRSA colonization surveillance program. It is not intended to diagnose MRSA infection nor to guide or monitor treatment for MRSA infections. Performed at [facility], [place] [address] [place], [place] [address]   Culture, Urine     Status: Abnormal   Collection Time: 03/23/19  3:00 PM  Result Value Ref Range Status   Specimen Description URINE, CLEAN CATCH  Final   Special Requests   Final    NONE Performed at [facility], [place] [address] [place], [place] [address]    Culture >=100,000 COLONIES/mL ENTEROCOCCUS FAECALIS (A)  Final   Report Status 03/26/2019 FINAL  Final   Organism ID, Bacteria ENTEROCOCCUS FAECALIS (A)  Final      Susceptibility   Enterococcus faecalis - MIC*    AMPICILLIN <=2 SENSITIVE Sensitive     LEVOFLOXACIN >=8 RESISTANT Resistant     NITROFURANTOIN <=16 SENSITIVE Sensitive     VANCOMYCIN 1 SENSITIVE Sensitive     * >=100,000 COLONIES/mL ENTEROCOCCUS FAECALIS         Radiology Studies: No results found.      Scheduled Meds:  amiodarone  400 mg Oral BID   amoxicillin  500 mg Oral Q12H   atorvastatin  80 mg Oral q1800   feeding supplement (ENSURE ENLIVE)  237 mL Oral TID BM   insulin aspart  0-15 Units Subcutaneous TID WC   pantoprazole  40 mg Oral BID   senna-docusate  1 tablet Oral BID   sodium chloride flush  10-40 mL Intracatheter Q12H   Continuous Infusions:  sodium chloride       LOS: 10 days   Time spent= 35 mins    Hunt Zajicek Arsenio Loader, MD [facility]  If 7PM-7AM, please contact night-coverage [email or web address] 03/26/2019, 1:07 PM

## 2019-03-26 NOTE — Progress Notes (Signed)
Advanced Heart Failure Rounding Note   Subjective:     Yesterday milrinone was cut back to 0.125 mcg. Co-ox 57%. He has been off diuretics. CVP 3.   Creatinine trending down 3.1>2.8. In NSR but with 20 PVCs per minute  Denies SOB, orthopnea or PND. He is requesting discharge today because his nephew passed away.     Objective:   Weight Range:  Vital Signs:   Temp:  [97.4 F (36.3 C)-98.5 F (36.9 C)] 97.9 F (36.6 C) (04/15 0827) Pulse Rate:  [105-119] 105 (04/15 0827) Resp:  [14-21] 21 (04/15 0827) BP: (94-109)/(61-88) 98/73 (04/15 0827) SpO2:  [95 %-100 %] 99 % (04/15 0827) Weight:  [81.1 kg] 81.1 kg (04/15 0500) Last BM Date: 03/22/19  Weight change: Filed Weights   03/24/19 0315 03/25/19 0300 03/26/19 0500  Weight: 76.3 kg 76.6 kg 81.1 kg    Intake/Output:   Intake/Output Summary (Last 24 hours) at 03/26/2019 1028 Last data filed at 03/26/2019 0400 Gross per 24 hour  Intake 18395.43 ml  Output -  Net 18395.43 ml     Physical Exam: CVP 3  General:  Pale lying flat in bed No resp difficulty HEENT: normal Neck: supple. no JVD. Carotids 2+ bilat; no bruits. No lymphadenopathy or thryomegaly appreciated. Cor: PMI laterally displaced. Mildly irregular rate & rhythm. No rubs, gallops or murmurs. Lungs: clear Abdomen: soft, nontender, nondistended. No hepatosplenomegaly. No bruits or masses. Good bowel sounds. Extremities: no cyanosis, clubbing, rash, edema Neuro: alert & orientedx3, cranial nerves grossly intact. moves all 4 extremities w/o difficulty. Affect pleasant   Telemetry:  Sinus rhythm with PVCs   Labs: Basic Metabolic Panel: Recent Labs  Lab 03/22/19 0234 03/23/19 0656 03/24/19 0923 03/25/19 0436 03/26/19 0325  NA 135 135 136 134* 134*  K 4.1 4.7 4.2 3.5 3.8  CL 100 100 102 99 100  CO2 24 20* 21* 24 22  GLUCOSE 90 104* 134* 144* 119*  BUN 48* 58* 62* 53* 52*  CREATININE 2.54* 3.21* 3.22* 3.12* 2.86*  CALCIUM 8.5* 8.6* 8.0* 7.9* 7.9*   MG  --   --   --  1.8  --     Liver Function Tests: No results for input(s): AST, ALT, ALKPHOS, BILITOT, PROT, ALBUMIN in the last 168 hours. No results for input(s): LIPASE, AMYLASE in the last 168 hours. No results for input(s): AMMONIA in the last 168 hours.  CBC: Recent Labs  Lab 03/20/19 0856 03/21/19 0756 03/22/19 0234 03/25/19 0436 03/26/19 0325  WBC 6.2 6.9 8.1 9.1 9.2  HGB 9.9* 10.1* 10.6* 9.2* 9.6*  HCT 28.3* 27.9* 29.4* 24.6* 29.3*  MCV 83.5 83.5 83.8 88.2 87.7  PLT 117* 145* 181 145* 150    Cardiac Enzymes: No results for input(s): CKTOTAL, CKMB, CKMBINDEX, TROPONINI in the last 168 hours.  BNP: BNP (last 3 results) No results for input(s): BNP in the last 8760 hours.  ProBNP (last 3 results) No results for input(s): PROBNP in the last 8760 hours.    Other results:  Imaging: No results found.   Medications:     Scheduled Medications: . amiodarone  400 mg Oral BID  . amoxicillin  500 mg Oral Q12H  . atorvastatin  80 mg Oral q1800  . feeding supplement (ENSURE ENLIVE)  237 mL Oral TID BM  . insulin aspart  0-15 Units Subcutaneous TID WC  . pantoprazole  40 mg Oral BID  . senna-docusate  1 tablet Oral BID  . sodium chloride flush  10-40  mL Intracatheter Q12H    Infusions: . sodium chloride      PRN Medications: sodium chloride, acetaminophen **OR** acetaminophen, ondansetron **OR** ondansetron (ZOFRAN) IV, sodium chloride flush, sodium chloride flush, traMADol   Assessment:   [facility] is a 68 year old with a history of DM2, neuropathy, HTN, CKD 3,OSA,PVCs, CAD, and hyperlipidemia. He had newly diagnosed mod to severe [facility] and acute systolic HF with EF 82-99 diagnosed in 11/2018.    Admitted from [facility] with GI bleed.    Plan/Discussion:     1. A/C Systolic Heart Failure, mixed ICM/NICM   - ECHO on 03/16/19 EF 15%.  - Stop milrinone with plan for discharge today.  - CVp 3. Continue to hold diuretics.  -  Off b-blocker for  now with milrinone - No dig, losartan, or spiro due to elevated creatinine.  - He is not a candidate for aggressive HF management. Not candidate for home inotropes of advanced therapies   2. AKI on CKD Stage III -Creatinine trended up from 2.2--> 3.2. now beginning to improve -> 2.86 -Suspect ATN from hypotension in setting of GIB. +/- cardio renal syndrome. Continue milrinone for now.  - CVP 3 today. Continue hold diuretics.  - Renal US was negative for hydronephrosis.  - Nephrology following  3. PVCs - Has extremely high burden. May be contributing to cardiomyopathy  - Amio doesn't seem to suppress PVCs well still with 20 PVCs per minute - Add mexilitene 150 bid - Keep K> 4.0 Mg >2.0  4. PAF/PAFL - this is new onset on 4/13. It was transient in setting of milrinone.  - continue amio - wean milrinone - No AC with acute GI bleed   5. Symptomatic anemia due to acute Upper GI Bleed - GI consulted. Received total of 2UPRBCs.  - S/P EGD on 4/6 with gastritis and duodenal ulcers. 1 with APC.  - Hgb stable today.  - Primary team mangaging - On PPI.   6. CAD  - LHC 2019 with :Prox RCA to Mid RCA lesion is 80% stenosed. Mid RCA lesion is 60% stenosed.Dist RCA lesion is 20% stenosed with 20% stenosed side branch in Post Atrio. Prox Cx lesion is 30% stenosed. Mid Cx lesion is 100% stenosed. Ost 2nd Mrg to 2nd Mrg lesion is 70% stenosed. Ost LM lesion is 30% stenosed. Prox LAD lesion is 30% stenosed. Mid LAD to Dist LAD lesion is 60% stenosed.  - No evidence ACS - On high dose statin. No asa with GI bleed.   7. DNR - He is very debilitated and apparently severely depressed - We discussed EOL issues and his will to live. He seems to have very poor QOL - Palliative Care following. Would set up with Hospice.    After a long discussion patient is agreeable to stay and pursue placement at SNF. He realizes that he would likely not do well at home.    Length of Stay: 10  Glori Bickers, MD  12:00 PM   Advanced Heart Failure Team Pager [phone] (M-F; 7a - 4p)  Please contact [facility] for night-coverage after hours (4p -7a ) and weekends on [email or web address]

## 2019-03-26 NOTE — Care Management (Signed)
CM received call from attending ; unfortunately pts nephew passed, pt requests temporary leave from [facility] to attend funeral once arrangements are made.  CM spoke with Linus Orn at [facility] and request can not be granted due to current infection risk of COVID.

## 2019-03-26 NOTE — Progress Notes (Signed)
Initial Nutrition Assessment  RD working remotely.  DOCUMENTATION CODES:   Not applicable, suspect acute malnutrition given poor po intake but unable to determine at this time without NFPE  INTERVENTION:   - Recommend liberalizing diet to REGULAR  - Ensure Enlive po TID, each supplement provides 350 kcal and 20 grams of protein  - Magic cup TID with meals, each supplement provides 290 kcal and 9 grams of protein  NUTRITION DIAGNOSIS:   Inadequate oral intake related to lethargy/confusion, poor appetite as evidenced by meal completion < 50%, meal completion < 25%.  GOAL:   Patient will meet greater than or equal to 90% of their needs  MONITOR:   PO intake, Supplement acceptance, I & O's, Weight trends, Labs, Skin, Other (GOC)  REASON FOR ASSESSMENT:   LOS    ASSESSMENT:   68 year old male who presented from [facility] on 4/05 with bloody stool. PMH of T2DM, PVD s/p toe amputations, atrial fibrillation, dementia, CKD stage III, sleep apnea, HTN, dyslipidemia, CHF. Pt is a resident at [facility]. Pt admitted with diverticulosis with bleeding.  4/06 - EGD showing esophagitis, bleeding duodenal ulcer treated with epi, cautery, endoclip   PCT following. Pt with ongoing confusion this admission.  Nephrology consulted 4/11 for AKI on CKD.  Noted pt has been lethargic throughout admission, refusing meals at times and refusing to get out of bed. Per PCT note, plan is to continue current care in hopes pt will improve and go to SNF. If pt does not improve, son is prepared to bring pt home with hospice.  Reviewed nephrology note this AM, renal function continues to show improvement.  Pt's weight has fluctuated since admission between 158-178 lbs. Suspect weight fluctuations related in part to fluid status. However, it appears pt is barely eating which might be contributing to true dry weight loss.  Reviewed weight history in chart. Pt with steady weight loss since 2017  per available weights. Pt with 0.6 kg weight loss since 12/03/18. This is a 1% weight loss which is not significant for timeframe.  Based on meal completion records, pt is not consuming enough to meet estimated kcal and protein needs. Suspect pt with severe acute malnutrition but unable to diagnose without NFPE. Based on review of PCT notes thus far, it does not appear that artificial nutrition is within pt's [facility] at this time but it has not addressed directly. RD reached out to PCT regarding poor PO intake via secure chat. Will order oral nutrition supplements at this time.  Meal Completion: 0-25% x 6 meals  Medications reviewed and include: SSI, Protonix, Senna, milrinone  Labs reviewed: sodium 134 (L), BUN 52 (H), creatinine 2.86 (H), hemoglobin 9.6 (L) CBG's: 98, 130, 130 x 24 hours  UOP: 425 ml x 24 hours I/O's: +18.7 L since admit  NUTRITION - FOCUSED PHYSICAL EXAM:  Unable to complete at this time. RD working remotely.  Diet Order:   Diet Order            Diet Heart Room service appropriate? Yes; Fluid consistency: Thin  Diet effective now              EDUCATION NEEDS:   Not appropriate for education at this time  Skin:  Skin Assessment: Skin Integrity Issues: Unstageable: no location listed  Last BM:  03/22/19  Height:   Ht Readings from Last 1 Encounters:  03/17/19 5\' 9"  (1.753 m)    Weight:   Wt Readings from Last 1 Encounters:  03/26/19 81.1  kg    Ideal Body Weight:  72.7 kg  BMI:  Body mass index is 26.4 kg/m.  Estimated Nutritional Needs:   Kcal:  1900-2100  Protein:  95-110 grams  Fluid:  >/= 1.8 L    Aaron Face, MS, RD, LDN Inpatient Clinical Dietitian Pager: [phone] Weekend/After Hours: [phone]

## 2019-03-26 NOTE — Progress Notes (Signed)
Patient ID: [facility], male   DOB: 12-01-1951, 68 y.o.   MRN: [id number] [facility] Progress Note   Assessment/ Plan:   1. Acute kidney Injury: Suspected to be initially hemodynamically mediated with acute blood loss anemia in the setting of ongoing ARB/Lasix/Aldactone use and likely additional injury from CHF exacerbation.  Barely nonoliguric overnight with likely inaccurately charted intake/output that reflects he is 18 L positive with also likely inaccurately charted weight that reflects he is up by 10 pounds overnight-this does not corroborate clinical findings.  Renal function continues to show improvement (he is not a candidate for chronic renal replacement therapy due to poor functional status and severe CHF) heart failure service to assist with restarting diuretics. 2.  Acute exacerbation of congestive heart failure: Systolic dysfunction with EF 10 to 15%.  Remains on milrinone for inotropic support, restart diuretics per heart failure service.  Appreciate input from palliative care service. 3.  Acute blood loss anemia secondary to GI bleed from duodenal ulcers.  Status post cauterization by gastroenterology with seemingly stable H/H-labs pending from this morning. 4.  Anion gap metabolic acidosis: Secondary to acute kidney injury/CHF exacerbation, improvement noted with labs this morning. 5.  Atrial fibrillation: occasional RVR noted, hemodynamically stable on amiodarone.  Subjective:   Denies any chest pain or shortness of breath.  Incomplete rest overnight.  Requests for assistance in seeing his brother and sister-in-law following the death in the family.   Objective:   BP 96/62 (BP Location: Left Arm)   Pulse (!) 119   Temp 97.8 F (36.6 C) (Oral)   Resp 14   Ht 5\' 9"  (1.753 m)   Wt 81.1 kg   SpO2 98%   BMI 26.40 kg/m   Intake/Output Summary (Last 24 hours) at 03/26/2019 0745 Last data filed at 03/26/2019 0400 Gross per 24 hour  Intake 18515.43 ml  Output 425  ml  Net 18090.43 ml   Weight change: 4.5 kg  Physical Exam: Gen: Appears to be comfortable resting in bed CVS: Irregularly irregular-tachycardic when seen 109, S1 and S2 normal Resp: Poor inspiratory effort with decreased breath sounds over bases Abd: Soft, flat, nontender Ext: No lower extremity edema, SCDs in situ  Imaging: No results found.  Labs: BMET Recent Labs  Lab 03/20/19 0856 03/21/19 0756 03/22/19 0234 03/23/19 0656 03/24/19 0923 03/25/19 0436 03/26/19 0325  NA 135 134* 135 135 136 134* 134*  K 2.9* 3.4* 4.1 4.7 4.2 3.5 3.8  CL 101 101 100 100 102 99 100  CO2 23 22 24  20* 21* 24 22  GLUCOSE 105* 121* 90 104* 134* 144* 119*  BUN 51* 48* 48* 58* 62* 53* 52*  CREATININE 2.14* 2.33* 2.54* 3.21* 3.22* 3.12* 2.86*  CALCIUM 7.9* 8.1* 8.5* 8.6* 8.0* 7.9* 7.9*   CBC Recent Labs  Lab 03/21/19 0756 03/22/19 0234 03/25/19 0436 03/26/19 0325  WBC 6.9 8.1 9.1 9.2  HGB 10.1* 10.6* 9.2* 9.6*  HCT 27.9* 29.4* 24.6* 29.3*  MCV 83.5 83.8 88.2 87.7  PLT 145* 181 145* 150    Medications:    . amiodarone  400 mg Oral BID  . atorvastatin  80 mg Oral q1800  . insulin aspart  0-15 Units Subcutaneous TID WC  . pantoprazole  40 mg Oral BID  . senna-docusate  1 tablet Oral BID  . sodium chloride flush  10-40 mL Intracatheter Q12H   Elmarie Shiley, MD 03/26/2019, 7:45 AM

## 2019-03-27 DIAGNOSIS — D62 Acute posthemorrhagic anemia: Secondary | ICD-10-CM | POA: Diagnosis not present

## 2019-03-27 DIAGNOSIS — I5022 Chronic systolic (congestive) heart failure: Secondary | ICD-10-CM | POA: Diagnosis not present

## 2019-03-27 DIAGNOSIS — K922 Gastrointestinal hemorrhage, unspecified: Secondary | ICD-10-CM | POA: Diagnosis not present

## 2019-03-27 DIAGNOSIS — I493 Ventricular premature depolarization: Secondary | ICD-10-CM | POA: Diagnosis not present

## 2019-03-27 DIAGNOSIS — I10 Essential (primary) hypertension: Secondary | ICD-10-CM | POA: Diagnosis not present

## 2019-03-27 DIAGNOSIS — E1151 Type 2 diabetes mellitus with diabetic peripheral angiopathy without gangrene: Secondary | ICD-10-CM | POA: Diagnosis not present

## 2019-03-27 DIAGNOSIS — R52 Pain, unspecified: Secondary | ICD-10-CM | POA: Diagnosis not present

## 2019-03-27 DIAGNOSIS — R5381 Other malaise: Secondary | ICD-10-CM | POA: Diagnosis not present

## 2019-03-27 DIAGNOSIS — B952 Enterococcus as the cause of diseases classified elsewhere: Secondary | ICD-10-CM | POA: Diagnosis not present

## 2019-03-27 DIAGNOSIS — I2510 Atherosclerotic heart disease of native coronary artery without angina pectoris: Secondary | ICD-10-CM | POA: Diagnosis not present

## 2019-03-27 DIAGNOSIS — N179 Acute kidney failure, unspecified: Secondary | ICD-10-CM | POA: Diagnosis not present

## 2019-03-27 DIAGNOSIS — I5023 Acute on chronic systolic (congestive) heart failure: Secondary | ICD-10-CM | POA: Diagnosis not present

## 2019-03-27 DIAGNOSIS — Z7401 Bed confinement status: Secondary | ICD-10-CM | POA: Diagnosis not present

## 2019-03-27 DIAGNOSIS — N401 Enlarged prostate with lower urinary tract symptoms: Secondary | ICD-10-CM

## 2019-03-27 DIAGNOSIS — N183 Chronic kidney disease, stage 3 (moderate): Secondary | ICD-10-CM | POA: Diagnosis not present

## 2019-03-27 DIAGNOSIS — E872 Acidosis: Secondary | ICD-10-CM | POA: Diagnosis not present

## 2019-03-27 DIAGNOSIS — N138 Other obstructive and reflux uropathy: Secondary | ICD-10-CM

## 2019-03-27 DIAGNOSIS — M2550 Pain in unspecified joint: Secondary | ICD-10-CM | POA: Diagnosis not present

## 2019-03-27 DIAGNOSIS — Z515 Encounter for palliative care: Secondary | ICD-10-CM | POA: Diagnosis not present

## 2019-03-27 DIAGNOSIS — Z7189 Other specified counseling: Secondary | ICD-10-CM | POA: Diagnosis not present

## 2019-03-27 DIAGNOSIS — E11649 Type 2 diabetes mellitus with hypoglycemia without coma: Secondary | ICD-10-CM | POA: Diagnosis not present

## 2019-03-27 DIAGNOSIS — N390 Urinary tract infection, site not specified: Secondary | ICD-10-CM | POA: Diagnosis not present

## 2019-03-27 DIAGNOSIS — K5791 Diverticulosis of intestine, part unspecified, without perforation or abscess with bleeding: Secondary | ICD-10-CM | POA: Diagnosis not present

## 2019-03-27 DIAGNOSIS — G4733 Obstructive sleep apnea (adult) (pediatric): Secondary | ICD-10-CM | POA: Diagnosis not present

## 2019-03-27 DIAGNOSIS — D649 Anemia, unspecified: Secondary | ICD-10-CM | POA: Diagnosis not present

## 2019-03-27 DIAGNOSIS — E785 Hyperlipidemia, unspecified: Secondary | ICD-10-CM | POA: Diagnosis not present

## 2019-03-27 DIAGNOSIS — I5043 Acute on chronic combined systolic (congestive) and diastolic (congestive) heart failure: Secondary | ICD-10-CM | POA: Diagnosis not present

## 2019-03-27 LAB — BASIC METABOLIC PANEL
Anion gap: 11 (ref 5–15)
BUN: 47 mg/dL — ABNORMAL HIGH (ref 8–23)
CO2: 24 mmol/L (ref 22–32)
Calcium: 8.2 mg/dL — ABNORMAL LOW (ref 8.9–10.3)
Chloride: 100 mmol/L (ref 98–111)
Creatinine, Ser: 2.82 mg/dL — ABNORMAL HIGH (ref 0.61–1.24)
GFR calc Af Amer: 25 mL/min — ABNORMAL LOW (ref >60)
GFR calc non Af Amer: 22 mL/min — ABNORMAL LOW (ref >60)
Glucose, Bld: 84 mg/dL (ref 70–99)
Potassium: 3.6 mmol/L (ref 3.5–5.1)
Sodium: 135 mmol/L (ref 135–145)

## 2019-03-27 LAB — COOXEMETRY PANEL
Carboxyhemoglobin: 1 % (ref 0.5–1.5)
Carboxyhemoglobin: 1.3 % (ref 0.5–1.5)
Methemoglobin: 1.6 % — ABNORMAL HIGH (ref 0.0–1.5)
Methemoglobin: 1.6 % — ABNORMAL HIGH (ref 0.0–1.5)
O2 Saturation: 48.9 %
O2 Saturation: 49 %
Total hemoglobin: 11.1 g/dL — ABNORMAL LOW (ref 12.0–16.0)
Total hemoglobin: 11.4 g/dL — ABNORMAL LOW (ref 12.0–16.0)

## 2019-03-27 LAB — CBC
HCT: 28.2 % — ABNORMAL LOW (ref 39.0–52.0)
Hemoglobin: 10.5 g/dL — ABNORMAL LOW (ref 13.0–17.0)
MCH: 32.9 pg (ref 26.0–34.0)
MCHC: 37.2 g/dL — ABNORMAL HIGH (ref 30.0–36.0)
MCV: 88.4 fL (ref 80.0–100.0)
Platelets: 167 10*3/uL (ref 150–400)
RBC: 3.19 MIL/uL — ABNORMAL LOW (ref 4.22–5.81)
RDW: 19.3 % — ABNORMAL HIGH (ref 11.5–15.5)
WBC: 10.1 10*3/uL (ref 4.0–10.5)
nRBC: 0 % (ref 0.0–0.2)

## 2019-03-27 LAB — GLUCOSE, CAPILLARY
Glucose-Capillary: 81 mg/dL (ref 70–99)
Glucose-Capillary: 109 mg/dL — ABNORMAL HIGH (ref 70–99)
Glucose-Capillary: 155 mg/dL — ABNORMAL HIGH (ref 70–99)

## 2019-03-27 MED ORDER — AMOXICILLIN 500 MG PO CAPS: 500.0000 mg | ORAL_CAPSULE | Freq: Two times a day (BID) | ORAL | 0 refills | Status: AC

## 2019-03-27 MED ORDER — POTASSIUM CHLORIDE CRYS ER 20 MEQ PO TBCR
40.0000 meq | EXTENDED_RELEASE_TABLET | Freq: Once | ORAL | Status: AC
  Administered 2019-03-27: 11:00:00 40 meq via ORAL
  Filled 2019-03-27: qty 2

## 2019-03-27 MED ORDER — AMIODARONE HCL 200 MG PO TABS: 200.0000 mg | ORAL_TABLET | Freq: Every day | ORAL | 0 refills | Status: AC

## 2019-03-27 MED ORDER — FUROSEMIDE 40 MG PO TABS: 80.0000 mg | ORAL_TABLET | Freq: Every day | ORAL | 0 refills | Status: DC

## 2019-03-27 MED ORDER — POTASSIUM CHLORIDE CRYS ER 20 MEQ PO TBCR: 20.0000 meq | EXTENDED_RELEASE_TABLET | Freq: Every day | ORAL | 0 refills | Status: AC

## 2019-03-27 MED ORDER — ATORVASTATIN CALCIUM 80 MG PO TABS: 80.0000 mg | ORAL_TABLET | Freq: Every day | ORAL | 0 refills | Status: AC

## 2019-03-27 MED ORDER — MEXILETINE HCL 150 MG PO CAPS: 150.0000 mg | ORAL_CAPSULE | Freq: Two times a day (BID) | ORAL | 0 refills | Status: AC

## 2019-03-27 MED ORDER — POTASSIUM CHLORIDE CRYS ER 20 MEQ PO TBCR: 20.0000 meq | EXTENDED_RELEASE_TABLET | Freq: Every day | ORAL | 0 refills | Status: DC

## 2019-03-27 MED ORDER — MAGNESIUM OXIDE 400 (241.3 MG) MG PO TABS
800.0000 mg | ORAL_TABLET | Freq: Once | ORAL | Status: AC
  Administered 2019-03-27: 11:00:00 800 mg via ORAL
  Filled 2019-03-27: qty 2

## 2019-03-27 MED ORDER — PANTOPRAZOLE SODIUM 40 MG PO TBEC: 40.0000 mg | DELAYED_RELEASE_TABLET | Freq: Two times a day (BID) | ORAL | 0 refills | Status: AC

## 2019-03-27 MED ORDER — FUROSEMIDE 40 MG PO TABS: 80.0000 mg | ORAL_TABLET | Freq: Every day | ORAL | 0 refills | Status: AC

## 2019-03-27 NOTE — Progress Notes (Signed)
Daily Progress Note   Patient Name: [facility]       Date: 03/27/2019 DOB: 08-Jun-1951  Age: 68 y.o. MRN#: [id number] Attending Physician: Damita Lack, MD Primary Care Physician: Marco Collie, MD Admit Date: 03/16/2019  Reason for Consultation/Follow-up: Establishing goals of care  Subjective: Per report and chart review - patient asking to leave the hospital, refusing to eat, remains weak  Length of Stay: 11  Current Medications: Scheduled Meds:  . amiodarone  200 mg Oral BID  . amoxicillin  500 mg Oral Q12H  . atorvastatin  80 mg Oral q1800  . feeding supplement (ENSURE ENLIVE)  237 mL Oral TID BM  . mexiletine  150 mg Oral Q12H  . pantoprazole  40 mg Oral BID  . senna-docusate  1 tablet Oral BID  . sodium chloride flush  10-40 mL Intracatheter Q12H    Continuous Infusions: . sodium chloride      PRN Meds: sodium chloride, acetaminophen **OR** acetaminophen, ondansetron **OR** ondansetron (ZOFRAN) IV, sodium chloride flush, sodium chloride flush, traMADol       Vital Signs: BP 114/85 (BP Location: Left Arm)   Pulse (!) 105   Temp (!) 97.1 F (36.2 C) (Oral)   Resp (!) 25   Ht 5\' 9"  (1.753 m)   Wt 74.6 kg   SpO2 97%   BMI 24.29 kg/m  SpO2: SpO2: 97 % O2 Device: O2 Device: Room Air O2 Flow Rate: O2 Flow Rate (L/min): 2 L/min  Intake/output summary:   Intake/Output Summary (Last 24 hours) at 03/27/2019 1159 Last data filed at 03/27/2019 1100 Gross per 24 hour  Intake 717 ml  Output 575 ml  Net 142 ml   LBM: Last BM Date: 03/22/19 Baseline Weight: Weight: 81.2 kg Most recent weight: Weight: 74.6 kg       Palliative Assessment/Data: PPS 40%    Flowsheet Rows     Most Recent Value  Intake Tab  Referral Department  Hospitalist  Unit at Time of Referral   Cardiac/Telemetry Unit  Palliative Care Primary Diagnosis  Cardiac  Date Notified  03/18/19  Palliative Care Type  New Palliative care  Reason for referral  Clarify Goals of Care  Date of Admission  03/16/19  Date first seen by Palliative Care  03/18/19  # of days Palliative referral response time  0 Day(s)  # of days IP prior to Palliative referral  2  Clinical Assessment  Palliative Performance Scale Score  40%  Psychosocial & Spiritual Assessment  Palliative Care Outcomes  Patient/Family meeting held?  Yes  Who was at the meeting?  son  Palliative Care Outcomes  Clarified goals of care, Provided advance care planning, Provided psychosocial or spiritual support, Changed CPR status, Completed durable DNR, Linked to palliative care logitudinal support      Patient Active Problem List   Diagnosis Date Noted  . Frequent PVCs   . Acute on chronic systolic heart failure ([place]) 03/24/2019  . AKI (acute kidney injury) ([place]) 03/24/2019  . Anemia 03/24/2019  . Goals of care, counseling/discussion   . Palliative care by specialist   . Pressure injury of skin 03/17/2019  . Diverticulosis of intestine with bleeding 03/16/2019  . Chronic  systolic CHF (congestive heart failure), NYHA class 4 ([place]) 12/02/2018  . OSA on CPAP 12/02/2018  . DM (diabetes mellitus), type 2 with peripheral vascular complications ([place]) 19/37/9024  . CKD (chronic kidney disease) stage 3, GFR 30-59 ml/min ([facility]) 12/02/2018  . Essential hypertension 12/02/2018  . Dyslipidemia 12/02/2018  . Benign prostatic hyperplasia with urinary obstruction 09/20/2015  . Family history of malignant neoplasm of prostate 09/20/2015  . Excessive urination at night 09/20/2015    Palliative Care Assessment & Plan   HPI: 68 y.o. male  with past medical history of CKD, T2DM, PVD with toe amputations, a fib, dementia, OSA on CPAP, HTN, HLD, and CHF w/ EF 10-15% admitted on 03/16/2019 with bloody stools. Recently discharged in Dec 2019 after  HF exacerbation, started on amiodarone for PVCs. Patient had a subsequent hospitalization at [facility] (family unsure why - dehydration?), and following this hospitalization he was placed in rehab at [facility] in [place]. Patient initially sent to [facility] again for bloody stools and transferred to [facility] d/t need for GI consult. Throughout admission, hgb has trended. Endoscopy 4/6 revealed multiple gastric and duodenal ulcers, nonbleeding. Hgb now stabilized. PMT consulted for [place].   Assessment: Received update from RN and Dr. Reesa Chew - continued weakness, not eating. Poor prognosis per heart failure team.   Spoke with son, [facility], and [facility], [facility], to provide update. Discussed that patient remains about the same - poor Po intake, weak, confused. We discussed patient's overall poor prognosis. Again discussed options of home with hospice vs SNF. Family is afraid they are unable to provide the support [facility] will need at home but also want [facility] to be at home during his final days. We discussed that [facility] prognosis is likely weeks-months. With this information they decide to move forward with SNF placement. We completed a MOST to reflect NO further hospitalizations and no aggressive measures. We discussed that as [facility] declines at facility and appears to approaching his final days - they can bring him home with hospice support. Family agrees with this plan.  Recommendations/Plan:  Per previous discussion - patient changed to DNR  Family's main goal is that patient not die in the hospital - would like his "final days" to be at home  Family agrees to SNF placement then home with hospice once rehab completed or if patient declines at rehab - MOST on chart to reflect DNR and do NOT hospitalize  Palliative to follow at facility - at least peripherally to support family  Code Status:  DNR  Prognosis:   Unable to determine - at high risk for acute decompensation  Discharge Planning:   [place] for rehab with Palliative care service follow-up  Care plan was discussed with RN and son, [facility], Dr. Reesa Chew  Thank you for allowing the Palliative Medicine Team to assist in the care of this patient.   The above conversation was completed via telephone due to the visitor restrictions during the COVID-19 pandemic. Thorough chart review and discussion with necessary members of the care team was completed as part of assessment. All issues were discussed and addressed but no physical exam was performed.   Total Time 35 mintues Prolonged Time Billed  no       Greater than 50%  of this time was spent counseling and coordinating care related to the above assessment and plan.  Juel Burrow, DNP, [facility] Palliative Medicine Team Team Phone # [phone]  Pager [phone]

## 2019-03-27 NOTE — Progress Notes (Signed)
Called [facility] 2x to give report but no one is picking-up the phone. To try later  again

## 2019-03-27 NOTE — TOC Transition Note (Signed)
Transition of Care [facility]) - CM/SW Discharge Note   Patient Details  Name: [facility] MRN: [id number] Date of Birth: [birth year]  [facility] CM/SW Contact:  Candie Chroman, LCSW Phone Number: 03/27/2019, 12:54 PM   Clinical Narrative:  CSW facilitated patient discharge including contacting patient family and facility to confirm patient discharge plans. Clinical information faxed to facility and family agreeable with plan. CSW arranged ambulance transport via [facility] to [place] at 2:00. RN to call report prior to discharge [phone] ext [phone]. Room 709).  CSW will sign off for now as social work intervention is no longer needed. Please consult [place] again if new needs arise.   Final next level of care: Skilled Nursing Facility Barriers to Discharge: Barriers Resolved   Patient Goals and CMS Choice Patient states their goals for this hospitalization and ongoing recovery are:: Get better  CMS Medicare.gov Compare Post Acute Care list provided to:: Other (Comment Required)(Patient from [place] and plans to return at discharge) Choice offered to / list presented to : NA  Discharge Placement   Existing PASRR number confirmed : 03/23/19          Patient chooses bed at: [place] [place] Patient to be transferred to facility by: [facility] Name of family member notified: Ronal Maybury Patient and family notified of of transfer: 03/27/19  Discharge Plan and Services In-house Referral: Clinical Social Work Discharge Planning Services: Other - See comment(SNF for continued rehab) Post Acute Care Choice: [place]          DME Arranged: N/A DME Agency: NA HH Arranged: NA HH Agency: NA   Social Determinants of Health (SDOH) Interventions     Readmission Risk Interventions No flowsheet data found.

## 2019-03-27 NOTE — Progress Notes (Signed)
Patient ID: Aaron Mclean, male   DOB: September 23, 1951, 68 y.o.   MRN: [id number] [facility] Progress Note   Assessment/ Plan:   1. Acute kidney Injury: Suspected to be initially hemodynamically mediated with acute blood loss anemia in the setting of ongoing ARB/Lasix/Aldactone use and likely additional injury from CHF exacerbation.  Urine output fair overnight-possibly tailing off (I suspect this might have a lot to do with his limited intake).  Not a candidate for renal replacement therapy given overall prognosis. 2.  Acute exacerbation of congestive heart failure: Systolic dysfunction with EF 10 to 15%.  Milrinone drip/inotropic support discontinued; low CVP this morning.  Would err on the side of conservative dosing of diuretics at the time of discharge-torsemide 20 mg daily or furosemide 40 mg daily. 3.  Acute blood loss anemia secondary to GI bleed from duodenal ulcers.  Status post cauterization by gastroenterology with seemingly stable H/H-labs pending from this morning. 4.  Disposition: Plans noted for transient placement to skilled nursing facility prior to going home with hospice-not a candidate for chronic therapies with impending mortality. 5.  Atrial fibrillation: occasional RVR noted, hemodynamically stable on amiodarone.  Subjective:   Without acute events overnight, appreciate clarifications with regards to goals of care and current events by palliative care service.  Denies chest pain or shortness of breath.   Objective:   BP 100/77   Pulse (!) 105   Temp (!) 97.3 F (36.3 C) (Oral)   Resp 14   Ht 5\' 9"  (1.753 m)   Wt 74.6 kg   SpO2 95%   BMI 24.29 kg/m   Intake/Output Summary (Last 24 hours) at 03/27/2019 0742 Last data filed at 03/27/2019 0354 Gross per 24 hour  Intake 600 ml  Output 1576 ml  Net -976 ml   Weight change: -6.5 kg  Physical Exam: Gen: Appears to be comfortable resting in bed CVS: Irregularly irregular-marginally tachycardic, S1 and S2  normal Resp: Decreased breath sounds over bases, no distinct rales or rhonchi Abd: Soft, flat, nontender Ext: No lower extremity edema, SCDs on the side  Imaging: No results found.  Labs: BMET Recent Labs  Lab 03/21/19 0756 03/22/19 0234 03/23/19 0656 03/24/19 0923 03/25/19 0436 03/26/19 0325 03/27/19 0426  NA 134* 135 135 136 134* 134* 135  K 3.4* 4.1 4.7 4.2 3.5 3.8 3.6  CL 101 100 100 102 99 100 100  CO2 22 24 20* 21* 24 22 24   GLUCOSE 121* 90 104* 134* 144* 119* 84  BUN 48* 48* 58* 62* 53* 52* 47*  CREATININE 2.33* 2.54* 3.21* 3.22* 3.12* 2.86* 2.82*  CALCIUM 8.1* 8.5* 8.6* 8.0* 7.9* 7.9* 8.2*   CBC Recent Labs  Lab 03/22/19 0234 03/25/19 0436 03/26/19 0325 03/27/19 0426  WBC 8.1 9.1 9.2 10.1  HGB 10.6* 9.2* 9.6* 10.5*  HCT 29.4* 24.6* 29.3* 28.2*  MCV 83.8 88.2 87.7 88.4  PLT 181 145* 150 167    Medications:    . amiodarone  200 mg Oral BID  . amoxicillin  500 mg Oral Q12H  . atorvastatin  80 mg Oral q1800  . feeding supplement (ENSURE ENLIVE)  237 mL Oral TID BM  . insulin aspart  0-15 Units Subcutaneous TID WC  . magnesium oxide  800 mg Oral Once  . mexiletine  150 mg Oral Q12H  . pantoprazole  40 mg Oral BID  . potassium chloride  40 mEq Oral Once  . senna-docusate  1 tablet Oral BID  . sodium chloride flush  10-40 mL Intracatheter Q12H   Elmarie Shiley, MD 03/27/2019, 7:42 AM

## 2019-03-27 NOTE — Progress Notes (Signed)
Would scream calling out names. Saying " I want to leave" explained that he will be leaving soon, back to [facility], awaiting [facility] ambulance for trans port.

## 2019-03-27 NOTE — Discharge Summary (Addendum)
Physician Discharge Summary  Aaron Mclean [id number]:[id number] DOB: 01/22/1951 DOA: 03/16/2019  PCP: Marco Collie, MD  Admit date: 03/16/2019 Discharge date: 03/27/2019  Admitted From: Collapse Disposition:  [facility] with Palliative to follow there.   Recommendations for Outpatient Follow-up:  1. Follow up with PCP in 1-2 weeks 2. Please obtain BMP/CBC in one week your next doctors visit.  3. Cardiac meds recommended-Lasix 80 mg daily, amiodarone 200 mg daily, mexiletine 150 mg twice daily, potassium chloride 20 mEq daily, atorvastatin 80 mg daily 4. Hold aspirin for at least 4 weeks 5. Stop losartan and Aldactone   Discharge Condition: Stable CODE STATUS: DNR Diet recommendation: Cardiac  Brief/Interim Summary: 68 year old with past medical history of uncontrolled diabetes mellitus type 2, peripheral vascular disease status post toe amputation, atrial fibrillation, CKD stage III, systolic CHF with ejection fraction 10-15%, dementia, hyperlipidemia, hypertension initially came to [facility] for evaluation of dark stools but due to lack of GI coverage he was transferred here for further care. Hemoglobin trended downwards to 8.5. Endoscopy showed multiple gastric and duodenal nonbleeding ulcer. He received 2 units of PRBC transfusion and was started on PPI twice daily. Hospital course was complicated by worsening of renal function and CHF exacerbation. Concerns of low cardiac output. CHF team was consulted who recommended starting patient on milrinone. Nephrology team is also following the patient. Palliative care team was consulted. Per heart failure team patient is not a candidate for aggressive management or home inotrope therapy.  Medication adjustments were made per cardiology team is recommended above. Poor prognosis otherwise medically as best as possible he could be therefore will discharge as patient really wants to leave the hospital.  Discharge Diagnoses:  Principal Problem:  Diverticulosis of intestine with bleeding Active Problems:   Benign prostatic hyperplasia with urinary obstruction   Chronic systolic CHF (congestive heart failure), NYHA class 4 (HCC)   OSA on CPAP   DM (diabetes mellitus), type 2 with peripheral vascular complications (HCC)   CKD (chronic kidney disease) stage 3, GFR 30-59 ml/min (HCC)   Essential hypertension   Dyslipidemia   Pressure injury of skin   Goals of care, counseling/discussion   Palliative care by specialist   Acute on chronic systolic heart failure ([place])   AKI (acute kidney injury) ([place])   Anemia   Frequent PVCs  Acute on chronic congestive heart failure with reduced ejection fraction, 15% -Echocardiogram 03/16/2019 shows ejection fraction 15%. Low CVP therefore holding off on diuretics. Holding nephrotoxic drugs including ACE/arband Aldactone. Overall management per CHF team. Overall does have poor prognosis due to limited treatment options. Palliative care team recommends home hospice or have palliative care team follow at [facility]. - Milrinone drip turned off.  Cardiology recommends stopping losartan and Aldactone as well.  Lasix 80 mg orally daily with potassium chloride supplement.  Acute kidney injury on CKD stage III -Suspect cardiorenal syndrome versus ATN. Renal function has slightly improved to 2.82. Stabilized. Other electrolytes are okay. Renal ultrasound is negative for acute pathology. [place] nephrology team input.  Premature ventricular contraction - Monitor electrolytes closely.  Amiodarone and mexiletine  Urinary tract infection with Enterococcus faecalis -Sensitivities available today. Will discontinue vancomycin in the setting of acute kidney injury and transition to amoxicillin 500 mg.  Symptomatic anemia due to acute blood loss Upper GI bleed, stabilized -Status post endoscopy showing nonbleeding gastritis and duodenal ulcer. Status post APC. Continue PPI. -Status post 2 units of  PRBC transfusion.  History of coronary artery disease, multivessel - Currently remains chest  pain-free. Continue high-dose statin. Not on anticoagulation including aspirin due to GI bleed.  Diabetes mellitus type 2 with intermittent hypoglycemia -Hypoglycemia secondary to poor oral intake.  Advised him that if he does not eat and drink much, should hold off on long-acting antidiabetic medication.  Should treat this with sliding scale.  Unfortunately patient is very poor prognosis given his advanced cardiac condition.  Hospice team should follow patient upon discharge at [place].  Remov PICC prior to discharge. His appetite remains extremely poor and does not have desire to eat much.  Consultations:  Heart failure  Palliative care  Subjective: Patient is very adamant about leaving the hospital today.  Today he reports that his brother has passed away.  Off-and-on has moments of confusion.  Had an episode of hypoglycemia overnight.  Appetite is very poor.  Discharge Exam: Vitals:   03/27/19 0952 03/27/19 1156  BP: 109/83 114/85  Pulse: (!) 106 (!) 105  Resp: (!) 30 (!) 25  Temp: 97.7 F (36.5 C) (!) 97.1 F (36.2 C)  SpO2: 96% 97%   Vitals:   03/27/19 0617 03/27/19 0800 03/27/19 0952 03/27/19 1156  BP:   109/83 114/85  Pulse:  (!) 103 (!) 106 (!) 105  Resp:   (!) 30 (!) 25  Temp:   97.7 F (36.5 C) (!) 97.1 F (36.2 C)  TempSrc:   Oral Oral  SpO2:  97% 96% 97%  Weight: 74.6 kg     Height:        General: Pt is alert, awake, not in acute distress Cardiovascular: RRR, S1/S2 +, no rubs, no gallops Respiratory: Diminished breath sounds at the bases Abdominal: Soft, NT, ND, bowel sounds + Extremities: no edema, no cyanosis  Discharge Instructions   Allergies as of 03/27/2019      Reactions   Sertraline Rash      Medication List    STOP taking these medications   acetaminophen 325 MG tablet Commonly known as:  TYLENOL   aspirin EC 81 MG tablet   carvedilol  3.125 MG tablet Commonly known as:  COREG   docusate sodium 100 MG capsule Commonly known as:  Colace   liraglutide 18 MG/3ML Sopn Commonly known as:  VICTOZA   losartan 25 MG tablet Commonly known as:  COZAAR   spironolactone 25 MG tablet Commonly known as:  ALDACTONE   XULTOPHY [place]     TAKE these medications   amiodarone 200 MG tablet Commonly known as:  PACERONE Take 1 tablet (200 mg total) by mouth daily for 30 days. What changed:  Another medication with the same name was removed. Continue taking this medication, and follow the directions you see here.   amoxicillin 500 MG capsule Commonly known as:  AMOXIL Take 1 capsule (500 mg total) by mouth every 12 (twelve) hours for 5 days.   atorvastatin 80 MG tablet Commonly known as:  LIPITOR Take 1 tablet (80 mg total) by mouth daily at 6 PM. What changed:  when to take this   furosemide 40 MG tablet Commonly known as:  Lasix Take 2 tablets (80 mg total) by mouth daily for 30 days. If weight trend up, take 80mg  po daily What changed:    how much to take  when to take this  reasons to take this  additional instructions   Lantus 100 UNIT/ML injection Generic drug:  insulin glargine Inject 12 Units into the skin daily.   mexiletine 150 MG capsule Commonly known as:  MEXITIL Take 1 capsule (  150 mg total) by mouth 2 (two) times daily for 30 days.   ondansetron 4 MG tablet Commonly known as:  ZOFRAN Take 4 mg by mouth every 6 (six) hours as needed for nausea or vomiting.   pantoprazole 40 MG tablet Commonly known as:  PROTONIX Take 1 tablet (40 mg total) by mouth 2 (two) times daily for 30 days. What changed:  when to take this   potassium chloride SA 20 MEQ tablet Commonly known as:  Klor-Con M20 Take 1 tablet (20 mEq total) by mouth daily for 30 days.       Allergies  Allergen Reactions  . Sertraline Rash    You were cared for by a hospitalist during your hospital stay. If you have any questions  about your discharge medications or the care you received while you were in the hospital after you are discharged, you can call the unit and asked to speak with the hospitalist on call if the hospitalist that took care of you is not available. Once you are discharged, your primary care physician will handle any further medical issues. Please note that no refills for any discharge medications will be authorized once you are discharged, as it is imperative that you return to your primary care physician (or establish a relationship with a primary care physician if you do not have one) for your aftercare needs so that they can reassess your need for medications and monitor your lab values.   Procedures/Studies: US Renal  Result Date: 03/22/2019 CLINICAL DATA:  Acute kidney injury. EXAM: RENAL / URINARY TRACT ULTRASOUND COMPLETE COMPARISON:  None. FINDINGS: Right Kidney: Renal measurements: 10.4 x 4.4 x 5.5 cm = volume: 132 mL. No hydronephrosis. Diffusely increased parenchymal echogenicity. Left Kidney: Renal measurements: 9.7 x 4.5 x 5.2 cm = volume: 119 mL. No hydronephrosis. Diffusely increased parenchymal echogenicity. Simple cyst in the lower kidney measuring 1.5 cm. Small amount of perinephric fluid. Bladder: Decompressed by Foley catheter and not well assessed. Right pleural effusion and right upper quadrant ascites incidentally noted. IMPRESSION: 1. Increased bilateral renal echogenicity suggesting chronic medical renal disease. No hydronephrosis. 2. Small left perinephric fluid, nonspecific but can be seen with urinary tract infection. Electronically Signed   By: Keith Rake M.D.   On: 03/22/2019 20:24   Dg Chest Port 1 View  Result Date: 03/21/2019 CLINICAL DATA:  Shortness of Breath EXAM: PORTABLE CHEST 1 VIEW COMPARISON:  02/24/2019 FINDINGS: Cardiomegaly. Layering bilateral effusions with bilateral perihilar and lower lobe airspace opacities, right greater than left. This could reflect  asymmetric edema or infection. No acute bony abnormality. IMPRESSION: Cardiomegaly. Layering bilateral effusions and bilateral airspace disease, right greater than left. This could reflect asymmetric edema or infection. Electronically Signed   By: Rolm Baptise M.D.   On: 03/21/2019 10:04   [place] Ekg Site Rite  Result Date: 03/23/2019 If Site Rite image not attached, placement could not be confirmed due to current cardiac rhythm.    The results of significant diagnostics from this hospitalization (including imaging, microbiology, ancillary and laboratory) are listed below for reference.     Microbiology: Recent Results (from the past 240 hour(s))  Culture, Urine     Status: Abnormal   Collection Time: 03/23/19  3:00 PM  Result Value Ref Range Status   Specimen Description URINE, CLEAN CATCH  Final   Special Requests   Final    NONE Performed at [facility], [address] [place], [place] [address]    Culture >=100,000 COLONIES/mL  ENTEROCOCCUS FAECALIS (A)  Final   Report Status 03/26/2019 FINAL  Final   Organism ID, Bacteria ENTEROCOCCUS FAECALIS (A)  Final      Susceptibility   Enterococcus faecalis - MIC*    AMPICILLIN <=2 SENSITIVE Sensitive     LEVOFLOXACIN >=8 RESISTANT Resistant     NITROFURANTOIN <=16 SENSITIVE Sensitive     VANCOMYCIN 1 SENSITIVE Sensitive     * >=100,000 COLONIES/mL ENTEROCOCCUS FAECALIS     Labs: BNP (last 3 results) No results for input(s): BNP in the last 8760 hours. Basic Metabolic Panel: Recent Labs  Lab 03/23/19 0656 03/24/19 0923 03/25/19 0436 03/26/19 0325 03/27/19 0426  NA 135 136 134* 134* 135  K 4.7 4.2 3.5 3.8 3.6  CL 100 102 99 100 100  CO2 20* 21* 24 22 24   GLUCOSE 104* 134* 144* 119* 84  BUN 58* 62* 53* 52* 47*  CREATININE 3.21* 3.22* 3.12* 2.86* 2.82*  CALCIUM 8.6* 8.0* 7.9* 7.9* 8.2*  MG  --   --  1.8  --   --    Liver Function Tests: No results for input(s): AST, ALT, ALKPHOS, BILITOT, PROT, ALBUMIN in the last  168 hours. No results for input(s): LIPASE, AMYLASE in the last 168 hours. No results for input(s): AMMONIA in the last 168 hours. CBC: Recent Labs  Lab 03/21/19 0756 03/22/19 0234 03/25/19 0436 03/26/19 0325 03/27/19 0426  WBC 6.9 8.1 9.1 9.2 10.1  HGB 10.1* 10.6* 9.2* 9.6* 10.5*  HCT 27.9* 29.4* 24.6* 29.3* 28.2*  MCV 83.5 83.8 88.2 87.7 88.4  PLT 145* 181 145* 150 167   Cardiac Enzymes: No results for input(s): CKTOTAL, CKMB, CKMBINDEX, TROPONINI in the last 168 hours. BNP: Invalid input(s): POCBNP CBG: Recent Labs  Lab 03/26/19 1624 03/26/19 2131 03/26/19 2223 03/27/19 0644 03/27/19 1145  GLUCAP 124* 69* 121* 81 109*   D-Dimer No results for input(s): DDIMER in the last 72 hours. Hgb A1c No results for input(s): HGBA1C in the last 72 hours. Lipid Profile No results for input(s): CHOL, HDL, LDLCALC, TRIG, CHOLHDL, LDLDIRECT in the last 72 hours. Thyroid function studies No results for input(s): TSH, T4TOTAL, T3FREE, THYROIDAB in the last 72 hours.  Invalid input(s): FREET3 Anemia work up No results for input(s): VITAMINB12, FOLATE, FERRITIN, TIBC, IRON, RETICCTPCT in the last 72 hours. Urinalysis    Component Value Date/Time   COLORURINE YELLOW 03/22/2019 1538   APPEARANCEUR CLEAR 03/22/2019 1538   LABSPEC 1.010 03/22/2019 1538   PHURINE 6.0 03/22/2019 1538   GLUCOSEU NEGATIVE 03/22/2019 1538   HGBUR MODERATE (A) 03/22/2019 1538   BILIRUBINUR NEGATIVE 03/22/2019 1538   KETONESUR NEGATIVE 03/22/2019 1538   PROTEINUR 30 (A) 03/22/2019 1538   NITRITE NEGATIVE 03/22/2019 1538   LEUKOCYTESUR TRACE (A) 03/22/2019 1538   Sepsis Labs Invalid input(s): PROCALCITONIN,  WBC,  LACTICIDVEN Microbiology Recent Results (from the past 240 hour(s))  Culture, Urine     Status: Abnormal   Collection Time: 03/23/19  3:00 PM  Result Value Ref Range Status   Specimen Description URINE, CLEAN CATCH  Final   Special Requests   Final    NONE Performed at [facility], [place] [address], [place], [place] [address]    Culture >=100,000 COLONIES/mL ENTEROCOCCUS FAECALIS (A)  Final   Report Status 03/26/2019 FINAL  Final   Organism ID, Bacteria ENTEROCOCCUS FAECALIS (A)  Final      Susceptibility   Enterococcus faecalis - MIC*    AMPICILLIN <=2 SENSITIVE Sensitive  LEVOFLOXACIN >=8 RESISTANT Resistant     NITROFURANTOIN <=16 SENSITIVE Sensitive     VANCOMYCIN 1 SENSITIVE Sensitive     * >=100,000 COLONIES/mL ENTEROCOCCUS FAECALIS     Time coordinating discharge:  I have spent 35 minutes face to face with the patient and on the ward discussing the patients care, assessment, plan and disposition with other care givers. >50% of the time was devoted counseling the patient about the risks and benefits of treatment/Discharge disposition and coordinating care.   SIGNED:   Damita Lack, MD  [facility] 03/27/2019, 12:57 PM   If 7PM-7AM, please contact night-coverage [email or web address]

## 2019-03-27 NOTE — Progress Notes (Signed)
Discharged to [facility] by [place] ambulance, discharge paper endorsed to ambulance staff, belongings  with the pt.

## 2019-03-27 NOTE — Progress Notes (Signed)
Advanced Heart Failure Rounding Note   Subjective:     Palliative Care note appreciated.   Remains very weak. Says he wants to go home. Aaron Mclean with Hospice. Seems to be confused at times and worried about various family members.   Co-ox down to 49% (shock range) with stopping of milrinone yesterday. Creatinine improving slightly.  CVP 3->7. Mexilitene started yesterday due to frequent PVCs.   No further bleeding. Hgb 10.5     Objective:   Weight Range:  Vital Signs:   Temp:  [97.2 F (36.2 C)-97.8 F (36.6 C)] 97.7 F (36.5 C) (04/16 0952) Pulse Rate:  [43-119] 106 (04/16 0952) Resp:  [13-30] 30 (04/16 0952) BP: (82-111)/(57-83) 109/83 (04/16 0952) SpO2:  [92 %-100 %] 96 % (04/16 0952) Weight:  [74.6 kg] 74.6 kg (04/16 0617) Last BM Date: 03/22/19  Weight change: Filed Weights   03/25/19 0300 03/26/19 0500 03/27/19 0617  Weight: 76.6 kg 81.1 kg 74.6 kg    Intake/Output:   Intake/Output Summary (Last 24 hours) at 03/27/2019 1024 Last data filed at 03/27/2019 0354 Gross per 24 hour  Intake 480 ml  Output 575 ml  Net -95 ml     Physical Exam: General:  Pale lying flat in bed. Very weak  No resp difficulty HEENT: normal Neck: supple. CVP 7. Carotids 2+ bilat; no bruits. No lymphadenopathy or thryomegaly appreciated. Cor: PMI laterally displaced. Irregular rate & rhythm. No rubs, gallops or murmurs. Lungs: clear Abdomen: soft, nontender, nondistended. No hepatosplenomegaly. No bruits or masses. Good bowel sounds. Extremities: no cyanosis, clubbing, rash, edema Neuro: alert & orientedx3, cranial nerves grossly intact. moves all 4 extremities w/o difficulty. Affect pleasant   Telemetry:  Sinus rhythm 100 with frequent PVCs 20-30/hr   Labs: Basic Metabolic Panel: Recent Labs  Lab 03/23/19 0656 03/24/19 0923 03/25/19 0436 03/26/19 0325 03/27/19 0426  NA 135 136 134* 134* 135  K 4.7 4.2 3.5 3.8 3.6  CL 100 102 99 100 100  CO2 20* 21* 24 22 24   GLUCOSE  104* 134* 144* 119* 84  BUN 58* 62* 53* 52* 47*  CREATININE 3.21* 3.22* 3.12* 2.86* 2.82*  CALCIUM 8.6* 8.0* 7.9* 7.9* 8.2*  MG  --   --  1.8  --   --     Liver Function Tests: No results for input(s): AST, ALT, ALKPHOS, BILITOT, PROT, ALBUMIN in the last 168 hours. No results for input(s): LIPASE, AMYLASE in the last 168 hours. No results for input(s): AMMONIA in the last 168 hours.  CBC: Recent Labs  Lab 03/21/19 0756 03/22/19 0234 03/25/19 0436 03/26/19 0325 03/27/19 0426  WBC 6.9 8.1 9.1 9.2 10.1  HGB 10.1* 10.6* 9.2* 9.6* 10.5*  HCT 27.9* 29.4* 24.6* 29.3* 28.2*  MCV 83.5 83.8 88.2 87.7 88.4  PLT 145* 181 145* 150 167    Cardiac Enzymes: No results for input(s): CKTOTAL, CKMB, CKMBINDEX, TROPONINI in the last 168 hours.  BNP: BNP (last 3 results) No results for input(s): BNP in the last 8760 hours.  ProBNP (last 3 results) No results for input(s): PROBNP in the last 8760 hours.    Other results:  Imaging: No results found.   Medications:     Scheduled Medications: . amiodarone  200 mg Oral BID  . amoxicillin  500 mg Oral Q12H  . atorvastatin  80 mg Oral q1800  . feeding supplement (ENSURE ENLIVE)  237 mL Oral TID BM  . magnesium oxide  800 mg Oral Once  . mexiletine  150  mg Oral Q12H  . pantoprazole  40 mg Oral BID  . potassium chloride  40 mEq Oral Once  . senna-docusate  1 tablet Oral BID  . sodium chloride flush  10-40 mL Intracatheter Q12H    Infusions: . sodium chloride      PRN Medications: sodium chloride, acetaminophen **OR** acetaminophen, ondansetron **OR** ondansetron (ZOFRAN) IV, sodium chloride flush, sodium chloride flush, traMADol   Assessment:   Aaron Mclean is a 68 year old with a history of DM2, neuropathy, HTN, CKD 3,OSA,PVCs, CAD, and hyperlipidemia. He had newly diagnosed mod to severe Aaron and acute systolic HF with EF 24-58 diagnosed in 11/2018.    Admitted from [facility] with GI bleed.    Plan/Discussion:      1. A/C Systolic Heart Failure, mixed ICM/NICM   - ECHO on 03/16/19 EF 15%.  - Milrinone stopped yesterday Co-ox back down to shock range. Will repeat to make sure it is accurate.  - CVP climbing up a bit. Wil start lasix 80 po daily -  Off b-blocker with shock  - No dig, losartan, or spiro due to elevated creatinine.  - Prognosis very poor. It is possible that he may have primarily a PVC cardiomyopathy and that EF may improve with suppression of PVCs with mexilitene over the next month or two but given comorbidities and the fact that he didn't feel better with milrinone support. Doubt this will change his trajectory much.  - He is not a candidate for aggressive HF management. Not candidate for home inotropes of advanced therapies    2. AKI on CKD Stage III -Creatinine trended up from 2.2--> 3.2. now beginning to improve -> 2.86 -> 2.82 -Suspect ATN from hypotension in setting of GIB. +/- cardio renal syndrome. Continue milrinone for now.  - CVP 7 today.Restart diuretics.  - Renal US was negative for hydronephrosis.  - Nephrology following  3. PVCs - Has extremely high burden. May be contributing to cardiomyopathy  - Amio doesn't seem to suppress PVCs well still with 20 PVCs per minute - Mexilitene 150 bid added 4/15 - Keep K> 4.0 Mg >2.0  4. PAF/PAFL - this is new onset on 4/13. It was transient in setting of milrinone. Now back in NSR - continue amio - No AC with acute GI bleed   5. Symptomatic anemia due to acute Upper GI Bleed - GI consulted. Received total of 2UPRBCs.  - S/P EGD on 4/6 with gastritis and duodenal ulcers. 1 with APC.  - Hgb stable today.  - Primary team mangaging - On PPI.   6. CAD  - LHC 2019 with :Prox RCA to Mid RCA lesion is 80% stenosed. Mid RCA lesion is 60% stenosed.Dist RCA lesion is 20% stenosed with 20% stenosed side branch in Post Atrio. Prox Cx lesion is 30% stenosed. Mid Cx lesion is 100% stenosed. Ost 2nd Mrg to 2nd Mrg lesion is 70%  stenosed. Ost LM lesion is 30% stenosed. Prox LAD lesion is 30% stenosed. Mid LAD to Dist LAD lesion is 60% stenosed.  - No evidence ACS - On high dose statin. No asa with GI bleed.   7. DNR - He is very debilitated and apparently severely depressed - We discussed EOL issues and his will to live. He seems to have very poor QOL - Palliative Care following. Agree with Hospice.    If goes home with Hospice would recommend  Lasix 80 daily Amio 200 daily Mexilitene 150 bid (if he can afford) KCl 20 daily  Atorva 80 daily.   Hold ASA for 4 weeks with GI bleed   Stop losartan and spiro   Length of Stay: 11  Aaron Bickers, MD  10:24 AM   Advanced Heart Failure Team Pager [phone] (M-F; 7a - 4p)  Please contact [facility] for night-coverage after hours (4p -7a ) and weekends on [email or web address]

## 2019-03-27 NOTE — Progress Notes (Signed)
Report given to staff in [facility] .

## 2019-03-27 NOTE — Progress Notes (Signed)
PT Cancellation Note  Patient Details Name: Aaron Mclean MRN: [id number] DOB: [birth year]   Cancelled Treatment:    Reason Eval/Treat Not Completed: Other (comment)(Pt has discharged).   03/27/2019  Donnella Sham, [place] [phone]  (pager) [phone]  (office)   [name] 03/27/2019, 3:24 PM

## 2019-03-27 NOTE — Progress Notes (Signed)
Refused to eat nor drink anything despite coaxing given.Cbg dropped to 60's last night  MD aware. Insulin sliding scale , d/ced.

## 2019-03-27 NOTE — Consult Note (Signed)
   [facility] Inpatient Consult   03/27/2019  Aaron Mclean 1951/11/15 [id number]   Patient was evaluated for [facility] services for readmission and disposition to skilled facility and suggesting follow up with Palliative care.  Patient to [place]. Will notify PAC of disposition. For questions, please contact:   Natividad Brood, RN BSN [facility] Liaison  [phone] business mobile phone Toll free office [phone]

## 2019-04-11 DEATH — deceased

## 2021-04-02 IMAGING — US US RENAL
1 series · 14 of 25 positions shown · non-contrast
Comparison: None.

CLINICAL DATA: Acute kidney injury.

EXAM:
RENAL / URINARY TRACT ULTRASOUND COMPLETE

[Series 1: us renal · 14 of 26 slices shown]
[im 1/26]
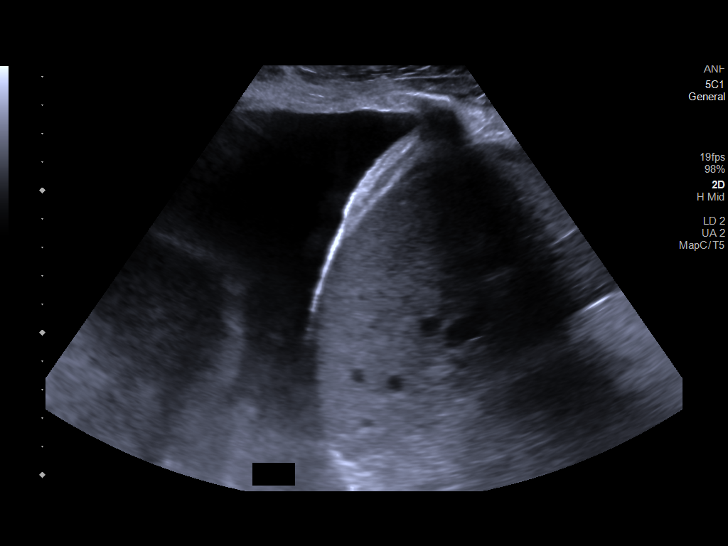
[im 3/26]
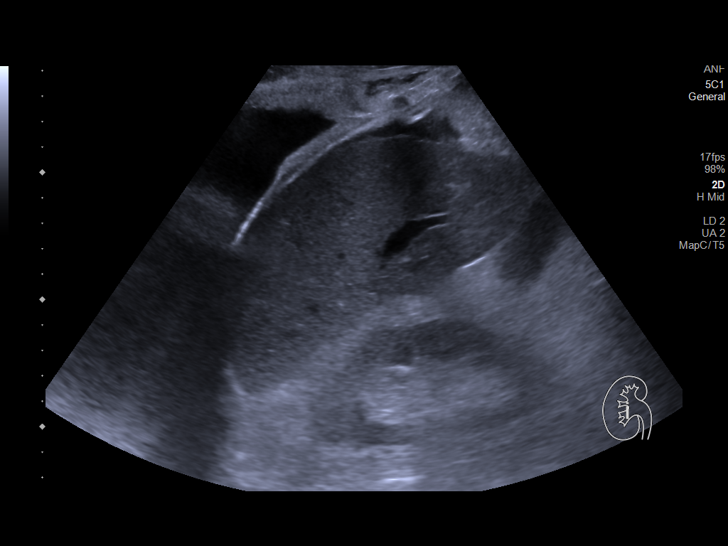
[im 5/26]
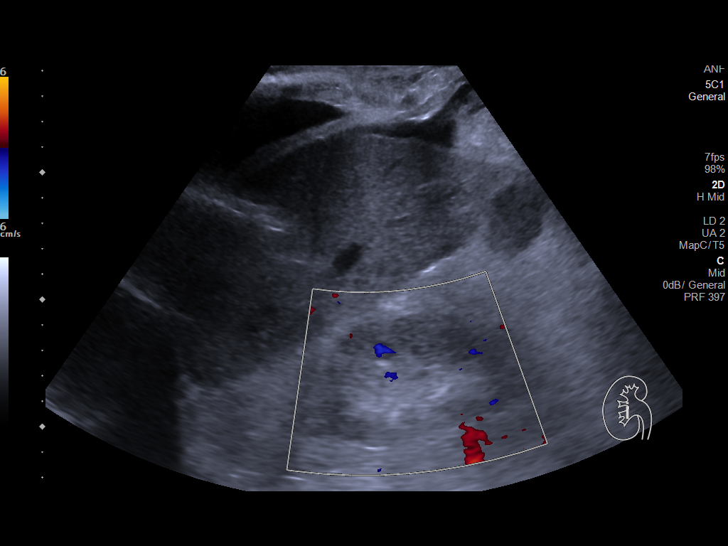
[im 7/26]
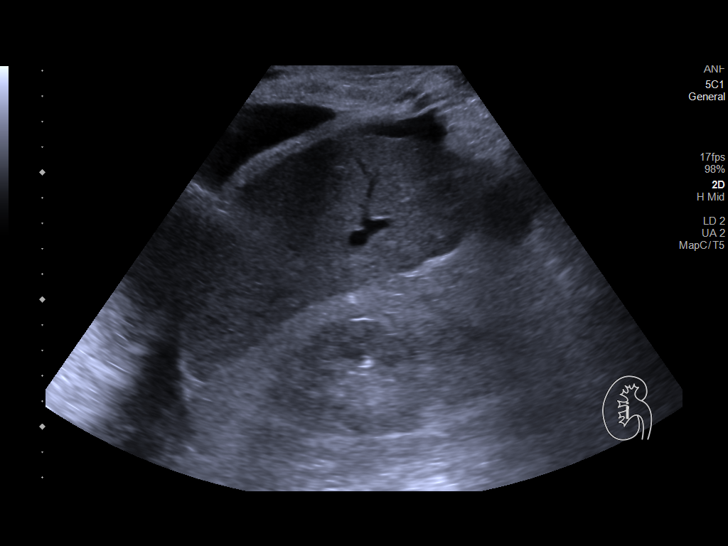
[im 9/26]
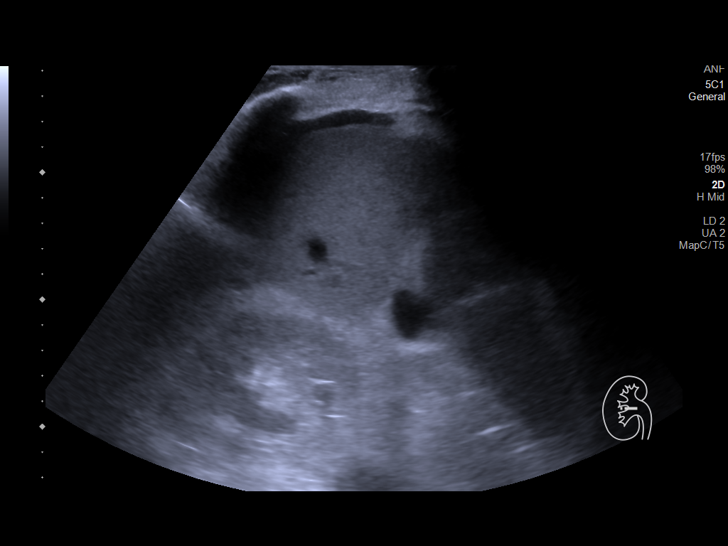
[im 10/26]
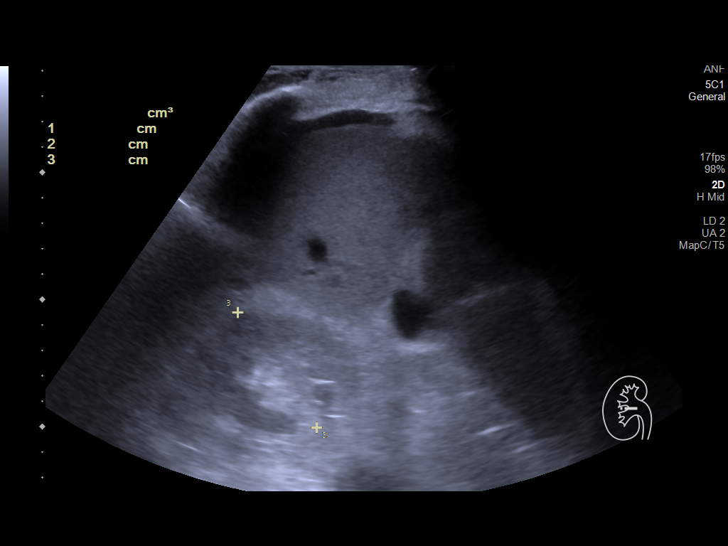
[im 12/26]
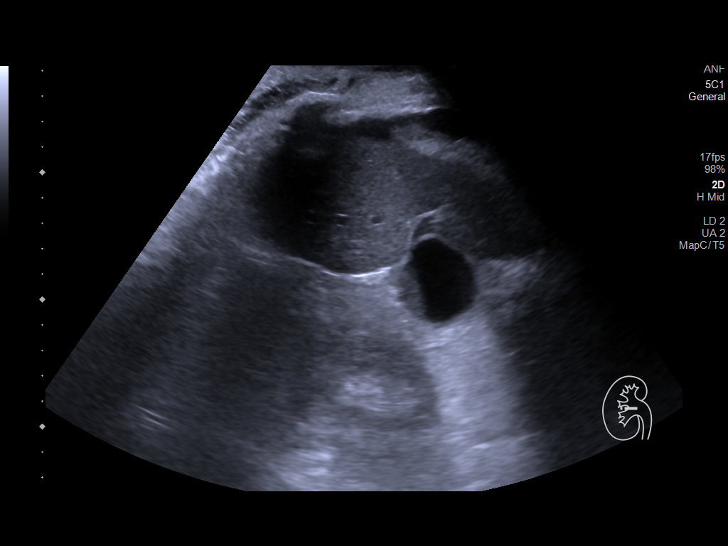
[im 14/26]
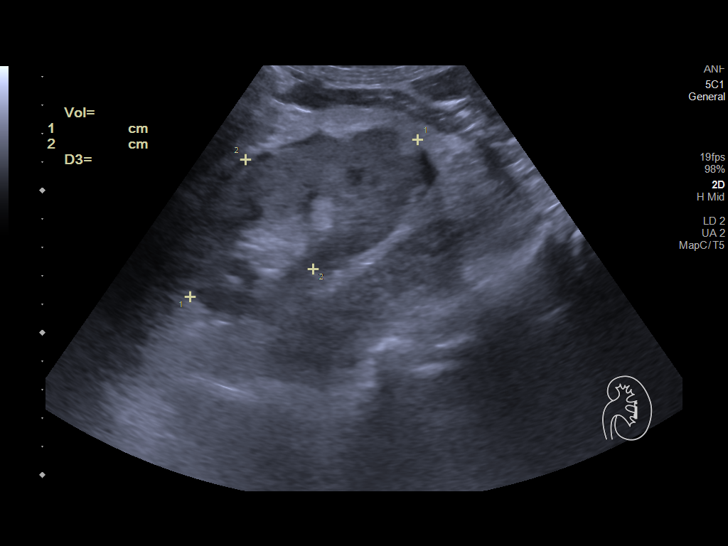
[im 16/26]
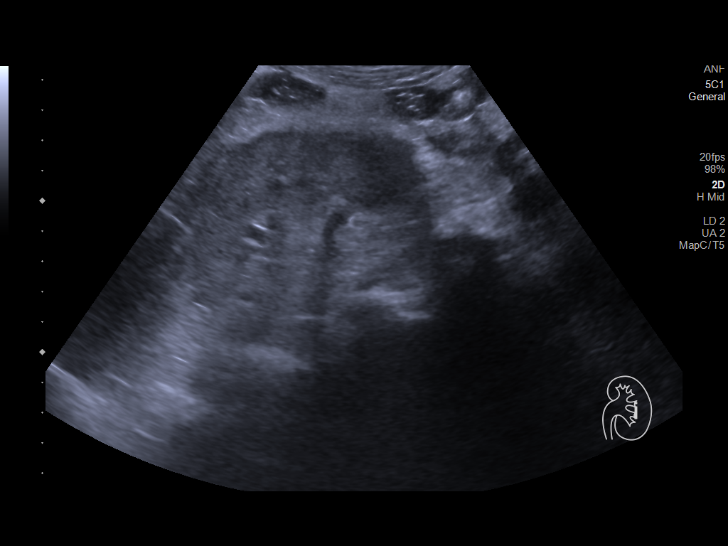
[im 17/26]
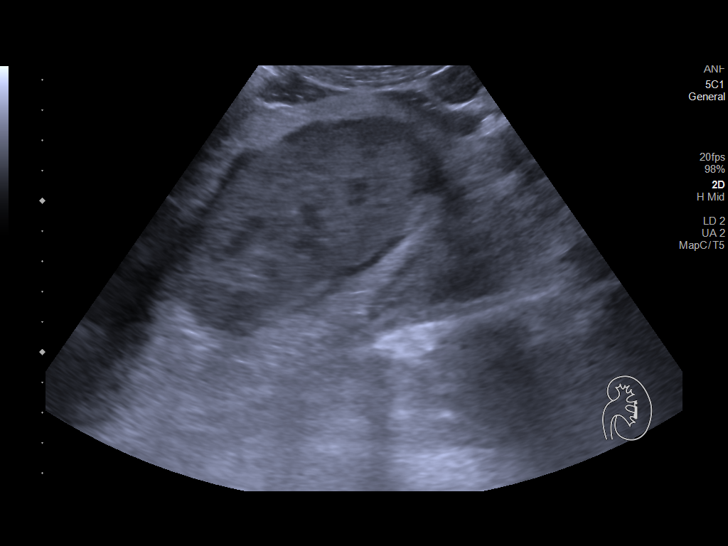
[im 19/26]
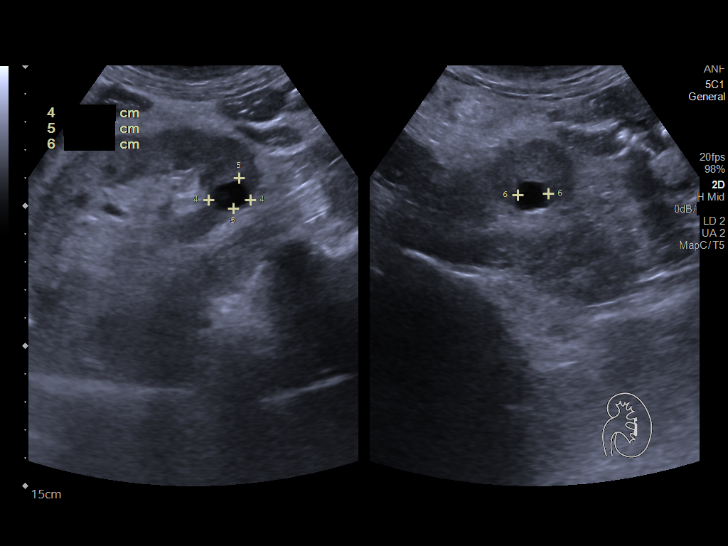
[im 21/26]
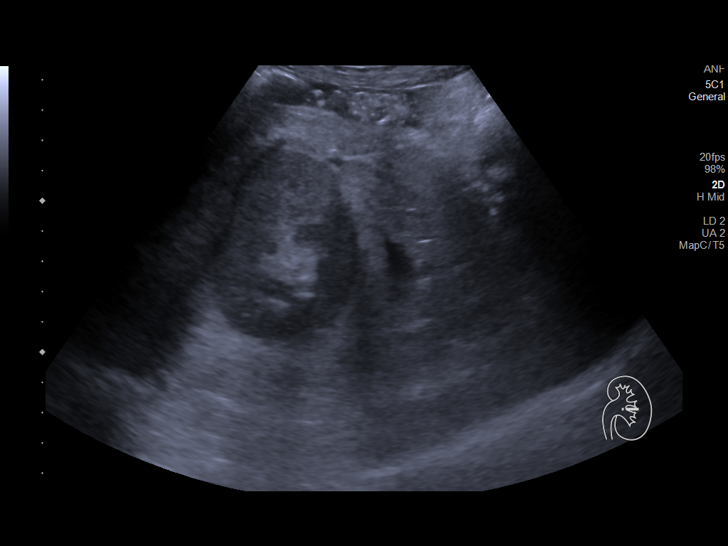
[im 23/26]
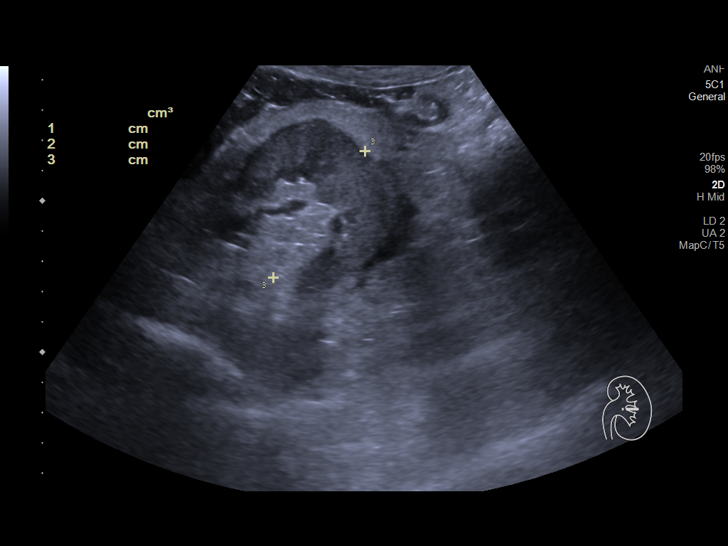
[im 26/26]
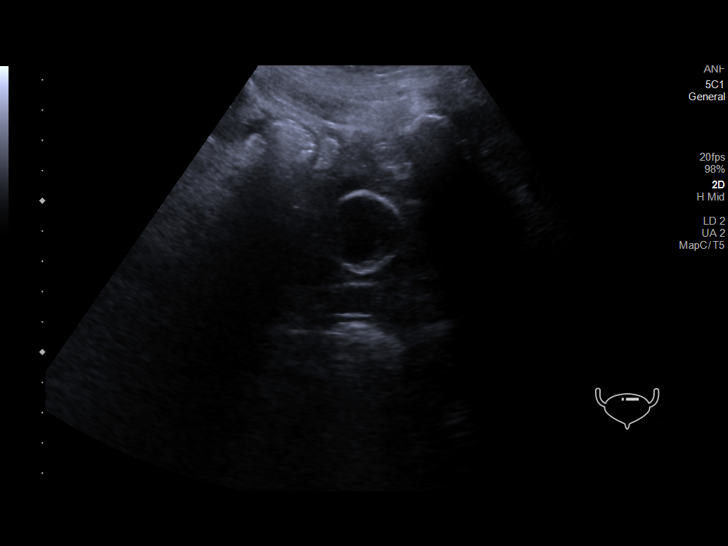

[14 of 25 positions shown; findings below may reference images not displayed]

FINDINGS: Right Kidney:

Renal measurements: 10.4 x 4.4 x 5.5 cm = volume: 132 mL. No
hydronephrosis. Diffusely increased parenchymal echogenicity.

Left Kidney:

Renal measurements: 9.7 x 4.5 x 5.2 cm = volume: 119 mL. No
hydronephrosis. Diffusely increased parenchymal echogenicity. Simple
cyst in the lower kidney measuring 1.5 cm. Small amount of
perinephric fluid.

Bladder:

Decompressed by Foley catheter and not well assessed.

Right pleural effusion and right upper quadrant ascites incidentally
noted.
IMPRESSION: 1. Increased bilateral renal echogenicity suggesting chronic medical
renal disease. No hydronephrosis.
2. Small left perinephric fluid, nonspecific but can be seen with
urinary tract infection.
